# Patient Record
Sex: Female | Born: 1945 | Race: White | Hispanic: No | Smoking: Never smoker
Health system: Southern US, Community
[De-identification: ages and names within clinical notes are randomized; demographics above are authoritative.]

## PROBLEM LIST (undated history)

## (undated) DIAGNOSIS — Z87442 Personal history of urinary calculi: Secondary | ICD-10-CM

## (undated) DIAGNOSIS — Z973 Presence of spectacles and contact lenses: Secondary | ICD-10-CM

## (undated) DIAGNOSIS — Z9289 Personal history of other medical treatment: Secondary | ICD-10-CM

## (undated) DIAGNOSIS — Z9221 Personal history of antineoplastic chemotherapy: Secondary | ICD-10-CM

## (undated) DIAGNOSIS — M1990 Unspecified osteoarthritis, unspecified site: Secondary | ICD-10-CM

## (undated) DIAGNOSIS — Z86711 Personal history of pulmonary embolism: Secondary | ICD-10-CM

## (undated) DIAGNOSIS — G5601 Carpal tunnel syndrome, right upper limb: Secondary | ICD-10-CM

## (undated) DIAGNOSIS — I2510 Atherosclerotic heart disease of native coronary artery without angina pectoris: Secondary | ICD-10-CM

## (undated) DIAGNOSIS — I350 Nonrheumatic aortic (valve) stenosis: Secondary | ICD-10-CM

## (undated) DIAGNOSIS — Z8673 Personal history of transient ischemic attack (TIA), and cerebral infarction without residual deficits: Secondary | ICD-10-CM

## (undated) DIAGNOSIS — I38 Endocarditis, valve unspecified: Secondary | ICD-10-CM

## (undated) DIAGNOSIS — I10 Essential (primary) hypertension: Secondary | ICD-10-CM

## (undated) DIAGNOSIS — Z86718 Personal history of other venous thrombosis and embolism: Secondary | ICD-10-CM

## (undated) DIAGNOSIS — Z8543 Personal history of malignant neoplasm of ovary: Secondary | ICD-10-CM

## (undated) DIAGNOSIS — I5189 Other ill-defined heart diseases: Secondary | ICD-10-CM

## (undated) HISTORY — PX: TONSILLECTOMY: SUR1361

## (undated) HISTORY — PX: VENTRAL HERNIA REPAIR: SHX424

## (undated) HISTORY — PX: UMBILICAL HERNIA REPAIR: SHX196

## (undated) HISTORY — PX: TRANSTHORACIC ECHOCARDIOGRAM: SHX275

---

## 1999-06-03 ENCOUNTER — Ambulatory Visit (HOSPITAL_BASED_OUTPATIENT_CLINIC_OR_DEPARTMENT_OTHER): Admission: RE | Admit: 1999-06-03 | Discharge: 1999-06-03 | Payer: Self-pay

## 1999-06-03 ENCOUNTER — Encounter (INDEPENDENT_AMBULATORY_CARE_PROVIDER_SITE_OTHER): Payer: Self-pay | Admitting: *Deleted

## 2001-02-16 HISTORY — PX: TOTAL ABDOMINAL HYSTERECTOMY W/ BILATERAL SALPINGOOPHORECTOMY: SHX83

## 2001-08-02 ENCOUNTER — Other Ambulatory Visit: Admission: RE | Admit: 2001-08-02 | Discharge: 2001-08-02 | Payer: Self-pay | Admitting: *Deleted

## 2001-08-04 ENCOUNTER — Inpatient Hospital Stay (HOSPITAL_COMMUNITY): Admission: RE | Admit: 2001-08-04 | Discharge: 2001-08-09 | Payer: Self-pay | Admitting: *Deleted

## 2001-08-04 ENCOUNTER — Encounter (INDEPENDENT_AMBULATORY_CARE_PROVIDER_SITE_OTHER): Payer: Self-pay

## 2001-08-04 ENCOUNTER — Encounter (INDEPENDENT_AMBULATORY_CARE_PROVIDER_SITE_OTHER): Payer: Self-pay | Admitting: *Deleted

## 2001-11-04 ENCOUNTER — Encounter: Payer: Self-pay | Admitting: Cardiovascular Disease

## 2001-11-04 ENCOUNTER — Observation Stay (HOSPITAL_COMMUNITY): Admission: EM | Admit: 2001-11-04 | Discharge: 2001-11-05 | Payer: Self-pay | Admitting: Emergency Medicine

## 2002-02-16 DIAGNOSIS — Z8673 Personal history of transient ischemic attack (TIA), and cerebral infarction without residual deficits: Secondary | ICD-10-CM

## 2002-02-16 HISTORY — DX: Personal history of transient ischemic attack (TIA), and cerebral infarction without residual deficits: Z86.73

## 2003-07-17 ENCOUNTER — Ambulatory Visit (HOSPITAL_COMMUNITY): Admission: RE | Admit: 2003-07-17 | Discharge: 2003-07-17 | Payer: Self-pay | Admitting: Emergency Medicine

## 2003-07-17 ENCOUNTER — Observation Stay (HOSPITAL_COMMUNITY): Admission: EM | Admit: 2003-07-17 | Discharge: 2003-07-18 | Payer: Self-pay | Admitting: Neurology

## 2003-07-17 ENCOUNTER — Encounter (INDEPENDENT_AMBULATORY_CARE_PROVIDER_SITE_OTHER): Payer: Self-pay | Admitting: Cardiology

## 2003-07-17 ENCOUNTER — Encounter: Payer: Self-pay | Admitting: Neurology

## 2003-12-19 ENCOUNTER — Ambulatory Visit: Payer: Self-pay | Admitting: Oncology

## 2004-06-12 ENCOUNTER — Ambulatory Visit: Payer: Self-pay | Admitting: Oncology

## 2004-12-11 ENCOUNTER — Ambulatory Visit: Payer: Self-pay | Admitting: Oncology

## 2004-12-24 ENCOUNTER — Ambulatory Visit (HOSPITAL_COMMUNITY): Admission: RE | Admit: 2004-12-24 | Discharge: 2004-12-24 | Payer: Self-pay | Admitting: Oncology

## 2005-06-11 ENCOUNTER — Ambulatory Visit: Payer: Self-pay | Admitting: Oncology

## 2005-06-12 LAB — CBC WITH DIFFERENTIAL/PLATELET
BASO%: 0.4 % (ref 0.0–2.0)
EOS%: 4.1 % (ref 0.0–7.0)
HCT: 37.8 % (ref 34.8–46.6)
LYMPH%: 41.6 % (ref 14.0–48.0)
MCH: 31.6 pg (ref 26.0–34.0)
MCHC: 34.1 g/dL (ref 32.0–36.0)
MCV: 92.7 fL (ref 81.0–101.0)
MONO%: 8 % (ref 0.0–13.0)
NEUT%: 45.9 % (ref 39.6–76.8)
lymph#: 1.6 10*3/uL (ref 0.9–3.3)

## 2005-06-12 LAB — COMPREHENSIVE METABOLIC PANEL
ALT: 24 U/L (ref 0–40)
AST: 22 U/L (ref 0–37)
Alkaline Phosphatase: 68 U/L (ref 39–117)
Chloride: 108 mEq/L (ref 96–112)
Creatinine, Ser: 0.7 mg/dL (ref 0.4–1.2)
Total Bilirubin: 0.6 mg/dL (ref 0.3–1.2)

## 2006-01-01 ENCOUNTER — Ambulatory Visit: Payer: Self-pay | Admitting: Oncology

## 2006-01-05 LAB — CBC WITH DIFFERENTIAL/PLATELET
BASO%: 0.4 % (ref 0.0–2.0)
MCHC: 34.6 g/dL (ref 32.0–36.0)
MONO#: 0.3 10*3/uL (ref 0.1–0.9)
RBC: 4.26 10*6/uL (ref 3.70–5.32)
WBC: 4.6 10*3/uL (ref 3.9–10.0)
lymph#: 1.9 10*3/uL (ref 0.9–3.3)

## 2006-01-05 LAB — CA 125: CA 125: 8.4 U/mL (ref 0.0–30.2)

## 2006-01-05 LAB — COMPREHENSIVE METABOLIC PANEL
ALT: 24 U/L (ref 0–35)
AST: 20 U/L (ref 0–37)
Calcium: 9.7 mg/dL (ref 8.4–10.5)
Chloride: 105 mEq/L (ref 96–112)
Creatinine, Ser: 0.66 mg/dL (ref 0.40–1.20)
Potassium: 3.7 mEq/L (ref 3.5–5.3)

## 2006-03-02 ENCOUNTER — Inpatient Hospital Stay (HOSPITAL_COMMUNITY): Admission: RE | Admit: 2006-03-02 | Discharge: 2006-03-05 | Payer: Self-pay | Admitting: General Surgery

## 2006-07-13 ENCOUNTER — Ambulatory Visit: Payer: Self-pay | Admitting: Oncology

## 2006-07-15 LAB — CBC WITH DIFFERENTIAL/PLATELET
Basophils Absolute: 0 10*3/uL (ref 0.0–0.1)
Eosinophils Absolute: 0.3 10*3/uL (ref 0.0–0.5)
HCT: 39.4 % (ref 34.8–46.6)
HGB: 14 g/dL (ref 11.6–15.9)
LYMPH%: 40.3 % (ref 14.0–48.0)
MCV: 90.3 fL (ref 81.0–101.0)
MONO#: 0.4 10*3/uL (ref 0.1–0.9)
MONO%: 7.2 % (ref 0.0–13.0)
NEUT#: 2.5 10*3/uL (ref 1.5–6.5)
Platelets: 290 10*3/uL (ref 145–400)
WBC: 5.2 10*3/uL (ref 3.9–10.0)

## 2006-07-15 LAB — COMPREHENSIVE METABOLIC PANEL
Albumin: 4.4 g/dL (ref 3.5–5.2)
Alkaline Phosphatase: 70 U/L (ref 39–117)
BUN: 14 mg/dL (ref 6–23)
CO2: 26 mEq/L (ref 19–32)
Glucose, Bld: 100 mg/dL — ABNORMAL HIGH (ref 70–99)
Total Bilirubin: 0.6 mg/dL (ref 0.3–1.2)
Total Protein: 7 g/dL (ref 6.0–8.3)

## 2006-07-15 LAB — CA 125: CA 125: 6.4 U/mL (ref 0.0–30.2)

## 2006-07-15 LAB — LACTATE DEHYDROGENASE: LDH: 212 U/L (ref 94–250)

## 2007-02-17 HISTORY — PX: LUMBAR FUSION: SHX111

## 2007-04-24 ENCOUNTER — Encounter: Admission: RE | Admit: 2007-04-24 | Discharge: 2007-04-24 | Payer: Self-pay | Admitting: Emergency Medicine

## 2007-06-03 ENCOUNTER — Inpatient Hospital Stay (HOSPITAL_COMMUNITY): Admission: RE | Admit: 2007-06-03 | Discharge: 2007-06-06 | Payer: Self-pay | Admitting: Neurosurgery

## 2007-11-20 IMAGING — CT CT ABD-PELV W/O CM
3 of 8 series · 12 of 42 positions shown, 18 images · IV contrast (CONTRAST)
Comparison: NONE

CLINICAL DATA: Abdominal pain and swelling.  Back pain. 

CT ABDOMEN AND PELVIS WITHOUT AND WITH INTRAVENOUS AND FOLLOWING 
ORAL  CONTRAST
TECHNIQUE: Multiple axial 5-millimeter thick slices at 
5-millimeter intervals were obtained from the lung base through 
the pelvis following the intravenous administration of 100 cc of 
Optiray 350 at a rate of 3 cc per second.  Oral contrast was 
administered as well.  Arterial and venous phase imaging was 
obtained in the upper abdomen with delayed images obtained through 
the pelvis.

[Series 4: venous · axial · portal-venous · 0.77mm/px · z∈[+574,+854]mm · 5 of 85 slices shown, 10 images]
[im 15/85  soft-tissue]
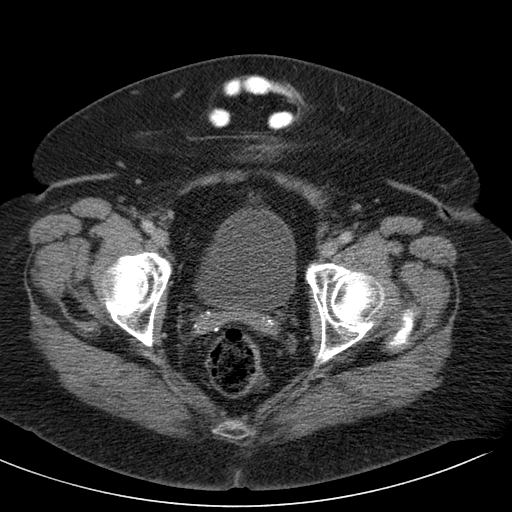
[im 15/85  bone]
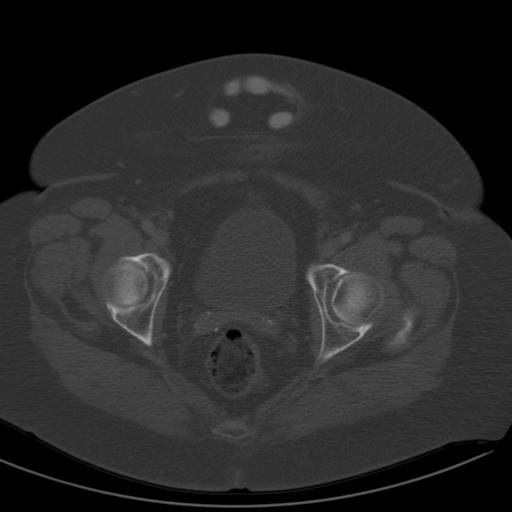
[im 29/85  soft-tissue]
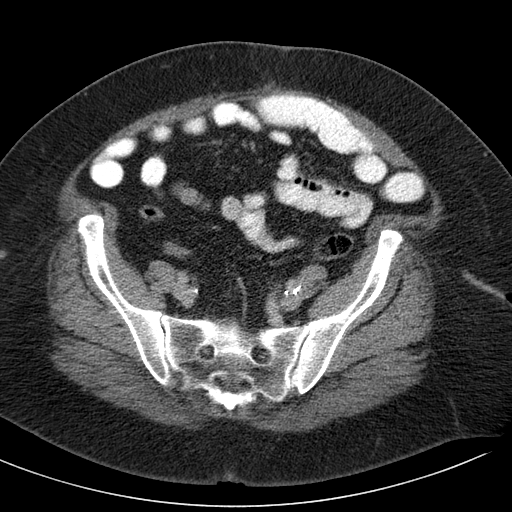
[im 29/85  lung]
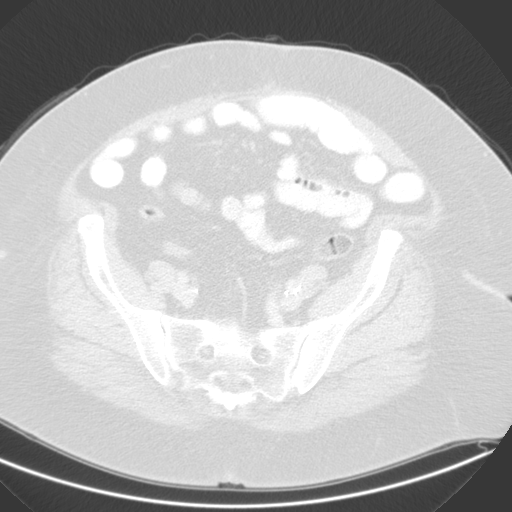
[im 43/85  soft-tissue]
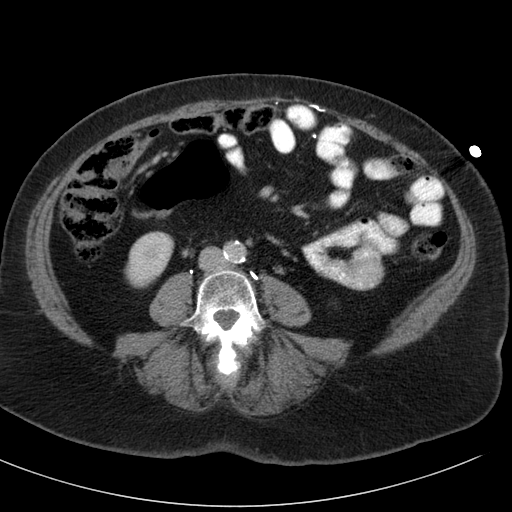
[im 43/85  lung]
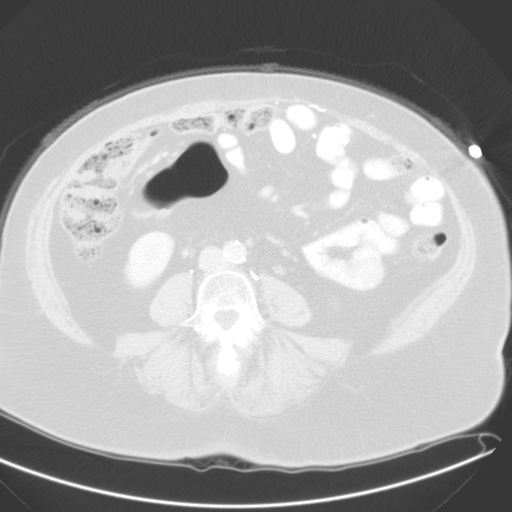
[im 57/85  soft-tissue]
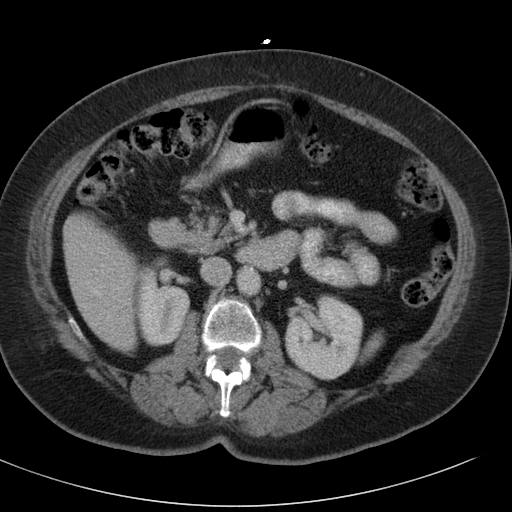
[im 57/85  lung]
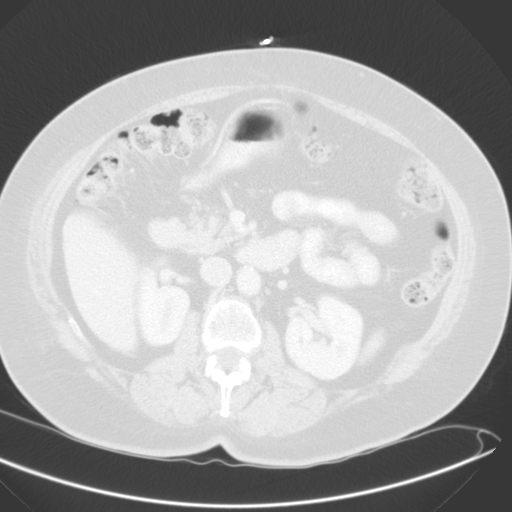
[im 71/85  soft-tissue]
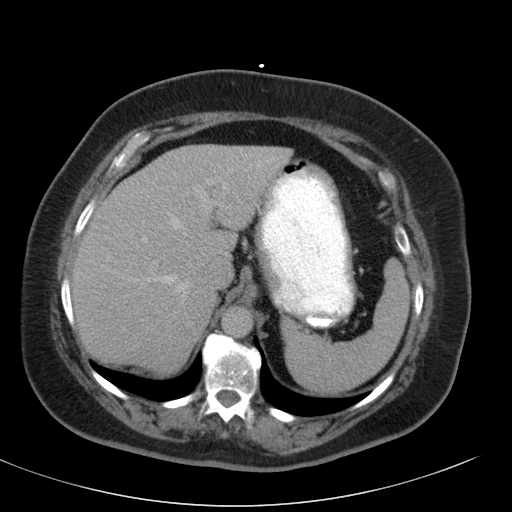
[im 71/85  lung]
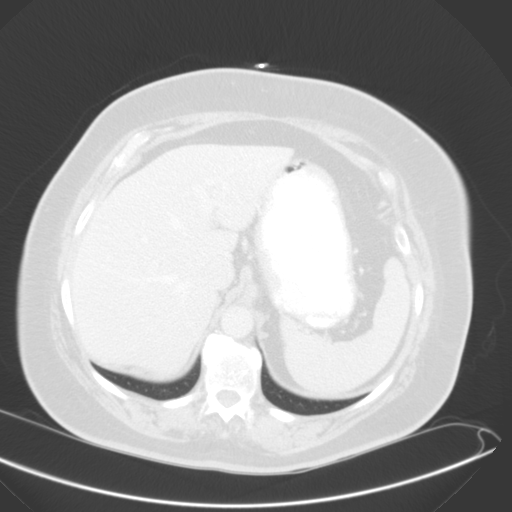

[Series 9: bladder delays · axial · 0.76mm/px · z∈[+606,+801]mm · 4 of 67 slices shown]
[im 14/67  soft-tissue]
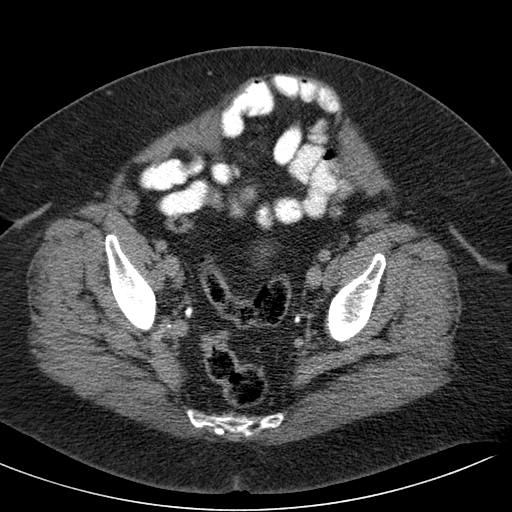
[im 27/67  soft-tissue]
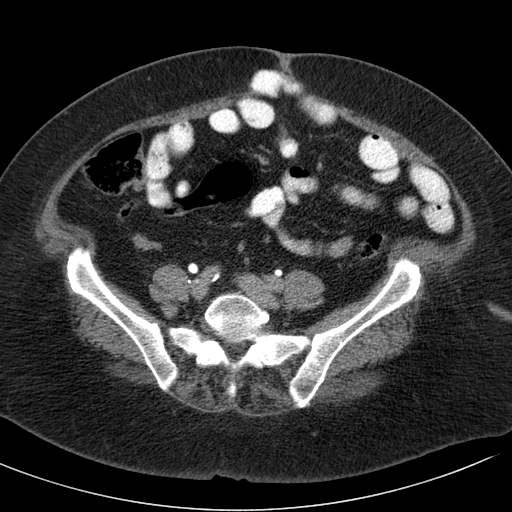
[im 40/67  soft-tissue]
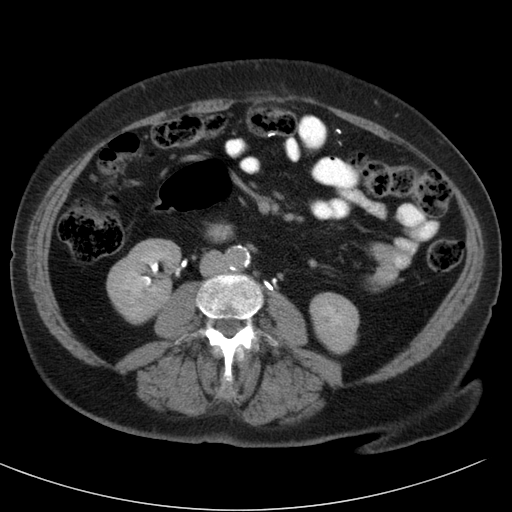
[im 53/67  soft-tissue]
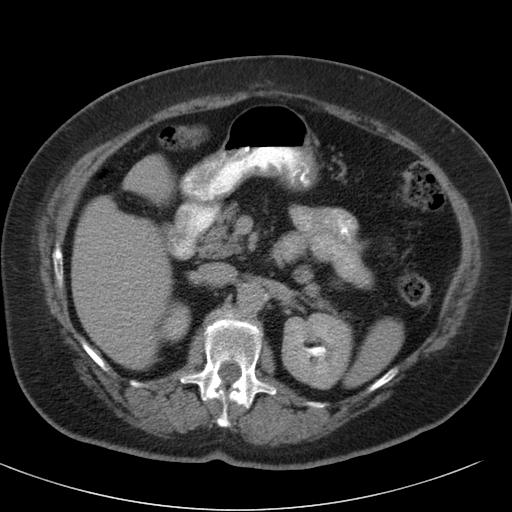

[Series 8058: coronals · coronal · 0.82mm/px · 3 of 95 slices shown, 4 images]
[im 32/95  soft-tissue]
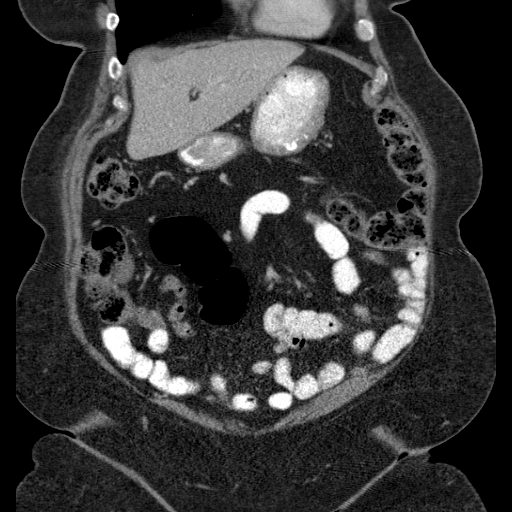
[im 42/95  soft-tissue]
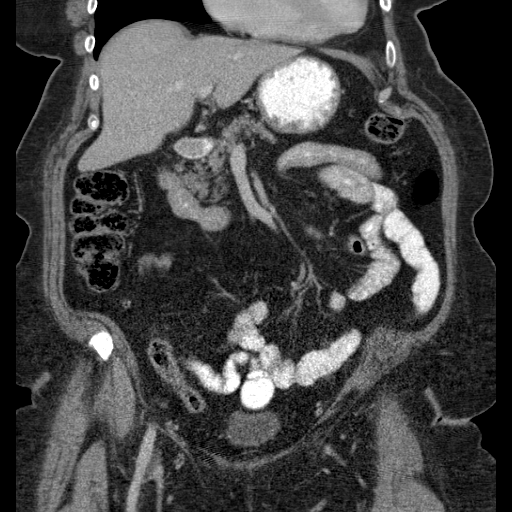
[im 42/95  bone]
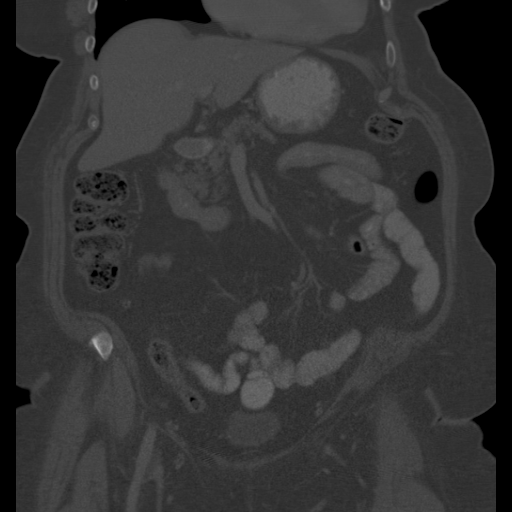
[im 53/95  soft-tissue]
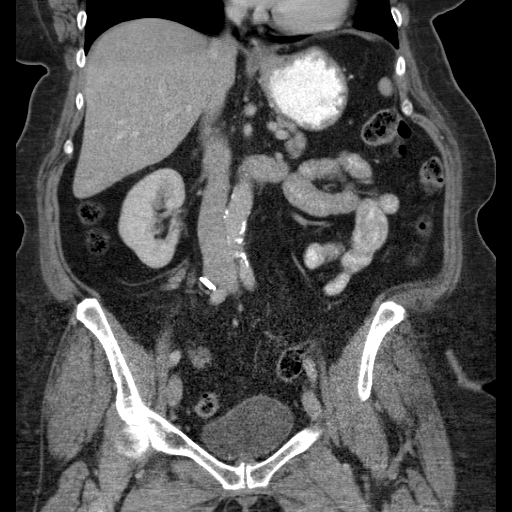

[12 of 42 positions shown; findings below may reference images not displayed]

FINDINGS: Lung bases are clear.  No acute bony changes are 
demonstrated.  Mild facet arthropathy is noted in the distal 
lumbar spine. The abdominal wall shows 3 separate areas of ventral 
herniation.  Above the umbilicus is an area of ventral hernia 
about 5 cm wide containing fat and a small amount of transverse 
colon.  A second umbilical hernia is about 5 cm wide and contains 
a loop of small bowel and omental fat.  A third ventral hernia 
inferiorly extends further inferiorly into the abdominal wall and 
pelvic pannus.  It has a width of about 10 cm and extends 
inferiorly for about 10-15 cm, containing several loops of small 
bowel and omental fat.  There is no evidence for obstruction or 
strangulation with any of the involved bowel. The gallbladder is 
surgically absent.  The liver, pancreas, and spleen appear normal. 
The kidneys show symmetrical excretion.  No solid renal masses, 
stones, or hydronephrosis are evident.  The ureters are normal in 
course and caliber.  Urinary bladder appears normal. The uterus 
and ovaries are not visible, presumably absent.  The large and 
small bowel loops show no mass, obstruction, or inflammation.  A 
normal appendix lies inferior to the cecum.  No evidence for 
inflammatory bowel disease or diverticulitis.
IMPRESSION: Post-op changes.  Anterior abdominal wall hernias at 
several locations as described.  No acute abnormality. Savio Locklear 
01/27/2006  Tran Date: 01/28/2006 [facility]  JLM

## 2010-07-01 NOTE — Op Note (Signed)
NAMETANETTA, FUHRIMAN                   ACCOUNT NO.:  [id number]   MEDICAL RECORD NO.:  [id number]          PATIENT TYPE:  INP   LOCATION:  3003                         FACILITY:  [facility]   PHYSICIAN:  Hilda Lias, M.D.   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   PROCEDURE:  L4 Gill procedure, bilateral total diskectomy, interbody  fusion with cages, pedicle screws L4-L5, posterolateral arthrodesis with  autograft, and BMP, cell saver, C-arm.   SURGEON:  Hilda Lias, M.D.   ASSISTANT:  Dr. Lovell Sheehan.   CLINICAL HISTORY:  The patient is being admitted because of back pain  that rose to both legs.  The patient has been complaining of pain and  weakness on walking.  X-rays reveal spondylolisthesis with stenosis at  L4-L5.  Surgery was advised, and the risks were explained in the history  and physical.   PROCEDURE:  The patient was taken to the OR and after intubation, the  back was cleaned with DuraPrep.  Midline incisional x-ray was taken,  which showed that we were below the L4-L5.  From then on midline  incision was made through the skin through a thick adipose tissue down  to the lumbar area.  We identified the L5-S1 space and we went one space  above to identify the L4-L5 space.  Another x-ray showed indeed we were  at the L4-L5.  From then on we proceeded with removal of the spinous  process and lamina of the facet of L4.  The patient has a thick  calcified yellow ligament.  The dural sac was quite narrow.  Decompression was made and after the lysis of adhesion, we were able to  mobilize the dural sac and  entered the disk space with total bilateral  gross diskectomy was achieved.  Then we introduced 2 cages 12 x 22 with  a BMP anteriorly and autograft in the medial and posterior.  Those 2  cages were centered.  AP and lateral showed good position of the bone  cages.  From then on with the C-arm, we identified the pedicle at L4 and  L5 and we introduced 4 screws of 5.5 x 45, which were kept in place  using a rod with caps.  Then, we went lateral and we did the lateral  aspect of the facet and a mix of BMP and autograft was used for  arthrodesis.  From then on, the wound was closed with Vicryl and Steri-  Strips.  The patient will be sent to the recovery room.           ______________________________  Hilda Lias, M.D.     EB/MEDQ  D:  06/03/2007  T:  06/04/2007  Job:  [id number]

## 2010-07-04 NOTE — Op Note (Signed)
[facility]  Patient:    Hailey Shields, Hailey Shields Visit Number: [id number] MRN: [id number]          Service Type: GYN Location: 4W [id number] [id number] Attending Physician:  Marin Comment Dictated by:   Pershing Cox, M.D. Proc. Date: 08/04/01 Admit Date:  08/04/2001   CC:         Genia Del, M.D.  Mardene Celeste Lurene Shadow, M.D.  Elgie Congo, M.D., [facility]   Operative Report  PREOPERATIVE DIAGNOSES: 1. Complex pelvic abdominal mass. 2. Symptomatic gallstones.  POSTOPERATIVE DIAGNOSES: 1. 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 3. Rule out ovarian cancer.  PROCEDURE: 1. Exploratory laparotomy 2. Lysis of adhesions. 3. Total abdominal hysterectomy. 4. Bilateral salpingo-oophorectomy. 5. Omentectomy. 6. Pelvic lymphadenectomy.  SURGEON:  Pershing Cox, M.D.  ASSISTANT SURGEON:  Genia Del, M.D.  INDICATIONS FOR PROCEDURE:  This 65 year old woman has a history of intermittent bowel pressure and pain over the last two years.  She experienced postmenopausal bleeding and was seen by Dr. Cleta Alberts at [facility].  He obtained a CT scan and an ultrasound of her pelvis, both showing complex pelvic mass suggestive of ovarian neoplasm which was compressing both the uterus and the bladder.  CA-125 was 86.  Prior to surgery, the patient was counseled regarding the planned procedure.  All of the risks were explained, and she accepted the risks of this procedure.  She was also seen in consultation by Dr. Dominga Ferry prior to surgery, who has agreed to perform a cholecystectomy at the time of our exploratory laparotomy which was removed for symptomatic gallstones.  She underwent extensive bowel preparation prior to surgery and received IV antibiotics prior to her surgical procedure.  OPERATIVE FINDINGS:  The patient had a hernia sac present in the anterior abdominal wall, consistent with  recurrence or entrapment of her previous umbilical hernia repair.  She had a 20 cm mass oblong in character, both cystic and solid, arising from the left ovary.  This mass was densely adherent to the cul-de-sac of Douglas and to the posterior surface of the uterus and cervix.  It could not be separated from the uterus and, for this reason, an unblocked dissection was required to remove the pelvic mass rather than a routine hysterectomy.  The patients right fallopian tube and ovary were elongated but otherwise normal.  The cervix was elongated.  There was no evidence of ascites.  There were no palpable lymph nodes in the pelvis or the periaortic area.  The omentum was normal in texture.  There were no peritoneal implants.  DESCRIPTION OF PROCEDURE:  The patient was brought to the operating room with an IV in place.  She had received 1 g of Ancef in the holding area.  She had PAS stockings placed on her lower extremities.  She was placed supine on the OR table, and IV sedation was administered.  She was then intubated without difficulty.  She was placed in a frogleg position, and the anterior abdominal wall, perineum, and vagina were prepped with a solution of Hibiclens.  Foley catheter was inserted sterilely into the bladder.  She was placed again in a supine position and draped for a midline incision.  Marcaine 0.25% was injected into the midline circumferentially around the right side of the umbilicus, extending nearly to the xiphoid.  We began our incision just above the umbilicus, carrying it down to the skin and to the dermis.  Just lateral to the  umbilicus, a soft, fleshy, cystic area was noted which appeared to be a loop of bowel.  For this reason, the incision was extended above the previous incision, carried down through the skin until the fascia was encountered.  The fascia was opened, peritoneum was scored, and I entered above the umbilicus approximately 8 cm.  From this  perspective, I could lift the anterior abdominal wall and continue the dissection down.  There was no evidence of bowel coming through the anterior abdominal wall.  Later in the operative procedure, when we were able to dissect this out, this was an entrapped cystic area, probably residual hernia sac from her previous umbilical hernia repair. The skin incision, subcutaneous incision, and fascial incisions were extended down to the bottom of her pannus.  I was able to avoid making the incision into the area where she had had previous inflammation suggestive of a yeast dermatitis.  Anterior abdominal walls were lifted, and 500 cc of warm saline were instilled and retrieved for peritoneal washings.  Next, upper abdominal exploration was performed.  I then ran the bowel and palpated the lymph node areas, looking for any signs of enlarged lymph node.  At this point, the bowel was packed into the gutters as best as possible.  Bookwalter retractor was placed, and retractors were used to give [place] exposure over the area of the upper abdomen/mid pelvis.  The mass that we were after was arising from the left ovary.  It was both cystic and solid, and many of the cystic areas were very thin-walled.  The mass was densely adherent to the posterior surface of the uterus.  It was impossible by either approaching from the sides or from the top of the fundus to find a space between the ovary and the uterus.  At this point, we began by identifying the left round ligament.  This was ligated and transected.  Peritoneum lateral to the IP ligament was incised. The IP ligament was reflected laterally, and we were then able to palpate the ureter.  The clear space beneath the IP ligament was identified and punctured. The IP ligament was clamped doubly, cut, suture, and free-tie ligated.  We dissected this pedicle for a distance to lift it off of the lateral sidewall.  With this ligated, we then began to try to lift  the mass out of the pelvis. There was a very stiff band of tissue at the base where the ovary was adjacent to the descending rectosigmoid.  In an attempt to expose this band of tissue, one of the cystic areas was ruptured.  The fluid was suctioned.  Attempt was made to try to contain the liquid without exposing the entire peritoneal cavity, but we were unsuccessful.  This fluid was straw-colored and measured over a liter by the time the fluid had been completely drained.  With the cystic areas of the mass decompressed, we were able to see the band beneath and, after verifying that both ureters were safely distant from this, it was able to be incised.  We were then able to lift the posterior portion of the mass; however, it was still impossible to separate it from the posterior wall of the uterus.  At this point, we decided to perform a hysterectomy with en bloc resection of this ovarian mass.  Right round ligament was identified, suture ligated, and transected.  The peritoneum lying along the ovarian vessels was incised.  Vessels were reflected medially, and the ureter was visualized.  The IP ligament  was then safely clamped, cut, suture, and free-tie ligated.  The posterior broad ligament was incised bringing the ovary close to the body of the uterus.  The uterine artery was skeletonized on the right side.  The peritoneum over the lower uterine segment was incised, and the bladder was dissected free from the cervix.  Posteriorly, a small amount of dissection was done to free the rectosigmoid from the posterior surface of the uterus so that the uterine arteries could be safely clamped.  This was clamped, cut, and then Heaney-ligated.  A second clamp was placed along the uterine artery which was then doubly tied using the second stitch around the first stitch.  Straight clamps were used to separate the cervix from the cardinal ligament. Approximately four bites were necessary in order to  make the separation as the cervix was quite elongated.  Dissection on the right was very similar to that on the left.  Curved Zeppelin clamps were used to clamp beneath the cervix, and the cervix was incised from the vagina.  There was a small amount of adhesive disease still from the rectosigmoid to the surface of the uterus. Using care, Metzenbaum scissors were used to free this before the entire specimen was lifted from the pelvis.  Angled stitches were placed along the vaginal angles, and the anterior and posterior portions of the vagina were whipped using a running locking 0 Vicryl stitch.  The vaginal walls were closed front to back using two 0 Vicryl sutures.  Hemostasis was excellent at this point.  Report from the pathologist regarding the frozen section showed a tumor which was primarily borderline, perhaps mucinous, arising in a cystic teratoma. There were areas suggestive but no diagnostic of ovarian cancer.  Final pathology will be necessary to resolve this question.  Because of this borderline diagnosis, a decision was made to proceed with ovarian cancer staging.  Our staging was somewhat limited by the patients extreme obesity and the large amount of fat in her retroperitoneum.  Where exposure was adequate, the sampling was complete where the exposure was inadequate. Attempts were limited or abandoned.  Using an LDS stapler, the gastrocolic omentum was separated from the transverse colon.  The upper layer was removed first by sharp dissection with cautery and Metzenbaum scissors.  The deeper layer of omentum was then separated using the LDS stapler.  This was submitted as a single specimen.  Portions of peritoneum were incised from both gutters. These were sent for permanent section.  Lymph node sampling was performed on both sides of the pelvis.  The external iliac artery was exposed to the level of the circumflex vein.  Hypogastric artery was clearly visualized, and  there was very little nodal tissue.  There were lots of areas of fat around the arteries and in the retroperitoneum, but there was very little lymph node tissue present.  What was visible was removed from the top of the external iliac and hypogastric artery.  The artery was then separated from the vein, and the tissue obtained between these two vessels was also submitted.  Moving up the common iliac artery, a bundle of tissue was taken from the lateral surface of the iliac artery on the patients left.  We attempted to gain exposure to perform a periaortic node dissection on the left.  This was abandoned because of extreme difficulty with exposure. Given the uncertain diagnosis, I did not push any harder.  I did carefully palpate these areas, and there was certainly no evidence of  palpable lymph node, either on the left or the right side of the periaortic chains.  On the patients right, there was almost no nodal tissue to sample.  I did remove the fat pad along the external iliac artery and right common iliac artery.  Both arteries were separated from the psoas muscle and carefully palpated.  Both arteries were separated from the vein completely, and the vein and hypogastric vessels completely exposed.  No tissue was palpated posterior or inferior to the external iliac vein.  On the patients right, the peritoneum lateral to the ascending colon was completely incised, and the colon was reflected medially so that I could carefully visualize the inferior vena cava and palpate this area.  There was just nothing to sample in this area.  Drains were not placed because of the limited nature of the dissection.  At this point, Dr. Dominga Ferry came into the operating room and performed an open cholecystectomy.  Please see notes related to his procedure which will be dictated separately.  Once the cholecystectomy was completed, we inspected the pelvis again carefully and found no evidence of  bleeding.  Three liters of warm saline were used to irrigate the peritoneal cavity vigorously, hoping to retrieve any floating malignant cells which might have escaped from the ovary during our  dissection and rupture the cyst.  The rectosigmoid was carefully layered into the pelvis.  Small bowel was layered on top of it, and the transverse colon was drawn down over the top of this.  Inspection of the anterior abdominal wall showed the area of retained hernia sac.  This was carefully dissected from the subcutaneous tissues.  The fascia was freshened.  The fascia was closed with double-stranded 0 Prolene sutures, beginning each stitch at the upper or lower portion of the incision and running them to the middle of the incision where the stitch was inverted.  Subcutaneous tissues were irrigated and made hemostatic.  Skin staples were applied.  Estimated blood loss 750 cc.  Urine output 600 cc.  Fluid 5250 cc.  Specimens included peritoneal washings, en bloc resection of the uterus, complex left adnexal mass, and right fallopian tube and ovary, omentum, left pelvic lymph nodes, and right pelvic lymph nodes and, with Dr. Lurene Shadow, the gallbladder. Dictated by:   Pershing Cox, M.D. Attending Physician:  Marin Comment DD:  08/04/01 TD:  08/06/01 Job: [id number]

## 2010-07-04 NOTE — Op Note (Signed)
. [facility]  Patient:    Hailey Shields,                                 MRN: [id number] Proc. Date: 06/03/99 Attending:  Milus Mallick, M.D.                           Operative Report  NO DICTATION DD:  06/03/99 TD:  06/03/99 Job: [id number]

## 2010-07-04 NOTE — Op Note (Signed)
. [facility]  Patient:    Hailey Shields, Hailey Shields                            MRN: [id number] Proc. Date: 06/03/99 Adm. Date:  06/03/99 Attending:  Milus Mallick, M.D.                           Operative Report  NO DICTATION PROVIDED DD:  06/03/99 TD:  06/03/99 Job: [id number]

## 2010-07-04 NOTE — Discharge Summary (Signed)
Hailey Shields, Hailey Shields                   ACCOUNT NO.:  [id number]   MEDICAL RECORD NO.:  [id number]          PATIENT TYPE:  INP   LOCATION:  3003                         FACILITY:  [facility]   PHYSICIAN:  Hilda Lias, M.D.   DATE OF BIRTH:  09-13-1945   DATE OF ADMISSION:  06/03/2007  DATE OF DISCHARGE:  06/06/2007                               DISCHARGE SUMMARY   ADMISSION DIAGNOSIS:  L4-L5 spondylolisthesis with chronic  radiculopathy.   FINAL DIAGNOSIS:  L4-L5 spondylolisthesis with chronic radiculopathy.   CLINICAL HISTORY:  The patient was admitted because of back pain  radiating to both legs.  X-rays showed that she had stenosis at the L4-  L5 and surgery was advised.   LABORATORY:  Normal.   HOSPITAL COURSE:  The patient was taken to surgery on April 17 and L4-L5  fusion was accomplished.  The patient did well up to the point that 3  days later she was ready to go home.  She was discharged by Dr. Lovell Sheehan  and there is a formal written discharge in the chart.   CONDITION ON DISCHARGE:  Improvement.   MEDICATIONS:  1. Percocet.  2. Diazepam.   DIET:  Regular.   ACTIVITY:  She is not to drive until she sees me.           ______________________________  Hilda Lias, M.D.     EB/MEDQ  D:  06/28/2007  T:  06/29/2007  Job:  [id number]

## 2010-07-04 NOTE — Cardiovascular Report (Signed)
NAME:  Hailey Shields, Hailey Shields                             ACCOUNT NO.:  [id number]   MEDICAL RECORD NO.:  [id number]                   PATIENT TYPE:  INP   LOCATION:  1824                                 FACILITY:  [facility]   PHYSICIAN:  Richard A. Alanda Amass, M.D.          DATE OF BIRTH:  [birth year]   DATE OF PROCEDURE:  11/04/2001  DATE OF DISCHARGE:                              CARDIAC CATHETERIZATION   PROCEDURE:  Retrograde central aortic catheterization, selective coronary  angiography by Judkins technique, left ventricular angiogram RAO and LAO  projections, intracoronary nitroglycerin administration, left ventricular  angiogram, RAO and LAO projections, abdominal aortic angiogram, hand  injection mid stream.   DESCRIPTION OF PROCEDURE:  The patient was brought to the second floor CP  Lab in the postabsorptive state after premedication with 5 mg of Valium p.o.  Heparin was held.  She was given 2 mg of Versed for sedation.  During the  procedure she was given 5 mg of Lopressor IV for sinus tachycardia.  Catheterization was done through a 6 [place] short Daig side-arm sheath that  was placed in the CRFA using modified Seldinger technique with a single  anterior puncture using an 18 thin wall needle.  IC nitroglycerin, 200 mcg  was given at the left coronary artery with repeat injections obtained.  Following coronary angiography, LV angiogram was done in the RAO and LAO  projections at 25 cc 14 cc/sec., 20 cc 12 cc/sec. respirations.  Pullback  pressure of the CA was performed that showed no gradient across the aortic  valve. Abdominal angiogram was done in the mid stream PA projection by hand  injection, demonstrating normal single renal arteries bilaterally, normal  infrarenal abdominal aorta.   Hand injection of the left subclavian revealed a normal subclavian, normal  left vertebral, normal LIMA.   Catheter was removed, side-arm sheath was flushed.  ACT was less than 150.  The patient  was transferred to the holding area for sheath removal and  pressure hemostasis. She tolerated the procedure well and had intact right  lower extremity pulses at the end of the procedure.   PRESSURES:  LV:  Initially blood pressures were 160-170, post IC  nitroglycerin 140/0; LVEDP 16 mmHg.  CA:  140/74 mmHg.   FLUOROSCOPY:  Fluoroscopy showed minimal +1 calcification at the proximal  LAD and right coronary. There was no intracardiac or valvular calcification.   LEFT VENTRICULAR ANGIOGRAM:  The left ventricular angiogram in the RAO and  LAO projection showed normal contracting left ventricle, EF greater than  550% with no mitral regurgitation.  No segmental wall motion abnormality.   The main left coronary was normal.   The left anterior descending artery had an eccentric 50-60% (closer to 60%)  lesion just beyond the first diagonal and just before the first septal  perforator. There was segmental disease of the LAD with about 40% narrowing  proximal to the first diagonal.  The plaque was eccentric to the ostia of the  diagonal #1, smooth with good flow. The remainder of the LAD coursed near  the apex of the heart and had no significant stenosis.   The first diagonal branch arising from the eccentric LAD lesion showed no  significant stenosis, was of small to moderate sized and bifurcated.   The second diagonal branch arising from the mid LAD was a large bifurcating  twin diagonal branch as large as the LAD and coursed in the lateral apical  wall.   The circumflex artery was a large codominant vessel giving off the small OM-  1 that was normal and AV groove branch at the bend right in the proximal  third.  A normal sized second marginal and a large bifurcating distal  marginal branch with two PAVG groove branches that were normal.   The right coronary was a dominant vessel and was large. There was  atherosclerotic irregularity and 30-40% segmental narrowing throughout the   proximal third after the atrial and before the RV branch. There was good  flow and there was minimal irregularity in the mid RCA.  The PDA and PLA  were normal.   DISCUSSION:  This very pleasant, 65 year old married white mother of three  with three grandchildren is a non smoker. She recently underwent abdominal  surgery by Dr. Carey Bullocks and Dr. Lurene Shadow (cystectomy) for CT evidence of  possible ovarian tumor. 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  She tolerated surgery well and had associated  cholecystectomy.  She has a prior history of mild GERD but none recently.  She has started on chemotherapy and had three treatments (last one a week  ago) under the care of Dr. Donnie Coffin one day a month.  She takes no regular  medicine except p.r.n. medicine around her chemotherapy which she has  tolerated well.  She has lost her hair.  She has had two episodes of new  onset substernal chest pain radiating to both arms lasting 20 minutes on the  a.m. prior to admission and a second one at work on the day of admission,  both compatible with ischemia.  Initial enzymes were normal with no acute  ECG changes.   The patient has new onset angina.  She has just been through a stressful  period in her life and has had fairly major surgery almost three months ago  which is well-healed.  She does have 50-60% eccentric narrowing  atherosclerotic of the LAD just beyond the first diagonal branch. There is a  lesion that crosses over the diagonal and septal perforator. The remainder  of the coronaries show no significant disease and she has normal LV  function.   In view of her new onset angina which is probably related to her LAD lesion,  I would recommend medical therapy for single vessel disease with borderline  LAD stenosis angiographically.  I would also institute empiric GI therapy  considering the stress the patient has been under with the possibility of reflux and/or esophageal  spasm. Correction of risk factors if possible, such  as obesity (no diabetes), and hyperlipidemia will be investigated. I would  recommend outpatient Cardiolite to assess her further for any anterior  ischemia since she would be a potential candidate for intervention if  clinically necessary in the future.   CATHETERIZATION DIAGNOSES:  1. Chest pain, new onset angina without myocardial infarction.  2. Borderline left anterior descending lesion, eccentric, segmental near  a     diagonal #1 and status post #1 as outlined, probably culprit.  3. Minor atherosclerotic proximal right coronary artery disease.  4. Normal left ventricular function.  5. Recent clear-cell ovarian cancer, treated surgically, June 2003 with     associated cholecystectomy.  Subsequently monthly chemotherapy under the     direction of Dr. Pierce Crane.  6. Exogenous obesity.  7. Hyperlipidemia, lab pending.  8. Mild hypertension.                                                 Richard A. Alanda Amass, M.D.    RAW/MEDQ  D:  11/04/2001  T:  11/07/2001  Job:  [id number]   cc:   Pierce Crane, M.D.  [address] [facility]  [place]  [place] [address]  Fax: [phone]   Brett Canales A. Cleta Alberts, M.D.   Mardene Celeste Lurene Shadow, M.D.  [address], [address]  [place]  [place] [address]  Fax: [phone]   Pershing Cox, M.D.  [address]  [address]  [place]  [place] [address]  Fax: [phone]   [facility]   Cristy Hilts. Jacinto Halim, M.D.

## 2010-07-04 NOTE — Op Note (Signed)
Hailey Shields, Hailey Shields                   ACCOUNT NO.:  [id number]   MEDICAL RECORD NO.:  [id number]          PATIENT TYPE:  INP   LOCATION:  X007                         FACILITY:  [facility]   PHYSICIAN:  Sharlet Salina T. Hoxworth, M.D.DATE OF BIRTH:  10-Feb-1946   DATE OF PROCEDURE:  03/02/2006  DATE OF DISCHARGE:                               OPERATIVE REPORT   PREOPERATIVE DIAGNOSIS:  Ventral incisional hernia.   POSTOPERATIVE DIAGNOSIS:  Ventral incisional hernia.   SURGEON:  Dr. Johna Sheriff   ANESTHESIA:  General.   BRIEF HISTORY:  Hailey Shields is a 65 year old female with morbid obesity  who is status post extensive laparotomy for ovarian cancer in 2003.  She  remains cancer free but has developed intermittent discomfort, bulging,  and swelling in her midline incision, both in the lower and upper  abdomen.  Examination shows a large ventral incisional hernia involving  several areas of her midline incision.  CT scan confirms this diagnosis  with a large defect of the lower abdomen and several smaller defects  along the upper midline.  No other abnormalities were found on the CT.  After extensive discussion of options, we have elected to proceed with  possible laparoscopic versus open repair, depending on the findings.  The nature of the procedure; indications, risks of bleeding, infection,  recurrence, and bowel injury were discussed understood.  She is now  brought to the operating room for this procedure.   DESCRIPTION OF OPERATION:  The patient was brought to the operating  room, placed in supine position on the operating table and general  endotracheal anesthesia was induced.  Foley catheter was placed.  She  was given preoperative antibiotics.  She was given Lovenox  subcutaneously preoperatively.  The abdomen was widely sterilely prepped  and draped.  PAS were placed.  Correct patient and procedure were  verified.  Local anesthesia was used to infiltrate the trocar sites  prior to the  incisions.  In the left flank, 1 cm incision was made and  dissection carried down directly to the anterior fascia which was  incised for 1 cm, the internal oblique bluntly split, the transversus  peritoneum elevated and divided under direct vision, and the free  peritoneal cavity entered.  Through a mattress sutures of 0 Vicryl, the  Hasson trocar was placed and pneumoperitoneum established.  There were  multiple adhesions up to the midline and into the hernias, both omentum  and bowel, but these did not appear particularly severe, and we elected  proceed with a laparoscopic approach.  Two 5 mm trocars were placed in  the extreme left lateral abdomen for dissection.  Using careful  dissection under direct vision with sharp dissection and the harmonic  scalpel, numerous adhesions were taken down from the midline and reduced  from the hernia defects.  This included omentum and small bowel.  The  larger defects in the abdomen did not actually have any adhesions up  into them.  The dissection was fairly prolonged and tedious but  progressed without problem until all adhesions were completely lysed.  The bowel was  then carefully run and examined.  There was no evidence of  bowel injury.  There were, as noted, multiple defects along the midline  with a large defect in the lower abdomen and several smaller defects  into the epigastrium.  In order to cover the entire area, we measured a  piece of mesh 30 x 20 cm was required, and the size of piece of Proceed  dual-sided mesh was chosen.  Eight 0 Prolene stay sutures were placed  circumferentially around the edge of the mesh.  Corresponding small stab  incisions were made in the anterior abdominal wall to bring up the stay  sutures.  Mesh was curled, placed in the abdomen, and unfurled and  oriented.  Using the suture retriever, the stay sutures were brought up  through the anterior abdominal wall and then the mesh brought up with  nice deployment  widely around the defects.  The stay sutures were tied  in place.  The mesh was then tacked circumferentially using the Endo  tacker feeling all the tacks directly through the abdominal wall.  In  order to visualize the left side of the mesh, as this was a large, wide  piece of mesh, I did place two 5 mm trocars in the right flank under  direct vision and then used a 5 mm camera and Endo tacker through the  right side to tack the left side of the mesh.  A second inner row of  tacks was then also placed.  This appeared to widely cover all defects  with secure placement of the mesh.  Following this, CO2 was evacuated,  trocars removed, and the mattress suture was secured in the left flank.  An additional Vicryl suture was used to close this fascial defect as  well.  Skin incisions were then closed with interrupted subcuticular 4-0  Monocryl and Dermabond.  Sponge, needle, and instrument counts were  correct.  Dry sterile dressings were applied and the patient taken to  recovery in good condition.      Lorne Skeens. Hoxworth, M.D.  Electronically Signed     BTH/MEDQ  D:  03/02/2006  T:  03/02/2006  Job:  [id number]

## 2010-07-04 NOTE — Discharge Summary (Signed)
[facility]  Patient:    Hailey Shields, EMPIE Visit Number: [id number] MRN: [id number]          Service Type: GYN Location: [id number] [phone] [id number] Attending Physician:  Marin Comment Dictated by:   Pershing Cox, M.D. Admit Date:  08/04/2001 Discharge Date: 08/09/2001   CC:         Luisa Hart L. Lurene Shadow, M.D.  Pierce Crane, M.D.  Genia Del, M.D.  Dr. Lesle Chris, [facility]   Discharge Summary  ADMITTING DIAGNOSES:  Complex pelvic abdominal mass and symptomatic gallstones.  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  OPERATIVE PROCEDURES:  Exploratory laparotomy with total abdominal hysterectomy, bilateral salpingo-oophorectomy, omentectomy and staging for ovarian carcinoma, open cholecystectomy.  HISTORY OF PRESENT ILLNESS:  For details of the patients history and physical, please see the transcribed note dated August 04, 2001.  Briefly, this patient was referred from Dr. Cleta Alberts at [facility] because of CAT scan findings of a complex abdominal pelvic mass.  The patient had presented with postmenopausal bleeding when she had these studies done.  She received a bowel prep in preparation for this operation and saw Dr. Dominga Ferry in consult because of symptomatic gallstones and the need for extensive incision thinking that it would be best for her to have the cholecystectomy at the same time as our operation.  HOSPITAL COURSE:  The patient was taken to the operating room on the day of admission.  She was found to have a very large complex pelvic abdominal mass which on frozen section turned out to be a borderline tumor arising in the cystic teratoma, final pathology showed this to be a clear cell carcinoma of the ovary.  Because it ruptured during dissection, it is a I-C clear cell carcinoma of the ovary.  Staging for ovarian carcinoma was performed at the time of her surgical procedure.   At the same time Dr. Dominga Ferry came to the operating room and performed an open cholecystectomy because of her multiple gallstones.  The patients postoperative course was relatively uncomplicated.  She was seen the night of her surgery and was having no significant complaints.  Her pain was fairly well controlled.  On the morning of postoperative day #1, the patient began taking Toradol and was able to begin sips of liquids.  She had been in the step-down unit overnight but was transferred to the floor, Foley catheter was discontinued and she was placed on clear liquids.  She had a low-grade temperature that day but as she began using her inspirometer well this temperature was controlled.  Also, she had had some leaking from the lower part of her incision.  The incision was prepped with Betadine and probed and there was no significant seroma.  On postoperative day #2, the patients nausea had resolved.  The night before she had had difficulty with frequent urination and a Foley catheter was reinserted.  The Foley catheter was discontinued again on the morning of postoperative day #2 and did not need to be reinserted.  The patients hemoglobin was stable at 11.  The patient had some problems with pain medication, however, she was able finally to take Darvocet for regular pain medication without significant problems.  She began a full liquid diet on postoperative day #3, she received Dulcolax suppository and the following day had a bowel movement.  She continued intermittently to run a low-grade fever off and on returning to normal temperatures most evaluation  times.  She began a regular diet on postoperative day #4, pathology returned that day consistent with clear cell carcinoma of the ovary stage I-C because of the wall rupture.  The final report is not back on the chart but this is a verbal report.  Because of this information, I requested that she be seen by the medical oncologist.  Dr.  Pierce Crane saw her in consultation on postoperative day #4.  He spoke with her and will be seeing her for postoperative care in approximately 2 weeks.  On the morning of the day of her discharge, postoperative day #5, she was sitting out of bed, eating a regular diet, using her inspirometer well.  Her incision looks fine.  PLANS FOR DISCHARGE:  This patient will be seen in my office in 2 days to have her staples removed.  She was given a discharge instruction sheet about 3 days ago and we have reviewed it.  She understands all of her discharge instructions.  She has a prescription for Darvocet to use for pain relief. She will be seen by Dr. Donnie Coffin in 2 weeks.  She will be seen for postoperative examination in my office in 4-5 weeks.  She knows not to swim in a swimming pool.  She knows to rest and walk frequently. Dictated by:   Pershing Cox, M.D. Attending Physician:  Marin Comment DD:  08/09/01 TD:  08/10/01 Job: [id number]

## 2010-07-04 NOTE — Op Note (Signed)
[facility]  Patient:    Hailey Shields, Hailey Shields Visit Number: [id number] MRN: [id number]          Service Type: GYN Location: [phone] Attending Physician:  Marin Comment Dictated by:   Mardene Celeste. Lurene Shadow, M.D. Proc. Date: 08/04/01 Admit Date:  08/04/2001   CC:         Two copies to Dr. Charlette Caffey, M.D.   Operative Report  PREOPERATIVE DIAGNOSIS:  Chronic calculus cholecystitis.  POSTOPERATIVE DIAGNOSIS:  Chronic calculus cholecystitis.  OPERATION:  Open cholecystectomy.  SURGEON:  Mardene Celeste. Lurene Shadow, M.D.  ASSISTANT:  Pershing Cox, M.D.  ANESTHESIA:  General  INDICATION FOR PROCEDURE:  This patient is a 65 year old woman who presents originally with abdominal pain with some symptoms of reflux.  On workup she is noted to have a very large pelvic adnexal mass and cholelithiasis.  She is brought to the operating room for radical total abdominal hysterectomy and bilateral salpingo-oophorectomy with node dissection by Dr. Carey Bullocks and cholecystectomy by me.  The total abdominal hysterectomy and bilateral salpingo-oophorectomy note will be dictated separately by Dr. Carey Bullocks.  DESCRIPTION OF PROCEDURE:  At the time I entered the operating room, the abdomen was already opened and the hysterectomy and bilateral salpingo-oophorectomy had been performed.  I entered the operative field and extensive the midline incision upward into the epigastrium to get adequate access to the upper abdomen.  The liver was retracted downward so as to get good access to the gallbladder.  The gallbladder was grasped and dissection carried down.  The hepatoduodenal ligament with isolation and identified of the cystic artery and cystic duct.  The cystic artery was doubly clipped and transected.  The cystic duct was of normal caliber and traced down to the cystic duct common duct junction.  The common duct was of normal size.  The cystic duct was then  doubly clipped and transected.  The gallbladder was then dissected free from the liver bed maintaining hemostasis throughout the course of the dissection with electrocautery.  At the end of the dissection, hemostasis within the liver bed was secured.  The gallbladder was removed and forwarded for pathologic evaluation.  The rest of the omental areas of dissection were checked for hemostasis. Hemostasis was noted to be excellent. Sponge, instrument and sharp counts were fully verified.  The abdominal wound was closed with a running doubled Prolene suture in the midline fascia. Subcutaneous tissues were then thoroughly irrigated and the skin was closed with staples.  Sterile dressing applied.  Anesthetic reversed and the patient moved from the operating room to the recovery room in stable condition.  She tolerated the procedure well. Dictated by:   Mardene Celeste. Lurene Shadow, M.D. Attending Physician:  Marin Comment DD:  08/04/01 TD:  08/05/01 Job: [id number]

## 2010-07-04 NOTE — Consult Note (Signed)
NAME:  Hailey Shields, Hailey Shields                             ACCOUNT NO.:  [id number]   MEDICAL RECORD NO.:  [id number]                   PATIENT TYPE:  EMS   LOCATION:  ED                                   FACILITY:  [facility]   PHYSICIAN:  Melvyn Novas, M.D.               DATE OF BIRTH:  1945/03/05   DATE OF CONSULTATION:  07/16/2003  DATE OF DISCHARGE:  07/16/2003                                   CONSULTATION   EMERGENCY ROOM CONSULTATION   HISTORY OF PRESENT ILLNESS:  Mrs. Heckert is admitted through the [facility] ER to the stroke service at [facility] on Monday, Jul 16, 2003 at 2200 in the evening.  The patient presented here with transient left-  sided weakness and clumsiness which occurred at approximately 9 a.m. this  morning at her work place, was brought to her primary care physician's  office, and then finally redirected to the [employer].  The patient was  familiar with [facility] because she had suffered 3 years ago from ovarian  cancer and was treated in this facility.  Upon arrival here the ER  physician, Dr. Susy Manor, evaluated the patient and could find no residual  symptoms of left-sided weakness, clumsiness, dysmetria, ataxia, or any focal  neurologic deficits at all.  I admitted the patient to the neurology service  under the presumed diagnosis of TIA and since our stroke service is  localized at [facility] the patient was transferred that night to  [facility]   The patient had an MRI brain and MRI neck ordered but we could not perform  these through the ER so that they had to wait for 12 hours to have the test  done at [facility].  Again, diagnosis here was transient left-sided weakness  and numbness, history of ovarian cancer.  A CT unenhanced at night in the ER  was negative.  The MRA showed normal right carotid artery system.  Left  carotid was abnormal with a small, shallow ulceration but nonstenotic  plaque.  No dissection or  evidence of fibromuscular dysplasia were seen.  The brain scan showed mild atrophy and small vessel disease by MRI enhanced  and unenhanced.  Small deep white matter lacunar infarct was noticed in the  right parietal region.  There was no abnormal enhancement after the  administration of gadolinium.  Diffusion-weighted images did show an acute  infarct in the right parietal centrum semiovale less than a centimeter in  size and therefore categorized as a lacune.   Laboratory results show a normal hemoglobin A1c, normal CBC with  differential, a normal Chem 7.  No evidence of diabetes or thyroid disease.  Sed rate unremarkable.  Urinary tests were also obtained.  Tox screen was  negative.  CK, CK-MB negative for cardiac even.  Potassium was slightly on  the low side  at 3.3.   Echocardiogram was obtained on Jul 17, 2003 and the patient showed normal  left ventricular size, overall normal EF at 55-65%, and no abnormality of  left ventricular function.  The left atrium, however, was described as  mildly dilated.  Mitral valve was intact.  No evidence of aortic stenosis.  No evidence of embolic source.   PHYSICAL EVALUATION:  VITAL SIGNS:  The patient had a temperature of 97.8.  Pulse rate was from high 50's to low 70's, variable, with orthostatic  testing.  Respiration rates remained around 16.  Systolic blood pressure 120  on average, highest at 157 the day of admission,  2 a.m.  Diastolic blood  pressure also normal at the 70 range.  Oxygen saturations were always  satisfactory, above 95% on room air.  The patient was admitted with the  order to keep her on 2 L of nasal oxygen which she tolerated well but did  not require from her pulmonary function.  GENERAL:  Lungs clear to auscultation.  The patient is mildly obese.  No  peripheral edema, clubbing, or cyanosis.  No evidence of bruises or trauma,  atraumatic mucous membranes as well.  Lymph nodes not enlarged, no goiter.  NEUROLOGIC:   Mental status:  Alert and oriented x3, fluent speech, no  comprehension deficits.  Cranial nerves:  Pupils react equal to light and  accommodation, full extraocular movements, and full visual fields  bilaterally.  No papiledema.  Facial symmetric preserved.  Facial sensory  preserved.  Tongue and uvula midline.  Range of motion for the neck is  intact.   Motor exam 5/5 bilateral equal strength, tone, and mass.   Deep tendon reflexes 1+, downgoing toes to plantar stimulation, no clonus.   Sensory intact to primary and secondary modalities upon testing by Q-tip,  tuning fork, and ice cube.  Finger-nose test showed no tremor, ataxia, or  dysmetria.   CONCLUSION:  No focal neurologic deficits persistent but according to the  paroxysmal and transient onset of left-sided hemi numbness we will admit the  patient to rule out transient ischemic attack or small pure sensory stroke  as described above in the MRI report that was obtained after her admission.  The patient will follow up with Dr. Delia Heady, stroke M.D. for the  neurology team.                                               Melvyn Novas, M.D.    CD/MEDQ  D:  07/18/2003  T:  07/19/2003  Job:  [id number]

## 2010-07-04 NOTE — Discharge Summary (Signed)
Hailey Shields, Hailey Shields                   ACCOUNT NO.:  [id number]   MEDICAL RECORD NO.:  [id number]          PATIENT TYPE:  INP   LOCATION:  1618                         FACILITY:  [facility]   PHYSICIAN:  Sharlet Salina T. Hoxworth, M.D.DATE OF BIRTH:  Mar 06, 1945   DATE OF ADMISSION:  03/02/2006  DATE OF DISCHARGE:  03/05/2006                               DISCHARGE SUMMARY   DISCHARGE DIAGNOSES:  1. Ventral incisional hernia.  2. Morbid obesity.   SURGICAL PROCEDURES:  Laparoscopic repair of ventral hernia on  03/02/2006.   HISTORY OF PRESENT ILLNESS:  Hailey Shields is a 65 year old female with a  history of midline laparotomy for ovarian cancer performed by Dr.  Gildardo Griffes in 2003.  She has remained cancer-free on followup, but  has had intermittent discomfort and pain along her midline incision  periumbilically and in the upper abdomen.  She has also noted some  swelling and protuberance of the abdomen.  She has had a recent CT scan  showing several hernias along her longer midline.  No nausea or  vomiting.  Bowel movements are normal.   PAST MEDICAL HISTORY:  A TAH-BSO for cancer of the ovary in 2003 as  above.  Previously she had had an umbilical hernia repair with mesh in  2001 by Dr. Mosetta Anis.  Medically she is followed for hypertension,  dyslipidemia, and has a history of TIA in 2004.   MEDICATIONS ON ADMISSION:  1. Lipitor 40 mg daily.  2. Aspirin 81 mg daily.  3. Lisinopril/hydrochlorothiazide 10/12.5 mg daily.  4. Calcium supplement.   ALLERGIES:  CORTISONE and Z-PAK.   SOCIAL HISTORY, FAMILY HISTORY, REVIEW OF SYSTEMS:  Noncontributory see  detailed H&P.   PERTINENT PHYSICAL EXAM:  She is 5 feet 2 inches to 134 pounds, blood  pressure 137/83, pulse 83.  Pertinent findings were limited to abdomen  which revealed a long midline incision with some mild tenderness along  the incision, and a diffuse hernia along the length of her midline  incision.  A CT scan has revealed  3 apparent separate areas of discrete  herniation in the epigastrium, around the umbilicus and in the low  midline containing bowel.   HOSPITAL COURSE:  The patient was admitted electively by 03/02/2006 and  underwent repair of a large incisional hernia, laparoscopically, using  PROCEED mesh.  She tolerated procedure well although she did have quite  a bit of pain over the first 24 hours.  Toradol was added for pain  control with much improvement.  By January 17 she was very sore; had  some difficulty mobilizing; but was having much less pain.  A white  count was 6.4 thousand; hemoglobin was 12.1.  Continued efforts were  made at mobilization; and by January 18 she was ambulatory, and pain  adequately controlled with oral meds.  Incisions are healing without  infection.  She is afebrile.   DISCHARGE MEDICATIONS:  The same as admission plus Tylox for pain.   FOLLOWUP:  To be my office in 2 weeks.      Lorne Skeens. Hoxworth, M.D.  Electronically  Signed     BTH/MEDQ  D:  03/29/2006  T:  03/30/2006  Job:  [id number]   cc:   Brett Canales A. Cleta Alberts, M.D.  Fax: [phone]

## 2010-07-04 NOTE — Discharge Summary (Signed)
NAME:  Hailey Shields, Hailey Shields                             ACCOUNT NO.:  [id number]   MEDICAL RECORD NO.:  [id number]                   PATIENT TYPE:  INP   LOCATION:  4714                                 FACILITY:  [facility]   PHYSICIAN:  Richard A. Alanda Amass, M.D.          DATE OF BIRTH:  02-Jun-1945   DATE OF ADMISSION:  11/04/2001  DATE OF DISCHARGE:  11/05/2001                                 DISCHARGE SUMMARY   DISCHARGE DIAGNOSES:  1. Chest pain, new onset angina without myocardial infarction.  Status post     cardiac catheterization on 11/04/01 with borderline left anterior     descending occlusion and minor proximal right coronary artery disease.  2. Normal left ventricular function.  3. 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  Subsequently monthly chemotherapy x3     under the direction of Dr. Pierce Crane.  4. _________.  5. Hyperlipidemia.  6. Mild hypertension.   The patient is a 65 year old Caucasian lady without any prior history of  coronary artery disease who developed chest pain on the morning of her  presentation to the emergency room with shortness of breath and some  diaphoresis, but no nausea or vomiting.  Described the chest pain went off  probably in 10 minutes and the patient was able to fall asleep again and  then woke up in the morning with another episode of shortness of breath and  chest pressure and tightness and she was delivered to the emergency room.   She was admitted on a rule out myocardial infarction protocol and also for  cardiac catheterization for definitive diagnosis.   HOSPITAL PROCEDURES:  Cardiac catheterization performed by Dr. Alanda Amass on  11/04/01.  Catheterization showed borderline coronary artery disease.  LAD  had an eccentric 50 to 60% lesion beyond the first diagonal and just before  the first septal perforator.  There was segmental disease of the LAD with  about 40% narrowing  proximal to the first diagonal.  The plaque was  eccentric and due to the ostia of the diagonal one, smooth with good flow.  The remainder of the LAD had course near the apex of the heart and had no  significant disease.  Right coronary artery was dominant, where vessel was  large and there was atherosclerotic irregularity 30 to 48%, segmental  narrowing throughout the proximal third after the atrial and before the RV  branch.  There was good flow and there was minimal irregularity in the mid  RCA.  The PDA and ________ were normal.  Left ventricular function was  normal.   Recommendations postcatheterization were to continue with the medical  therapy.   The patient tolerated procedure well and was transferred to the unit in  stable condition.   The next morning she was assessed by Dr. ____________ and deemed stable for  discharge home.  HOSPITAL LABS:  CBC showed white blood cell count 8.8, hemoglobin 11.5,  hematocrit 34 and platelet count 228 on the day of discharge.  Her sodium  was 140, potassium 3.9, chloride 110, CO2 26, glucose 90, BUN 11, creatinine  0.7, calcium 8.8.  Liver function test was within normal limits.  Lipid  profile revealed total cholesterol 198, triglycerides 164, HDL 35, LDL 130.  Cardiac enzymes were negative X3. TSH 1.473.   EKG did not reveal any ST or T wave changes. Normal sinus rhythm.   Portable x-ray of the chest showed a large cardiac silhouette without any  evidence for acute cardiopulmonary process.   DISCHARGE MEDICATIONS:  1. Altace 5 mg q.d.  2. Aspirin 81 mg q.d.  3. Zocor 20 mg q.d.  4. Lopressor 25 mg b.i.d.  5. Plavix 75 mg q.d. if okay with Dr. Donnie Coffin.   ACTIVITY:  No driving, no exertional activity for three days  postcatheterization.   DIET:  Low fat, low cholesterol diet.   WOUND CARE:  The patient  was allowed to shower.  Instructed not to wrap  groin puncture site but wash it with mild soap and pat it dry. Also, she was   instructed to report any problems with the groin puncture site to our office  and phone number was provided.   FOLLOW UP:  Followup appointment for Cardiolite and appointment with Dr.  Alanda Amass posttest would be scheduled after discharge.  Office will contact,  the discharge being completed on the weekend.  Office will contact patient  next week to schedule her for the study and followup appointment with Dr.  Alanda Amass.       Raymon Mutton, P.A.                    Richard A. Alanda Amass, M.D.    MK/MEDQ  D:  12/26/2001  T:  12/27/2001  Job:  [id number]

## 2010-07-04 NOTE — Consult Note (Signed)
[facility]  Patient:    Hailey Shields, Hailey Shields Visit Number: [id number] MRN: [id number]          Service Type: GYN Location: [id number] Attending Physician:  Marin Comment Dictated by:   Rosemarie Ax, N.P. Proc. Date: 08/08/01 Admit Date:  08/04/2001 Discharge Date: 08/09/2001   CC:         Pershing Cox, M.D.  Mardene Celeste Lurene Shadow, M.D.  [facility]  Genia Del, M.D.   Consultation Report  DATE OF BIRTH:  1945-06-23  REASON FOR CONSULT:  Ovarian cancer.  REQUESTING PHYSICIAN:  Pershing Cox, M.D.  HISTORY OF PRESENT ILLNESS:  This is a 65 year old female with abdominal pressure and some dyspnea over the past two years.  She had not experienced a menstrual period for three years and noted some vaginal bleeding.  At that time she went to see her primary physician, Dr. Andee Poles, who arranged for an ultrasound on June 14 and a CT of the abdomen.  Both showed a large pelvic mass compressing the uterus and bladder.  Now, status post cholecystectomy by Dr. Leonie Man for symptomatic gallstones and a total abdominal hysterectomy as well as a bilateral salpingo-oophorectomy, omentumectomy, and pelvic lymphadenectomy with preliminary pathology report consistent with clear cell carcinoma of the ovary.  Peritoneal washings and nodes were stated as negative per the verbal report.  Pathology report is still pending.  The presurgical CA-125 was 86.  A 20 cm mass was resected.  PAST MEDICAL HISTORY:  Essentially negative.  PAST SURGICAL HISTORY:  T&A age 69, TAH and BSO on August 03, 2001, umbilical hernia repair in 2001.  ALLERGIES:  AZITHROMYCIN as well as STEROIDS, presumably PREDNISONE, both of which increase the heart rate.  CURRENT MEDICATIONS:  None.  FAMILY HISTORY:  Mother is alive and well at 52 with osteoarthritis.  Father is alive and well at 39.  [name] has one brother who is also alive and  well.  SOCIAL HISTORY:  She has been married to her husband, Dorene Sorrow, for 34 years. They have one son and two daughters.  They currently live in [place] and all of the children live in surrounding cities.  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  She does not use alcohol and has never smoked.  REVIEW OF SYSTEMS:  She has not had any cough, chest or pleuritic pain, but has experienced some dyspnea as well as abdominal pressure.  She has also had frequency of urination with increased full and increased fatigue.  She has not had any weight gain and has not really experienced any weakness.  She has noted no increase or decrease in appetite and has noted no palpitations.  [name] has experienced some constipation with increased sleep disturbance stating that this is because of just the pressure on the abdomen.  She has also had increased nausea.  PHYSICAL EXAMINATION  GENERAL:  This is a 65 year old smiling female, alert and oriented.  VITAL SIGNS:  Temperature 97.6, pulse 93, respirations 18, blood pressure 144/76.  HEENT:  Normocephalic, atraumatic.  Gross hearing intact.  PERRLA.  EOMs intact.  Oropharynx is clear without plaque or lesions.  NODES:  There are no cervical, axillary, or inguinal adenopathy.  CHEST:  Clear to auscultation.  CARDIOVASCULAR:  Regular rate and rhythm.  A 2/6 systolic murmur.  No gallop.  ABDOMEN:  Obese, distended.  Large vertical surgical abdominal wound currently healing and staples in  place.  EXTREMITIES:  No clubbing, cyanosis, edema.  There is no calf tenderness.  NEUROLOGIC:  Alert and oriented x3.  Cranial nerves II-XII intact.  Strength 5/5.  DTRs 2+.  LABORATORIES:  WBC 8.4, hemoglobin 11.0, hematocrit 32.2, platelets 268,000. Sodium 139, potassium 3.7, chloride 108, CO2 27, glucose 154, BUN 7, creatinine 0.7, calcium 7.9.  ASSESSMENT AND PLAN:  This is a 65 year old female status post total  abdominal hysterectomy, bilateral salpingo-oophorectomy, and cholecystectomy.  Pathology report is pending, but consistent with clear cell carcinoma.  The patient was seen and examined by Dr. Pierce Crane and plans to follow up with the patient in his office in two weeks.  He notes that the patient presented with a large pelvic mass and found to be consistent with clear cell carcinoma.  She has been in good health otherwise.  Will plan to see in two weeks to discuss treatment options.  She was encouraged to bring family with her to the office. Dictated by:   Rosemarie Ax, N.P. Attending Physician:  Marin Comment DD:  08/08/01 TD:  08/09/01 Job: [id number]

## 2010-10-29 ENCOUNTER — Other Ambulatory Visit: Payer: Self-pay | Admitting: Emergency Medicine

## 2010-10-29 DIAGNOSIS — Z1231 Encounter for screening mammogram for malignant neoplasm of breast: Secondary | ICD-10-CM

## 2010-11-11 LAB — CBC
MCV: 92.2
Platelets: 297
RBC: 4.52
WBC: 5.8

## 2010-11-11 LAB — ABO/RH: ABO/RH(D): A POS

## 2010-11-11 LAB — TYPE AND SCREEN
ABO/RH(D): A POS
Antibody Screen: NEGATIVE

## 2010-11-11 LAB — BASIC METABOLIC PANEL
BUN: 14
Chloride: 104
Creatinine, Ser: 0.66
GFR calc Af Amer: >60
GFR calc non Af Amer: >60
Potassium: 4

## 2010-11-25 ENCOUNTER — Ambulatory Visit: Payer: Self-pay

## 2011-01-05 ENCOUNTER — Ambulatory Visit: Payer: Self-pay

## 2011-07-15 ENCOUNTER — Encounter: Payer: Self-pay | Admitting: Emergency Medicine

## 2011-07-15 ENCOUNTER — Ambulatory Visit (INDEPENDENT_AMBULATORY_CARE_PROVIDER_SITE_OTHER): Payer: Medicare Other | Admitting: Emergency Medicine

## 2011-07-15 ENCOUNTER — Ambulatory Visit: Payer: Medicare Other

## 2011-07-15 VITALS — BP 137/79 | HR 74 | Temp 98.2°F | Resp 16 | Ht 62.0 in | Wt 231.0 lb

## 2011-07-15 DIAGNOSIS — S63509A Unspecified sprain of unspecified wrist, initial encounter: Secondary | ICD-10-CM

## 2011-07-15 DIAGNOSIS — M25539 Pain in unspecified wrist: Secondary | ICD-10-CM

## 2011-07-15 DIAGNOSIS — M79609 Pain in unspecified limb: Secondary | ICD-10-CM

## 2011-07-15 MED ORDER — MELOXICAM 7.5 MG PO TABS: ORAL_TABLET | ORAL | Status: DC

## 2011-07-15 NOTE — Progress Notes (Signed)
  Subjective:    Patient ID: Hailey Shields, female    DOB: 04/19/1945, 66 y.o.   MRN: [id number]  HPI patient was in her usual state of health until approximately 10 days ago when while lifting a carton of Pepsi she developed severe pain in her left wrist. She did not feel she had a significant injury however over the last 2-3 days she has had progressive increase in pain and discomfort in her wrist    Review of Systems     Objective:   Physical Exam is marked tenderness over the wrist joint. There is pain with supination pronation of the wrist.    [facility] reading (PRIMARY) by  Dr.Laura-Lee Villegas there severe degenerative changes at the base of the first metacarpal. There are no fractures seen.     Assessment & Plan:  I suspect she had a ligamentous injury when she tried to do the lifting. There is significant degenerative changes seen on her x-ray which could represent gouty tophi. She has no history of gout but certainly this could be a possibility

## 2011-11-03 ENCOUNTER — Encounter: Payer: Self-pay | Admitting: Emergency Medicine

## 2012-01-24 ENCOUNTER — Other Ambulatory Visit: Payer: Self-pay | Admitting: Emergency Medicine

## 2012-01-24 NOTE — Telephone Encounter (Signed)
Please pull paper chart.  

## 2012-01-25 NOTE — Telephone Encounter (Signed)
Chart pulled to [place] pool [phone]

## 2014-11-26 ENCOUNTER — Encounter (HOSPITAL_COMMUNITY): Payer: Self-pay | Admitting: Family Medicine

## 2014-11-26 ENCOUNTER — Inpatient Hospital Stay (HOSPITAL_COMMUNITY)
Admission: EM | Admit: 2014-11-26 | Discharge: 2014-12-01 | DRG: 354 | Disposition: A | Discharge status: Home / Self Care | Payer: Medicare Other | Attending: Surgery | Admitting: Surgery

## 2014-11-26 DIAGNOSIS — K430 Incisional hernia with obstruction, without gangrene: Secondary | ICD-10-CM | POA: Diagnosis present

## 2014-11-26 DIAGNOSIS — K56609 Unspecified intestinal obstruction, unspecified as to partial versus complete obstruction: Secondary | ICD-10-CM | POA: Diagnosis present

## 2014-11-26 DIAGNOSIS — I10 Essential (primary) hypertension: Secondary | ICD-10-CM | POA: Diagnosis present

## 2014-11-26 DIAGNOSIS — Z8543 Personal history of malignant neoplasm of ovary: Secondary | ICD-10-CM

## 2014-11-26 DIAGNOSIS — E669 Obesity, unspecified: Secondary | ICD-10-CM | POA: Diagnosis present

## 2014-11-26 DIAGNOSIS — R112 Nausea with vomiting, unspecified: Secondary | ICD-10-CM | POA: Diagnosis present

## 2014-11-26 DIAGNOSIS — Z6841 Body Mass Index (BMI) 40.0 and over, adult: Secondary | ICD-10-CM | POA: Diagnosis not present

## 2014-11-26 DIAGNOSIS — K460 Unspecified abdominal hernia with obstruction, without gangrene: Secondary | ICD-10-CM

## 2014-11-26 HISTORY — DX: Essential (primary) hypertension: I10

## 2014-11-26 LAB — BASIC METABOLIC PANEL
Anion gap: 11 (ref 5–15)
BUN: 21 mg/dL — AB (ref 6–20)
CHLORIDE: 102 mmol/L (ref 101–111)
CO2: 25 mmol/L (ref 22–32)
Calcium: 9.5 mg/dL (ref 8.9–10.3)
Creatinine, Ser: 0.68 mg/dL (ref 0.44–1.00)
Glucose, Bld: 190 mg/dL — ABNORMAL HIGH (ref 65–99)
POTASSIUM: 3.8 mmol/L (ref 3.5–5.1)
SODIUM: 138 mmol/L (ref 135–145)

## 2014-11-26 LAB — CBC
HEMATOCRIT: 43.6 % (ref 36.0–46.0)
Hemoglobin: 15 g/dL (ref 12.0–15.0)
MCH: 31.3 pg (ref 26.0–34.0)
MCHC: 34.4 g/dL (ref 30.0–36.0)
MCV: 90.8 fL (ref 78.0–100.0)
PLATELETS: 263 10*3/uL (ref 150–400)
RBC: 4.8 MIL/uL (ref 3.87–5.11)
RDW: 12.8 % (ref 11.5–15.5)
WBC: 16.9 10*3/uL — AB (ref 4.0–10.5)

## 2014-11-26 MED ORDER — CETYLPYRIDINIUM CHLORIDE 0.05 % MT LIQD: 7.0000 mL | Freq: Two times a day (BID) | OROMUCOSAL | Status: DC

## 2014-11-26 MED ORDER — KCL IN DEXTROSE-NACL 20-5-0.45 MEQ/L-%-% IV SOLN
INTRAVENOUS | Status: DC
  Administered 2014-11-27: 06:00:00 via INTRAVENOUS
  Filled 2014-11-26 (×5): qty 1000

## 2014-11-26 MED ORDER — DIPHENHYDRAMINE HCL 50 MG/ML IJ SOLN: 12.5000 mg | Freq: Four times a day (QID) | INTRAMUSCULAR | Status: DC | PRN

## 2014-11-26 MED ORDER — DIPHENHYDRAMINE HCL 12.5 MG/5ML PO ELIX: 12.5000 mg | ORAL_SOLUTION | Freq: Four times a day (QID) | ORAL | Status: DC | PRN

## 2014-11-26 MED ORDER — METHOCARBAMOL 1000 MG/10ML IJ SOLN
500.0000 mg | Freq: Three times a day (TID) | INTRAVENOUS | Status: DC | PRN
  Filled 2014-11-26: qty 5

## 2014-11-26 MED ORDER — CEFAZOLIN SODIUM-DEXTROSE 2-3 GM-% IV SOLR
2.0000 g | INTRAVENOUS | Status: AC
  Administered 2014-11-27: 2 g via INTRAVENOUS
  Filled 2014-11-26: qty 50

## 2014-11-26 MED ORDER — ENOXAPARIN SODIUM 40 MG/0.4ML ~~LOC~~ SOLN
40.0000 mg | SUBCUTANEOUS | Status: DC
  Filled 2014-11-26: qty 0.4

## 2014-11-26 MED ORDER — PANTOPRAZOLE SODIUM 40 MG IV SOLR
40.0000 mg | Freq: Every day | INTRAVENOUS | Status: DC
  Administered 2014-11-26 – 2014-11-28 (×3): 40 mg via INTRAVENOUS
  Filled 2014-11-26 (×5): qty 40

## 2014-11-26 MED ORDER — ONDANSETRON HCL 4 MG/2ML IJ SOLN
4.0000 mg | Freq: Four times a day (QID) | INTRAMUSCULAR | Status: DC | PRN
  Administered 2014-11-26 (×2): 4 mg via INTRAVENOUS
  Filled 2014-11-26 (×2): qty 2

## 2014-11-26 MED ORDER — ONDANSETRON 4 MG PO TBDP
4.0000 mg | ORAL_TABLET | Freq: Four times a day (QID) | ORAL | Status: DC | PRN
Start: 2014-11-26 — End: 2014-12-01

## 2014-11-26 MED ORDER — MORPHINE SULFATE (PF) 2 MG/ML IV SOLN
1.0000 mg | INTRAVENOUS | Status: DC | PRN
  Administered 2014-11-26 (×2): 2 mg via INTRAVENOUS
  Filled 2014-11-26 (×2): qty 1

## 2014-11-26 MED ORDER — ACETAMINOPHEN 650 MG RE SUPP: 650.0000 mg | Freq: Four times a day (QID) | RECTAL | Status: DC | PRN

## 2014-11-26 MED ORDER — HYDRALAZINE HCL 20 MG/ML IJ SOLN: 5.0000 mg | Freq: Four times a day (QID) | INTRAMUSCULAR | Status: DC | PRN

## 2014-11-26 MED ORDER — CHLORHEXIDINE GLUCONATE 0.12 % MT SOLN
15.0000 mL | Freq: Two times a day (BID) | OROMUCOSAL | Status: DC
  Administered 2014-11-26 – 2014-11-30 (×5): 15 mL via OROMUCOSAL
  Filled 2014-11-26 (×11): qty 15

## 2014-11-26 MED ORDER — ACETAMINOPHEN 325 MG PO TABS: 650.0000 mg | ORAL_TABLET | Freq: Four times a day (QID) | ORAL | Status: DC | PRN

## 2014-11-26 NOTE — ED Notes (Signed)
Pt is a transfer from [facility] Emergency Department. She has a dx of incarcerated hernia with small bowel obstruction. Pt was given DILAUDID  at 2am, ZOFRAN , and Normal Saline bolus of . Currently has D5 1/2 NS with 20 K at .

## 2014-11-26 NOTE — H&P (Signed)
Hailey Shields 1945/06/01  [id number].   Chief Complaint/Reason for Consult: SBO  HPI: This is a 69 yo white female who has a h/o ovarian cancer that she underwent a hysterectomy for in 2003.  She then developed an incisional hernia that was fixed several years ago by Dr. Johna Sheriff with mesh.  She has done well since then until yesterday.  She started having some lower abdominal discomfort with a full bloated feeling.  She tried to eat some lunch, but this got worse.  She then began to have significant amounts of nausea and vomiting.  This continued to persist.  Her last BM and flatus was yesterday around lunch time.  Due to persistent nausea, vomiting, and abdominal pain, she went to [facility] where she had a CT Scan that revealed a SBO secondary to abdominal wall hernia.  She requested transfer to [facility] long hospital.    ROS : Please see HPI, otherwise all other systems are negative  History reviewed. No pertinent family history.  Past Medical History  Diagnosis Date  . Hypertension   . Ovarian cancer [facility])     Past Surgical History  Procedure Laterality Date  . Abdominal hysterectomy    . Back surgery    . Ventral hernia repair      with mesh  . Umbilical hernia repair      Social History:  reports that she has never smoked. She does not have any smokeless tobacco history on file. She reports that she drinks alcohol. She reports that she does not use illicit drugs.  Allergies:  Allergies  Allergen Reactions  . Prednisone     Increase heart rate  . Zithromax [Azithromycin]     Increase heart rate     (Not in a hospital admission)  Blood pressure 119/61, pulse 74, temperature 98 F (36.7 C), temperature source Oral, resp. rate 18, height  (1.6 m), weight 99.791 kg (220 lb), SpO2 96 %. Physical Exam: General: pleasant, obese white female who is laying in bed in NAD, but lethargic secondary to pain meds HEENT: head is normocephalic, atraumatic.  Sclera are  noninjected.  PERRL.  Ears and nose without any masses or lesions.  Mouth is pink and moist Heart: regular, rate, and rhythm.  Normal s1,s2. No obvious gallops, or rubs noted, but she has a murmur.  Palpable radial and pedal pulses bilaterally Lungs: CTAB, no wheezes, rhonchi, or rales noted.  Respiratory effort nonlabored, but decrease at the bases Abd: soft, tender mostly throughout the lower abdominal wall, some distention, hypoactive BS, no masses or organomegaly.  She does have several hernias noted, but it is difficult to determine whether they are being reduced due to body habitus.  NGT is put on suction with some feculent drainage MS: all 4 extremities are symmetrical with no cyanosis, clubbing, or edema. Skin: warm and dry with no masses, lesions, or rashes Psych: A&Ox3 at times, but also lethargic secondary to pain medication.    No results found for this or any previous visit (from the past 48 hour(s)). No results found.     Assessment/Plan 1. SBO secondary to incarcerated incisional hernia -admit  -IVFs, prn meds for pain and nausea -check labs, EKG, pre-operatively -agree with NGT placement as drainage is feculent in nature -patient will likely need to go to the OR today for surgical repair of these hernias and to relieve her bowel obstruction.  She does not appear toxic at this time with a concern for ischemic bowel. -will  d/w Dr. Gerrit Friends regarding timing of the procedure 2. HTN -hold home med. -prn hydralazine 3. DVT propohylaxis -SCDs/ hold lovenox today for possible OR, but start tomorrow post op  Mitchelle Goerner E 11/26/2014, 8:25 AM Pager: [phone]

## 2014-11-26 NOTE — ED Notes (Signed)
Dr. Ezzard Standing has been paged by Art, Secretary.

## 2014-11-27 ENCOUNTER — Inpatient Hospital Stay (HOSPITAL_COMMUNITY): Payer: Medicare Other | Admitting: Anesthesiology

## 2014-11-27 ENCOUNTER — Encounter (HOSPITAL_COMMUNITY): Admission: EM | Disposition: A | Discharge status: Home / Self Care | Payer: Medicare Other | Source: Home / Self Care

## 2014-11-27 ENCOUNTER — Encounter (HOSPITAL_COMMUNITY): Payer: Self-pay

## 2014-11-27 HISTORY — PX: LAPAROTOMY: SHX154

## 2014-11-27 HISTORY — PX: INSERTION OF MESH: SHX5868

## 2014-11-27 HISTORY — PX: VENTRAL HERNIA REPAIR: SHX424

## 2014-11-27 LAB — CBC
HEMATOCRIT: 39 % (ref 36.0–46.0)
HEMOGLOBIN: 12.9 g/dL (ref 12.0–15.0)
MCH: 30.6 pg (ref 26.0–34.0)
MCHC: 33.1 g/dL (ref 30.0–36.0)
MCV: 92.4 fL (ref 78.0–100.0)
Platelets: 230 10*3/uL (ref 150–400)
RBC: 4.22 MIL/uL (ref 3.87–5.11)
RDW: 13.1 % (ref 11.5–15.5)
WBC: 8.7 10*3/uL (ref 4.0–10.5)

## 2014-11-27 LAB — BASIC METABOLIC PANEL
ANION GAP: 6 (ref 5–15)
BUN: 24 mg/dL — ABNORMAL HIGH (ref 6–20)
CALCIUM: 8.8 mg/dL — AB (ref 8.9–10.3)
CHLORIDE: 105 mmol/L (ref 101–111)
CO2: 29 mmol/L (ref 22–32)
Creatinine, Ser: 0.61 mg/dL (ref 0.44–1.00)
GFR calc non Af Amer: >60 mL/min (ref >60)
Glucose, Bld: 135 mg/dL — ABNORMAL HIGH (ref 65–99)
POTASSIUM: 3.7 mmol/L (ref 3.5–5.1)
Sodium: 140 mmol/L (ref 135–145)

## 2014-11-27 LAB — SURGICAL PCR SCREEN
MRSA, PCR: NEGATIVE
Staphylococcus aureus: NEGATIVE

## 2014-11-27 SURGERY — LAPAROTOMY, EXPLORATORY
Anesthesia: General | Site: Abdomen

## 2014-11-27 MED ORDER — SUGAMMADEX SODIUM 500 MG/5ML IV SOLN
INTRAVENOUS | Status: AC
  Filled 2014-11-27: qty 5

## 2014-11-27 MED ORDER — SODIUM CHLORIDE 0.9 % IJ SOLN: 9.0000 mL | INTRAMUSCULAR | Status: DC | PRN

## 2014-11-27 MED ORDER — ONDANSETRON HCL 4 MG/2ML IJ SOLN
INTRAMUSCULAR | Status: AC
  Filled 2014-11-27: qty 2

## 2014-11-27 MED ORDER — DEXAMETHASONE SODIUM PHOSPHATE 10 MG/ML IJ SOLN
INTRAMUSCULAR | Status: AC
  Filled 2014-11-27: qty 1

## 2014-11-27 MED ORDER — ONDANSETRON HCL 4 MG/2ML IJ SOLN: 4.0000 mg | Freq: Four times a day (QID) | INTRAMUSCULAR | Status: DC | PRN

## 2014-11-27 MED ORDER — MORPHINE SULFATE 1 MG/ML IV SOLN
INTRAVENOUS | Status: DC
  Administered 2014-11-27: 15:00:00 via INTRAVENOUS
  Administered 2014-11-27: 3 mg via INTRAVENOUS
  Administered 2014-11-28 – 2014-11-29 (×3): 1.5 mg via INTRAVENOUS

## 2014-11-27 MED ORDER — SODIUM CHLORIDE 0.9 % IR SOLN
Status: DC | PRN
  Administered 2014-11-27: 2000 mL

## 2014-11-27 MED ORDER — DIPHENHYDRAMINE HCL 12.5 MG/5ML PO ELIX: 12.5000 mg | ORAL_SOLUTION | Freq: Four times a day (QID) | ORAL | Status: DC | PRN

## 2014-11-27 MED ORDER — NALOXONE HCL 0.4 MG/ML IJ SOLN: 0.4000 mg | INTRAMUSCULAR | Status: DC | PRN

## 2014-11-27 MED ORDER — KCL IN DEXTROSE-NACL 20-5-0.45 MEQ/L-%-% IV SOLN
INTRAVENOUS | Status: DC
  Administered 2014-11-27 – 2014-11-28 (×3): via INTRAVENOUS
  Filled 2014-11-27 (×8): qty 1000

## 2014-11-27 MED ORDER — ROCURONIUM BROMIDE 100 MG/10ML IV SOLN
INTRAVENOUS | Status: DC | PRN
  Administered 2014-11-27: 10 mg via INTRAVENOUS
  Administered 2014-11-27: 30 mg via INTRAVENOUS
  Administered 2014-11-27: 10 mg via INTRAVENOUS
  Administered 2014-11-27: 20 mg via INTRAVENOUS

## 2014-11-27 MED ORDER — PHENYLEPHRINE 40 MCG/ML (10ML) SYRINGE FOR IV PUSH (FOR BLOOD PRESSURE SUPPORT)
PREFILLED_SYRINGE | INTRAVENOUS | Status: AC
  Filled 2014-11-27: qty 30

## 2014-11-27 MED ORDER — SUFENTANIL CITRATE 50 MCG/ML IV SOLN
INTRAVENOUS | Status: DC | PRN
  Administered 2014-11-27 (×3): 5 ug via INTRAVENOUS
  Administered 2014-11-27 (×2): 10 ug via INTRAVENOUS

## 2014-11-27 MED ORDER — SUCCINYLCHOLINE CHLORIDE 20 MG/ML IJ SOLN
INTRAMUSCULAR | Status: DC | PRN
  Administered 2014-11-27: 100 mg via INTRAVENOUS

## 2014-11-27 MED ORDER — MIDAZOLAM HCL 5 MG/5ML IJ SOLN
INTRAMUSCULAR | Status: DC | PRN
  Administered 2014-11-27: 1 mg via INTRAVENOUS

## 2014-11-27 MED ORDER — SODIUM CHLORIDE 0.9 % IJ SOLN
INTRAMUSCULAR | Status: AC
  Filled 2014-11-27: qty 10

## 2014-11-27 MED ORDER — PROPOFOL 10 MG/ML IV BOLUS
INTRAVENOUS | Status: DC | PRN
  Administered 2014-11-27: 150 mg via INTRAVENOUS

## 2014-11-27 MED ORDER — OXYCODONE HCL 5 MG/5ML PO SOLN
5.0000 mg | Freq: Once | ORAL | Status: DC | PRN
  Filled 2014-11-27: qty 5

## 2014-11-27 MED ORDER — EPHEDRINE SULFATE 50 MG/ML IJ SOLN
INTRAMUSCULAR | Status: AC
  Filled 2014-11-27: qty 1

## 2014-11-27 MED ORDER — LIDOCAINE HCL (CARDIAC) 20 MG/ML IV SOLN
INTRAVENOUS | Status: DC | PRN
  Administered 2014-11-27: 100 mg via INTRAVENOUS

## 2014-11-27 MED ORDER — OXYCODONE HCL 5 MG PO TABS: 5.0000 mg | ORAL_TABLET | Freq: Once | ORAL | Status: DC | PRN

## 2014-11-27 MED ORDER — ESMOLOL HCL 10 MG/ML IV SOLN
INTRAVENOUS | Status: DC | PRN
  Administered 2014-11-27: 20 mg via INTRAVENOUS
  Administered 2014-11-27: 10 mg via INTRAVENOUS

## 2014-11-27 MED ORDER — MIDAZOLAM HCL 2 MG/2ML IJ SOLN
INTRAMUSCULAR | Status: AC
  Filled 2014-11-27: qty 4

## 2014-11-27 MED ORDER — ONDANSETRON HCL 4 MG/2ML IJ SOLN
INTRAMUSCULAR | Status: DC | PRN
  Administered 2014-11-27: 4 mg via INTRAVENOUS

## 2014-11-27 MED ORDER — LIDOCAINE HCL (CARDIAC) 20 MG/ML IV SOLN
INTRAVENOUS | Status: AC
  Filled 2014-11-27: qty 5

## 2014-11-27 MED ORDER — SUFENTANIL CITRATE 50 MCG/ML IV SOLN
INTRAVENOUS | Status: AC
  Filled 2014-11-27: qty 1

## 2014-11-27 MED ORDER — DEXAMETHASONE SODIUM PHOSPHATE 10 MG/ML IJ SOLN
INTRAMUSCULAR | Status: DC | PRN
  Administered 2014-11-27: 10 mg via INTRAVENOUS

## 2014-11-27 MED ORDER — MORPHINE SULFATE 1 MG/ML IV SOLN
INTRAVENOUS | Status: AC
  Filled 2014-11-27: qty 25

## 2014-11-27 MED ORDER — ESMOLOL HCL 10 MG/ML IV SOLN
INTRAVENOUS | Status: AC
  Filled 2014-11-27: qty 10

## 2014-11-27 MED ORDER — HYDROMORPHONE HCL 1 MG/ML IJ SOLN
0.2500 mg | INTRAMUSCULAR | Status: DC | PRN
  Administered 2014-11-27 (×2): 0.25 mg via INTRAVENOUS

## 2014-11-27 MED ORDER — ROCURONIUM BROMIDE 100 MG/10ML IV SOLN
INTRAVENOUS | Status: AC
  Filled 2014-11-27: qty 1

## 2014-11-27 MED ORDER — DIPHENHYDRAMINE HCL 50 MG/ML IJ SOLN: 12.5000 mg | Freq: Four times a day (QID) | INTRAMUSCULAR | Status: DC | PRN

## 2014-11-27 MED ORDER — HYDROMORPHONE HCL 1 MG/ML IJ SOLN
INTRAMUSCULAR | Status: AC
  Filled 2014-11-27: qty 1

## 2014-11-27 MED ORDER — PROPOFOL 10 MG/ML IV BOLUS
INTRAVENOUS | Status: AC
  Filled 2014-11-27: qty 20

## 2014-11-27 MED ORDER — SUGAMMADEX SODIUM 200 MG/2ML IV SOLN
INTRAVENOUS | Status: DC | PRN
  Administered 2014-11-27 (×2): 199.6 mg via INTRAVENOUS

## 2014-11-27 MED ORDER — CEFAZOLIN SODIUM-DEXTROSE 2-3 GM-% IV SOLR
INTRAVENOUS | Status: AC
  Filled 2014-11-27: qty 50

## 2014-11-27 MED ORDER — ENOXAPARIN SODIUM 40 MG/0.4ML ~~LOC~~ SOLN
40.0000 mg | SUBCUTANEOUS | Status: DC
  Administered 2014-11-28 – 2014-11-30 (×3): 40 mg via SUBCUTANEOUS
  Filled 2014-11-27 (×4): qty 0.4

## 2014-11-27 MED ORDER — METOCLOPRAMIDE HCL 5 MG/ML IJ SOLN
INTRAMUSCULAR | Status: DC | PRN
  Administered 2014-11-27: 10 mg via INTRAVENOUS

## 2014-11-27 MED ORDER — LACTATED RINGERS IV SOLN
INTRAVENOUS | Status: DC
  Administered 2014-11-27: 1000 mL via INTRAVENOUS
  Administered 2014-11-27: 13:00:00 via INTRAVENOUS

## 2014-11-27 MED ORDER — HYDROMORPHONE HCL 1 MG/ML IJ SOLN: 1.0000 mg | INTRAMUSCULAR | Status: DC | PRN

## 2014-11-27 MED ORDER — METOCLOPRAMIDE HCL 5 MG/ML IJ SOLN
INTRAMUSCULAR | Status: AC
  Filled 2014-11-27: qty 2

## 2014-11-27 SURGICAL SUPPLY — 49 items
APPLICATOR COTTON TIP 6IN STRL (MISCELLANEOUS) ×1 IMPLANT
BINDER ABDOMINAL 12 ML 46-62 (SOFTGOODS) ×2 IMPLANT
BLADE EXTENDED COATED 6.5IN (ELECTRODE) IMPLANT
BLADE HEX COATED 2.75 (ELECTRODE) ×3 IMPLANT
CHLORAPREP W/TINT 26ML (MISCELLANEOUS) ×3 IMPLANT
COVER MAYO STAND STRL (DRAPES) ×2 IMPLANT
COVER SURGICAL LIGHT HANDLE (MISCELLANEOUS) ×3 IMPLANT
DRAIN CHANNEL 19F RND (DRAIN) ×2 IMPLANT
DRAPE LAPAROSCOPIC ABDOMINAL (DRAPES) ×3 IMPLANT
DRAPE WARM FLUID 44X44 (DRAPE) ×2 IMPLANT
DRSG OPSITE POSTOP 4X10 (GAUZE/BANDAGES/DRESSINGS) ×2 IMPLANT
ELECT REM PT RETURN 9FT ADLT (ELECTROSURGICAL) ×3
ELECTRODE REM PT RTRN 9FT ADLT (ELECTROSURGICAL) ×1 IMPLANT
EVACUATOR SILICONE 100CC (DRAIN) ×4 IMPLANT
GAUZE SPONGE 4X4 12PLY STRL (GAUZE/BANDAGES/DRESSINGS) ×3 IMPLANT
GLOVE BIOGEL PI IND STRL 7.0 (GLOVE) ×1 IMPLANT
GLOVE BIOGEL PI INDICATOR 7.0 (GLOVE) ×2
GLOVE SURG ORTHO 8.0 STRL STRW (GLOVE) ×3 IMPLANT
GOWN STRL REUS W/TWL LRG LVL3 (GOWN DISPOSABLE) ×3 IMPLANT
GOWN STRL REUS W/TWL XL LVL3 (GOWN DISPOSABLE) ×6 IMPLANT
KIT BASIN OR (CUSTOM PROCEDURE TRAY) ×3 IMPLANT
MESH BARD SOFT 6X6IN (Mesh General) ×2 IMPLANT
NS IRRIG 1000ML POUR BTL (IV SOLUTION) ×3 IMPLANT
PACK GENERAL/GYN (CUSTOM PROCEDURE TRAY) ×3 IMPLANT
PEN SKIN MARKING BROAD (MISCELLANEOUS) ×3 IMPLANT
SPONGE DRAIN TRACH 4X4 STRL 2S (GAUZE/BANDAGES/DRESSINGS) ×4 IMPLANT
SPONGE LAP 18X18 X RAY DECT (DISPOSABLE) IMPLANT
STAPLER VISISTAT 35W (STAPLE) ×3 IMPLANT
SUCTION POOLE TIP (SUCTIONS) IMPLANT
SUT ETHILON 3 0 PS 1 (SUTURE) ×4 IMPLANT
SUT NOV 1 T60/GS (SUTURE) IMPLANT
SUT NOVA 1 T20/GS 25DT (SUTURE) ×4 IMPLANT
SUT NOVA NAB GS-21 0 18 T12 DT (SUTURE) ×12 IMPLANT
SUT PDS AB 1 CTX 36 (SUTURE) IMPLANT
SUT SILK 2 0 (SUTURE)
SUT SILK 2 0 SH CR/8 (SUTURE) IMPLANT
SUT SILK 2-0 18XBRD TIE 12 (SUTURE) IMPLANT
SUT SILK 3 0 (SUTURE)
SUT SILK 3 0 SH CR/8 (SUTURE) IMPLANT
SUT SILK 3-0 18XBRD TIE 12 (SUTURE) IMPLANT
SUT VIC AB 2-0 CT2 27 (SUTURE) IMPLANT
SUT VIC AB 2-0 SH 18 (SUTURE) ×2 IMPLANT
SUT VICRYL 2 0 18  UND BR (SUTURE)
SUT VICRYL 2 0 18 UND BR (SUTURE) IMPLANT
TAPE CLOTH SURG 4X10 WHT LF (GAUZE/BANDAGES/DRESSINGS) ×2 IMPLANT
TOWEL OR 17X26 10 PK STRL BLUE (TOWEL DISPOSABLE) ×6 IMPLANT
TRAY FOLEY W/METER SILVER 14FR (SET/KITS/TRAYS/PACK) IMPLANT
WATER STERILE IRR 1500ML POUR (IV SOLUTION) ×1 IMPLANT
YANKAUER SUCT BULB TIP NO VENT (SUCTIONS) IMPLANT

## 2014-11-27 NOTE — Anesthesia Postprocedure Evaluation (Signed)
Anesthesia Post Note  Patient: Hailey Shields  Procedure(s) Performed: Procedure(s) (LRB): EXPLORATORY LAPAROTOMY (N/A) HERNIA REPAIR VENTRAL ADULT (N/A) INSERTION OF MESH (N/A)  Anesthesia type: General  Patient location: PACU  Post pain: Pain level controlled and Adequate analgesia  Post assessment: Post-op Vital signs reviewed, Patient's Cardiovascular Status Stable, Respiratory Function Stable, Patent Airway and Pain level controlled  Last Vitals:  Filed Vitals:   11/27/14 1545  BP: 147/76  Pulse: 100  Temp: 36.6 C  Resp: 16    Post vital signs: Reviewed and stable  Level of consciousness: awake, alert  and oriented  Complications: No apparent anesthesia complications

## 2014-11-27 NOTE — Anesthesia Preprocedure Evaluation (Signed)
Anesthesia Evaluation  Patient identified by MRN, date of birth, ID band Patient awake    Reviewed: Allergy & Precautions, NPO status , Patient's Chart, lab work & pertinent test results  Airway Mallampati: II   Neck ROM: full    Dental   Pulmonary neg pulmonary ROS,  breath sounds clear to auscultation        Cardiovascular hypertension, Rhythm:regular Rate:Normal     Neuro/Psych    GI/Hepatic   Endo/Other  Morbid obesity  Renal/GU      Musculoskeletal   Abdominal   Peds  Hematology   Anesthesia Other Findings   Reproductive/Obstetrics                             Anesthesia Physical Anesthesia Plan  ASA: II  Anesthesia Plan: General   Post-op Pain Management:    Induction: Intravenous  Airway Management Planned: Oral ETT  Additional Equipment:   Intra-op Plan:   Post-operative Plan: Extubation in OR  Informed Consent: I have reviewed the patients History and Physical, chart, labs and discussed the procedure including the risks, benefits and alternatives for the proposed anesthesia with the patient or authorized representative who has indicated his/her understanding and acceptance.     Plan Discussed with: CRNA, Anesthesiologist and Surgeon  Anesthesia Plan Comments:         Anesthesia Quick Evaluation  

## 2014-11-27 NOTE — Transfer of Care (Signed)
Immediate Anesthesia Transfer of Care Note  Patient: Hailey Shields  Procedure(s) Performed: Procedure(s): EXPLORATORY LAPAROTOMY (N/A) HERNIA REPAIR VENTRAL ADULT (N/A) INSERTION OF MESH (N/A)  Patient Location: PACU  Anesthesia Type:General  Level of Consciousness:  sedated, patient cooperative and responds to stimulation  Airway & Oxygen Therapy:Patient Spontanous Breathing and Patient connected to face mask oxgen  Post-op Assessment:  Report given to PACU RN and Post -op Vital signs reviewed and stable  Post vital signs:  Reviewed and stable  Last Vitals:  Filed Vitals:   11/27/14 0910  BP: 142/64  Pulse: 94  Temp: 36.6 C  Resp: 18    Complications: No apparent anesthesia complications

## 2014-11-27 NOTE — Progress Notes (Signed)
Patient ID: Hailey Shields, female   DOB: 1946-01-08, 69 y.o.   MRN: [id number]  General Surgery - [facility]  HD#: 2  Subjective: Patient in bed, daughter and nursing staff at bedside.  Comfortable.  Passing gas.  Objective: Vital signs in last 24 hours: Temp:  [98.2 F (36.8 C)-98.9 F (37.2 C)] 98.4 F (36.9 C) (10/11 0526) Pulse Rate:  [79-94] 85 (10/11 0526) Resp:  [16-19] 16 (10/11 0526) BP: (125-161)/(55-77) 129/63 mmHg (10/11 0526) SpO2:  [92 %-98 %] 97 % (10/11 0526) Last BM Date: 11/25/14  Intake/Output from previous day: 10/10 0701 - 10/11 0700 In: 1906.3 [I.V.:1906.3] Out: 1600 [Emesis/NG output:1600] Intake/Output this shift:    Physical Exam: HEENT - sclerae clear, mucous membranes moist Neck - soft Chest - clear bilaterally Cor - RRR Abdomen - soft, obese; hernia RLQ reducible Ext - no edema, non-tender Neuro - alert & oriented, no focal deficits  Lab Results:   Recent Labs  11/26/14 0902 11/27/14 0623  WBC 16.9* 8.7  HGB 15.0 12.9  HCT 43.6 39.0  PLT 263 230   BMET  Recent Labs  11/26/14 0902 11/27/14 0623  NA 138 140  K 3.8 3.7  CL 102 105  CO2 25 29  GLUCOSE 190* 135*  BUN 21* 24*  CREATININE 0.68 0.61  CALCIUM 9.5 8.8*   PT/INR No results for input(s): LABPROT, INR in the last 72 hours. Comprehensive Metabolic Panel:    Component Value Date/Time   NA 140 11/27/2014 0623   NA 138 11/26/2014 0902   K 3.7 11/27/2014 0623   K 3.8 11/26/2014 0902   CL 105 11/27/2014 0623   CL 102 11/26/2014 0902   CO2 29 11/27/2014 0623   CO2 25 11/26/2014 0902   BUN 24* 11/27/2014 0623   BUN 21* 11/26/2014 0902   CREATININE 0.61 11/27/2014 0623   CREATININE 0.68 11/26/2014 0902   GLUCOSE 135* 11/27/2014 0623   GLUCOSE 190* 11/26/2014 0902   CALCIUM 8.8* 11/27/2014 0623   CALCIUM 9.5 11/26/2014 0902   AST 24 07/15/2006 1435   AST 20 01/05/2006 1415   ALT 29 07/15/2006 1435   ALT 24 01/05/2006 1415   ALKPHOS 70  07/15/2006 1435   ALKPHOS 70 01/05/2006 1415   BILITOT 0.6 07/15/2006 1435   BILITOT 0.7 01/05/2006 1415   PROT 7.0 07/15/2006 1435   PROT 6.9 01/05/2006 1415   ALBUMIN 4.4 07/15/2006 1435   ALBUMIN 4.3 01/05/2006 1415    Studies/Results: No results found.  Anti-infectives: Anti-infectives    Start     Dose/Rate Route Frequency Ordered Stop   11/27/14 0600  ceFAZolin (ANCEF) IVPB 2 g/50 mL premix     2 g 100 mL/hr over 30 Minutes Intravenous On call to O.R. 11/26/14 1442 11/28/14 0559      Assessment & Plans: Recurrent ventral incisional hernia with SBO  Reviewed CT [facility] from [facility] with mesh by open technique   The risks and benefits of the procedure have been discussed at length with the patient.  The patient understands the proposed procedure, potential alternative treatments, and the course of recovery to be expected.  All of the patient's questions have been answered at this time.  The patient wishes to proceed with surgery.  Velora Heckler, MD, [facility] Office: [phone]   Hailey Shields 11/27/2014

## 2014-11-27 NOTE — Brief Op Note (Signed)
11/26/2014 - 11/27/2014  2:37 PM  PATIENT:  Hailey Shields  69 y.o. female  PRE-OPERATIVE DIAGNOSIS:  Recurrent ventral incisional hernia, small bowel obstruction  POST-OPERATIVE DIAGNOSIS:  same  PROCEDURE:  Procedure(s): EXPLORATORY LAPAROTOMY (N/A) HERNIA REPAIR VENTRAL ADULT (N/A) INSERTION OF MESH (N/A)  SURGEON:  Surgeon(s) and Role:    * Darnell Level, MD - Primary    * Ovidio Kin, MD - Assisting  ANESTHESIA:   general  EBL:  Total I/O In: 2100 [I.V.:2100] Out: 695 [Urine:475; Emesis/NG output:200; Blood:20]  BLOOD ADMINISTERED:none  DRAINS: (2) Blake drain(s) in the subcutaneous space   LOCAL MEDICATIONS USED:  NONE  SPECIMEN:  No Specimen  DISPOSITION OF SPECIMEN:  N/A  COUNTS:  YES  TOURNIQUET:  * No tourniquets in log *  DICTATION: .Other Dictation: Dictation Number [id number]  PLAN OF CARE: Admit to inpatient   PATIENT DISPOSITION:  PACU - hemodynamically stable.   Delay start of Pharmacological VTE agent (>24hrs) due to surgical blood loss or risk of bleeding: yes  Velora Heckler, MD, [facility] Office: [phone]

## 2014-11-27 NOTE — Anesthesia Procedure Notes (Signed)
Procedure Name: Intubation Date/Time: 11/27/2014 12:40 PM Performed by: Delayla Hoffmaster, Nuala Alpha Pre-anesthesia Checklist: Patient identified, Emergency Drugs available, Suction available, Patient being monitored and Timeout performed Patient Re-evaluated:Patient Re-evaluated prior to inductionOxygen Delivery Method: Circle system utilized Preoxygenation: Pre-oxygenation with 100% oxygen Intubation Type: IV induction Laryngoscope Size: Mac and 3 Grade View: Grade II Tube type: Oral Number of attempts: 1 Airway Equipment and Method: Stylet Placement Confirmation: ETT inserted through vocal cords under direct vision,  positive ETCO2 and CO2 detector Secured at: 24 (at lips ) cm Tube secured with: Tape Dental Injury: Teeth and Oropharynx as per pre-operative assessment

## 2014-11-28 LAB — BASIC METABOLIC PANEL
ANION GAP: 7 (ref 5–15)
BUN: 13 mg/dL (ref 6–20)
CALCIUM: 8.7 mg/dL — AB (ref 8.9–10.3)
CO2: 25 mmol/L (ref 22–32)
CREATININE: 0.46 mg/dL (ref 0.44–1.00)
Chloride: 105 mmol/L (ref 101–111)
Glucose, Bld: 116 mg/dL — ABNORMAL HIGH (ref 65–99)
Potassium: 4.1 mmol/L (ref 3.5–5.1)
Sodium: 137 mmol/L (ref 135–145)

## 2014-11-28 LAB — CBC
HCT: 36.7 % (ref 36.0–46.0)
Hemoglobin: 12.4 g/dL (ref 12.0–15.0)
MCH: 31.2 pg (ref 26.0–34.0)
MCHC: 33.8 g/dL (ref 30.0–36.0)
MCV: 92.4 fL (ref 78.0–100.0)
Platelets: 231 10*3/uL (ref 150–400)
RBC: 3.97 MIL/uL (ref 3.87–5.11)
RDW: 12.6 % (ref 11.5–15.5)
WBC: 9.6 10*3/uL (ref 4.0–10.5)

## 2014-11-28 NOTE — Op Note (Signed)
NAMMorton Peters:  Vollrath, Camri                   ACCOUNT NO.:  [id number]  MEDICAL RECORD NO.:  [id number]  LOCATION:  1536                         [facility]  PHYSICIAN:  Velora Hecklerodd M Anevay Campanella, MD      DATE OF BIRTH:  [birth year]  DATE OF PROCEDURE:  11/27/2014                              OPERATIVE REPORT   PREOPERATIVE DIAGNOSIS:  Recurrent ventral incisional hernia, small bowel obstruction.  POSTOPERATIVE DIAGNOSIS:  Recurrent ventral incisional hernia, small bowel obstruction.  PROCEDURE:  Repair of recurrent ventral incisional hernia with Bard soft mesh (6 inch x 6 inch sheet).  SURGEON:  Velora Hecklerodd M. Gwynn Crossley, MD, FACS  ASSISTANT:  Ovidio Kinavid Newman, MD, FACS  ANESTHESIA:  General.  ESTIMATED BLOOD LOSS:  Minimal.  PREPARATION:  ChloraPrep.  COMPLICATIONS:  None.  INDICATIONS:  The patient is a 69 year old female, having undergone previous laparoscopic ventral incisional hernia repair in 2008.  The patient presented to the emergency department with small bowel obstruction and CT scan showing recurrent ventral incisional hernia. The patient now comes to the operating room for repair.  BODY OF REPORT:  [facility]  The patient was brought to the operating room, placed in supine position on the operating room table.  Following administration of general anesthesia, the patient was prepped and draped in usual aseptic fashion.  After ascertaining that an adequate level of anesthesia had been achieved, the previous midline incision was reopened from the level of the umbilicus towards the pubis.  Dissection was carried in subcutaneous tissues and the hernia sac was identified. Hemostasis was achieved with electrocautery.  The fascial defect appears to be in the low midline.  Hernia sac was dissected out of the subcutaneous tissues down to the level of the fascia.  The fascial plane was then developed circumferentially, using Rakes to elevate  skin flaps. The midline incision from the umbilicus down to the hernia is quite attenuated with visible suture material, but no true recurrent fascial defect.  Fascial defect appears to be at the below the level of the previous mesh and measures approximately 5 cm in diameter.  Small bowel loops are reduced out of the hernia sac and hernia sac was excised and discarded.  The fascial defect was then closed with interrupted 0 Novafil simple sutures.  Next, the eventration of the lower midline incision is reapproximated by imbricating the edges of the rectus muscles in the midline with interrupted #1 Novafil simple sutures.  Relaxing incisions were placed over the rectus musculature bilaterally.  Next, a 6 inch x 6 inch sheet of Bard soft mesh is was placed in the wound.  The corners are trimmed slightly with scissors.  Mesh was then secured to the anterior abdominal wall, so as to overlap the entire area of repair in the midline as well as the relaxing incisions laterally. The mesh was secured to the chest wall with interrupted 0 Novafil simple sutures circumferentially.  19-French Blake drains are brought in from bilateral inferior stab wounds and placed in the subcutaneous space, immediately anterior to the mesh.  Drains were secured to the skin with 3-0 nylon sutures.  The wound was irrigated with warm saline, which was evacuated.  Good hemostasis was noted.  Subcutaneous tissues were closed in the midline with interrupted 2-0 Vicryl sutures.  Skin was closed with stainless steel staples.  Drains were placed to bulb suction.  A [place] Cone dressing was placed over the midline wound.  The patient was placed in an abdominal binder, upon leaving the operating room table. The patient was then transported to the recovery room in stable condition.  The patient tolerated the procedure well.   Velora Heckler, MD, [facility] Office: [phone]    TMG/MEDQ  D:   11/27/2014  T:  11/28/2014  Job:  [id number]

## 2014-11-28 NOTE — Progress Notes (Signed)
Patient ID: Hailey Shields, female   DOB: 08/23/1945, 69 y.o.   MRN: [id number]  General [facility] - [facility]  POD#: 1  Subjective: Patient comfortable in bed.  Daughter at bedside.  Objective: Vital signs in last 24 hours: Temp:  [97.9 F (36.6 C)-99.1 F (37.3 C)] 99.1 F (37.3 C) (10/12 0609) Pulse Rate:  [80-105] 94 (10/12 0609) Resp:  [12-22] 18 (10/12 0800) BP: (127-169)/(61-83) 129/61 mmHg (10/12 0609) SpO2:  [92 %-100 %] 97 % (10/12 0800) Weight:  [106.6 kg (235 lb 0.2 oz)] 106.6 kg (235 lb 0.2 oz) (10/12 0541) Last BM Date: 11/26/14  Intake/Output from previous day: 10/11 0701 - 10/12 0700 In: 3491.7 [I.V.:3491.7] Out: 2355 [Urine:1840; Emesis/NG output:400; Drains:95; Blood:20] Intake/Output this shift:    Physical Exam: HEENT - sclerae clear, mucous membranes moist Neck - soft Chest - clear bilaterally Cor - RRR Abdomen - soft, obese; few BS present; NG with thin bilious; midline wound intact with dry dressing; serosang in JP's Ext - no edema, non-tender Neuro - alert & oriented, no focal deficits  Lab Results:   Recent Labs  11/27/14 0623 11/28/14 0530  WBC 8.7 9.6  HGB 12.9 12.4  HCT 39.0 36.7  PLT 230 231   BMET  Recent Labs  11/27/14 0623 11/28/14 0530  NA 140 137  K 3.7 4.1  CL 105 105  CO2 29 25  GLUCOSE 135* 116*  BUN 24* 13  CREATININE 0.61 0.46  CALCIUM 8.8* 8.7*   PT/INR No results for input(s): LABPROT, INR in the last 72 hours. Comprehensive Metabolic Panel:    Component Value Date/Time   NA 137 11/28/2014 0530   NA 140 11/27/2014 0623   K 4.1 11/28/2014 0530   K 3.7 11/27/2014 0623   CL 105 11/28/2014 0530   CL 105 11/27/2014 0623   CO2 25 11/28/2014 0530   CO2 29 11/27/2014 0623   BUN 13 11/28/2014 0530   BUN 24* 11/27/2014 0623   CREATININE 0.46 11/28/2014 0530   CREATININE 0.61 11/27/2014 0623   GLUCOSE 116* 11/28/2014 0530   GLUCOSE 135* 11/27/2014 0623   CALCIUM 8.7* 11/28/2014 0530   CALCIUM 8.8* 11/27/2014 0623   AST 24 07/15/2006 1435   AST 20 01/05/2006 1415   ALT 29 07/15/2006 1435   ALT 24 01/05/2006 1415   ALKPHOS 70 07/15/2006 1435   ALKPHOS 70 01/05/2006 1415   BILITOT 0.6 07/15/2006 1435   BILITOT 0.7 01/05/2006 1415   PROT 7.0 07/15/2006 1435   PROT 6.9 01/05/2006 1415   ALBUMIN 4.4 07/15/2006 1435   ALBUMIN 4.3 01/05/2006 1415    Studies/Results: No results found.  Anti-infectives: Anti-infectives    Start     Dose/Rate Route Frequency Ordered Stop   11/27/14 0600  ceFAZolin (ANCEF) IVPB 2 g/50 mL premix     2 g 100 mL/hr over 30 Minutes Intravenous On call to O.R. 11/26/14 1442 11/27/14 1250      Assessment & Plans: Status post repair recurrent ventral incisional hernia with mesh  Discontinue NG tube  Allow sips of clear liquids  OOB to chair, ambulate in halls  Abdominal binder in place  Velora Hecklerodd M. Calab Sachse, MD, [facility] Office: [phone]   [name] 11/28/2014

## 2014-11-28 NOTE — Care Management Note (Signed)
Case Management Note  Patient Details  Name: Glo HerringJudy C Palo MRN: [id number] Date of Birth: 28-Aug-1945  Subjective/Objective:              Admitted with SBO      Action/Plan: Discharge planning  Expected Discharge Date:                  Expected Discharge Plan:  Home/Self Care  In-House Referral:  NA  Discharge planning Services  CM Consult  Post Acute Care Choice:  NA Choice offered to:  NA  DME Arranged:  N/A DME Agency:  NA  HH Arranged:  NA HH Agency:  NA  Status of Service:  Completed, signed off  Medicare Important Message Given:    Date Medicare IM Given:    Medicare IM give by:    Date Additional Medicare IM Given:    Additional Medicare Important Message give by:     If discussed at Long Length of Stay Meetings, dates discussed:    Additional Comments:  Alexis Goodelleele, Wynema Garoutte K, RN 11/28/2014, 10:43 AM

## 2014-11-28 NOTE — Care Management Important Message (Signed)
Important Message  Patient Details  Name: Glo HerringJudy C Schipani MRN: [id number] Date of Birth: [birth year]   Medicare Important Message Given:  Minimally In[place] Surgery [place]-second notification given    Haskell FlirtJamison, Sya Nestler 11/28/2014, 11:39 AMImportant Message  Patient Details  Name: Glo HerringJudy C Maker MRN: [id number] Date of Birth: [birth year]   Medicare Important Message Given:  Yes-second notification given    Haskell FlirtJamison, Davonte Siebenaler 11/28/2014, 11:39 AM

## 2014-11-29 MED ORDER — OXYCODONE-ACETAMINOPHEN 5-325 MG PO TABS: 1.0000 | ORAL_TABLET | ORAL | Status: DC | PRN

## 2014-11-29 MED ORDER — HYDROMORPHONE HCL 1 MG/ML IJ SOLN: 0.5000 mg | INTRAMUSCULAR | Status: DC | PRN

## 2014-11-29 NOTE — Progress Notes (Signed)
IVT unable to restart IV. [place] aware and states may leave out for now will revaluate later today.

## 2014-11-29 NOTE — Progress Notes (Signed)
She is doing well with the clears, unable to start IV, so she needs a PICC line to get access.  I will leave her on clears, she hasn't taken anything for pain.  She has a few very hypoactive BS this PM, i didn't hear any this morning.

## 2014-11-29 NOTE — Progress Notes (Signed)
2 Days Post-Op  Subjective: IV is out and she is a very difficult stick.  She is doing ok with clears, but hasn't had much so far.  Some flatus, NO BS this AM.,  Objective: Vital signs in last 24 hours: Temp:  [97.1 F (36.2 C)-98.7 F (37.1 C)] 97.1 F (36.2 C) (10/13 0630) Pulse Rate:  [70-77] 75 (10/13 0630) Resp:  [15-22] 21 (10/13 0753) BP: (130-146)/(56-99) 131/64 mmHg (10/13 0630) SpO2:  [93 %-99 %] 96 % (10/13 0753) Last BM Date: 11/26/14 NG out  Diet:  clears  drain 130 TM 99.1, VSS Labs OK 11/28/14 Intake/Output from previous day: 10/12 0701 - 10/13 0700 In: 2400 [I.V.:2400] Out: 3055 [Urine:2725; Emesis/NG output:200; Drains:130] Intake/Output this shift:    General appearance: alert, cooperative and no distress Resp: clear to auscultation bilaterally GI: soft, sites are ok, drain is serous, no BS, No BM so far.    Lab Results:   Recent Labs  11/27/14 0623 11/28/14 0530  WBC 8.7 9.6  HGB 12.9 12.4  HCT 39.0 36.7  PLT 230 231    BMET  Recent Labs  11/27/14 0623 11/28/14 0530  NA 140 137  K 3.7 4.1  CL 105 105  CO2 29 25  GLUCOSE 135* 116*  BUN 24* 13  CREATININE 0.61 0.46  CALCIUM 8.8* 8.7*   PT/INR No results for input(s): LABPROT, INR in the last 72 hours.  No results for input(s): AST, ALT, ALKPHOS, BILITOT, PROT, ALBUMIN in the last 168 hours.   Lipase  No results found for: LIPASE   Studies/Results: No results found.  Medications: . chlorhexidine  15 mL Mouth Rinse BID  . enoxaparin (LOVENOX) injection  40 mg Subcutaneous Q24H  . morphine   Intravenous 6 times per day  . pantoprazole (PROTONIX) IV  40 mg Intravenous QHS    Assessment/Plan Recurrent ventral incisional hernia, small bowel obstruction. Repair of recurrent ventral incisional hernia with Bard soft mesh (6 inch x 6 inch sheet), 11/28/14, Dr. Darnell Levelodd Gerkin Hx of hypertension Hx of ovarian cancer Antibiotics:  Cefazolin, pre op only DVT:  Lovenox/SCD   Plan:  I  will stop her PCA, I think we need to keep the IV going for now.  I have ask them to call IV team.  Keep her on clears and mobilize her more.      LOS: 3 days    Hailey Shields 11/29/2014

## 2014-11-30 NOTE — Progress Notes (Signed)
Patient ID: Hailey CopaJudy M Joss, female   DOB: [birth year], 69 y.o.   MRN: [id number]  [facility]  POD#: 3  Subjective: Patient up in chair, taking clear liquid breakfast.  No nausea or emesis.  Ambulated in halls yesterday.  Objective: Vital signs in last 24 hours: Temp:  [97.9 F (36.6 C)-99.5 F (37.5 C)] 97.9 F (36.6 C) (10/14 0528) Pulse Rate:  [76-87] 76 (10/14 0528) Resp:  [18-20] 19 (10/14 0528) BP: (135-176)/(49-105) 135/73 mmHg (10/14 0528) SpO2:  [94 %-96 %] 96 % (10/14 0528) Last BM Date: 11/26/14  Intake/Output from previous day: 10/13 0701 - 10/14 0700 In: 1440 [P.O.:1440] Out: 3760 [Urine:3650; Drains:110] Intake/Output this shift:    Physical Exam: HEENT - sclerae clear, mucous membranes moist Neck - soft Chest - clear bilaterally Cor - RRR Abdomen - soft, obese; binder on; JP drains with thin serosanguinous; active BS present Ext - no edema, non-tender Neuro - alert & oriented, no focal deficits  Lab Results:   Recent Labs  11/28/14 0530  WBC 9.6  HGB 12.4  HCT 36.7  PLT 231   BMET  Recent Labs  11/28/14 0530  NA 137  K 4.1  CL 105  CO2 25  GLUCOSE 116*  BUN 13  CREATININE 0.46  CALCIUM 8.7*   PT/INR No results for input(s): LABPROT, INR in the last 72 hours. Comprehensive Metabolic Panel:    Component Value Date/Time   NA 137 11/28/2014 0530   NA 140 11/27/2014 0623   K 4.1 11/28/2014 0530   K 3.7 11/27/2014 0623   CL 105 11/28/2014 0530   CL 105 11/27/2014 0623   CO2 25 11/28/2014 0530   CO2 29 11/27/2014 0623   BUN 13 11/28/2014 0530   BUN 24* 11/27/2014 0623   CREATININE 0.46 11/28/2014 0530   CREATININE 0.61 11/27/2014 0623   GLUCOSE 116* 11/28/2014 0530   GLUCOSE 135* 11/27/2014 0623   CALCIUM 8.7* 11/28/2014 0530   CALCIUM 8.8* 11/27/2014 0623   AST 24 07/15/2006 1435   AST 20 01/05/2006 1415   ALT 29 07/15/2006 1435   ALT 24 01/05/2006 1415   ALKPHOS 70 07/15/2006 1435   ALKPHOS  70 01/05/2006 1415   BILITOT 0.6 07/15/2006 1435   BILITOT 0.7 01/05/2006 1415   PROT 7.0 07/15/2006 1435   PROT 6.9 01/05/2006 1415   ALBUMIN 4.4 07/15/2006 1435   ALBUMIN 4.3 01/05/2006 1415    Studies/Results: No results found.  Anti-infectives: Anti-infectives    Start     Dose/Rate Route Frequency Ordered Stop   11/27/14 0600  ceFAZolin (ANCEF) IVPB 2 g/50 mL premix     2 g 100 mL/hr over 30 Minutes Intravenous On call to O.R. 11/26/14 1442 11/27/14 1250      Assessment & Plans: Status post recurrent ventral incisional hernia repair with mesh  Advance diet  OOB, ambulate  Try to avoid additional IV access  Likely home tomorrow  Velora Hecklerodd M. Ellyanna Holton, MD, [facility] Office: [phone]   Barbar Brede Judie PetitM 11/30/2014

## 2014-12-01 MED ORDER — HYDROCODONE-ACETAMINOPHEN 5-325 MG PO TABS: 1.0000 | ORAL_TABLET | ORAL | Status: DC | PRN

## 2014-12-01 NOTE — Progress Notes (Signed)
Assessment unchanged. Pt verbalized understanding of dc instructions through teach back including follow up care and when to call the doctor. Scripts x 1 given to pt as provided by MD. JP drain dressings changed prior to dc. Supplies for drain care and daily dressing changes provided. Pt verbalized understanding of how to empty and recharge bulb drain stating, " I did it yesterday with my nurse." Discharged via wc to front entrance to meet son and awaiting vehicle to carry home. Accompanied by NT and husband.

## 2014-12-01 NOTE — Discharge Summary (Signed)
Physician Discharge Summary [facility]  Patient ID: Hailey Shields MRN: [id number] DOB/AGE: October 30, 1945 69 y.o.  Admit date: 11/26/2014 Discharge date: 12/01/2014  Admission Diagnoses:  Recurrent ventral incisional hernia with small bowel obstruction  Discharge Diagnoses:  Principal Problem:   Small bowel obstruction (HCC) Active Problems:   Incarcerated incisional hernia   Discharged Condition: good  Hospital Course: patient admitted to surgery service.  Underwent repair of recurrent ventral incisional hernia with mesh.  Post op course uncomplicated with resolution of ileus.  Ambulating in halls.  Tolerating diet.  Prepared for discharge on POD#5.  Drains remain in place and will be removed in office when output decreases.  Consults: None  Treatments: surgery: open repair of recurrent ventral incisional hernia with mesh  Discharge Exam: Blood pressure 138/69, pulse 74, temperature 98.4 F (36.9 C), temperature source Oral, resp. rate 18, height  (1.6 m), weight 106.6 kg (235 lb 0.2 oz), SpO2 97 %. HEENT - clear Neck - soft Chest - clear bilaterally Cor - RRR Abd - wound intact with staples; JP drains with thin serosanguinous output  Disposition: Home  Discharge Instructions    Diet - low sodium heart healthy    Complete by:  As directed      Discharge instructions    Complete by:  As directed   [facility]  HERNIA REPAIR POST OP INSTRUCTIONS  Always review your discharge instruction sheet given to you by the facility where your surgery was performed.  A  prescription for pain medication may be given to you upon discharge.  Take your pain medication as prescribed.  If narcotic pain medicine is not needed, then you may take acetaminophen (Tylenol) or ibuprofen (Advil) as needed.  Take your usually prescribed medications unless otherwise directed.  If you need a refill on your pain medication, please contact your pharmacy.  They  will contact our office to request authorization. Prescriptions will not be filled after 5 pm daily or on weekends.  You should follow a light diet the first 24 hours after arrival home, such as soup and crackers or toast.  Be sure to include plenty of fluids daily.  Resume your normal diet the day after surgery.  Most patients will experience some swelling and bruising around the surgical site.  Ice packs and reclining will help.  Swelling and bruising can take several days to resolve.   It is common to experience some constipation if taking pain medication after surgery.  Increasing fluid intake and taking a stool softener (such as Colace) will usually help or prevent this problem from occurring.  A mild laxative (Milk of Magnesia or Miralax) should be taken according to package directions if there are no bowel movements after 48 hours.  Unless discharge instructions indicate otherwise, you may remove your bandages 24-48 hours after surgery, and you may shower at that time.  You will have steri-strips (small skin tapes) in place directly over the incision.  These strips should be left on the skin for 5-7 days.  Any sutures or staples will be removed at the office during your follow-up visit.  ACTIVITIES:  You may resume regular (light) daily activities beginning the next day - such as daily self-care, walking, climbing stairs - gradually increasing activities as tolerated.  You may have sexual intercourse when it is comfortable.  Refrain from any heavy lifting or straining until approved by your doctor.  You may drive when you are no longer taking prescription pain  medication, you can comfortably wear a seatbelt, and you can safely maneuver your car and apply brakes.  You should see your doctor in the office for a follow-up appointment approximately 2-3 weeks after your surgery.  Make sure that you call for this appointment within a day or two after you arrive home to insure a convenient appointment  time.   WHEN TO CALL YOUR DOCTOR: Fever greater than 101.0 Inability to urinate Persistent nausea and/or vomiting Extreme swelling or bruising Continued bleeding from incision Increased pain, redness, or drainage from the incision  The clinic staff is available to answer your questions during regular business hours.  Please don't hesitate to call and ask to speak to one of the nurses for clinical concerns.  If you have a medical emergency, go to the nearest emergency room or call 911.  [facility] is always on call for the hospital.   [facility] [address], [address], [place], [place]  [address]  [phone] ? [phone] ? FAX [phone]  [email or web address]     Discharge wound care:    Complete by:  As directed   Drain care as instructed.  Midline wound may be left open to air.  Record output from drains.  May shower.  Wear abdominal binder all times except when bathing.     Increase activity slowly    Complete by:  As directed             Medication List    TAKE these medications        aspirin 81 MG tablet  Take 81 mg by mouth daily.     HYDROcodone-acetaminophen 5-325 MG tablet  Commonly known as:  NORCO/VICODIN  Take 1-2 tablets by mouth every 4 (four) hours as needed for moderate pain.     lisinopril-hydrochlorothiazide 20-12.5 MG tablet  Commonly known as:  PRINZIDE,ZESTORETIC  Take 1 tablet by mouth daily.     meloxicam 7.5 MG tablet  Commonly known as:  MOBIC  Take 1-2 tablets daily as needed for wrist pain.     multivitamin tablet  Take 1 tablet by mouth daily.           Follow-up Information    Follow up with Velora HecklerGERKIN,Atonya Templer M, MD In 5 days.   Specialty:  General Surgery   Why:  For wound re-check   Contact information:   [address] [place] [address] [phone]       Velora Hecklerodd M. Jaedynn Bohlken, MD, [facility] Office:  [phone]   Signed: Velora HecklerGERKIN,Maloree Uplinger M 12/01/2014, 10:56 AM

## 2014-12-24 ENCOUNTER — Inpatient Hospital Stay: Admit: 2014-12-24 | Payer: Medicare Other | Admitting: Orthopedic Surgery

## 2014-12-24 SURGERY — ARTHROPLASTY, KNEE, TOTAL
Anesthesia: Spinal | Laterality: Left

## 2015-08-07 ENCOUNTER — Encounter (HOSPITAL_COMMUNITY)
Admission: RE | Admit: 2015-08-07 | Discharge: 2015-08-07 | Disposition: A | Discharge status: Home / Self Care | Payer: Medicare Other | Source: Ambulatory Visit | Attending: Orthopedic Surgery | Admitting: Orthopedic Surgery

## 2015-08-07 ENCOUNTER — Encounter (HOSPITAL_COMMUNITY): Payer: Self-pay

## 2015-08-07 DIAGNOSIS — R9431 Abnormal electrocardiogram [ECG] [EKG]: Secondary | ICD-10-CM | POA: Insufficient documentation

## 2015-08-07 DIAGNOSIS — Z01812 Encounter for preprocedural laboratory examination: Secondary | ICD-10-CM | POA: Insufficient documentation

## 2015-08-07 DIAGNOSIS — Z01818 Encounter for other preprocedural examination: Secondary | ICD-10-CM | POA: Insufficient documentation

## 2015-08-07 DIAGNOSIS — M1712 Unilateral primary osteoarthritis, left knee: Secondary | ICD-10-CM | POA: Insufficient documentation

## 2015-08-07 HISTORY — DX: Carpal tunnel syndrome, right upper limb: G56.01

## 2015-08-07 HISTORY — DX: Personal history of antineoplastic chemotherapy: Z92.21

## 2015-08-07 HISTORY — DX: Personal history of urinary calculi: Z87.442

## 2015-08-07 HISTORY — DX: Presence of spectacles and contact lenses: Z97.3

## 2015-08-07 LAB — CBC
HCT: 39.3 % (ref 36.0–46.0)
Hemoglobin: 13.9 g/dL (ref 12.0–15.0)
MCH: 31.2 pg (ref 26.0–34.0)
MCHC: 35.4 g/dL (ref 30.0–36.0)
MCV: 88.1 fL (ref 78.0–100.0)
PLATELETS: 290 10*3/uL (ref 150–400)
RBC: 4.46 MIL/uL (ref 3.87–5.11)
RDW: 13 % (ref 11.5–15.5)
WBC: 5.8 10*3/uL (ref 4.0–10.5)

## 2015-08-07 LAB — BASIC METABOLIC PANEL
Anion gap: 7 (ref 5–15)
BUN: 15 mg/dL (ref 6–20)
CALCIUM: 9.4 mg/dL (ref 8.9–10.3)
CO2: 26 mmol/L (ref 22–32)
CREATININE: 0.59 mg/dL (ref 0.44–1.00)
Chloride: 106 mmol/L (ref 101–111)
GFR calc Af Amer: >60 mL/min (ref >60)
GFR calc non Af Amer: >60 mL/min (ref >60)
GLUCOSE: 116 mg/dL — AB (ref 65–99)
Potassium: 3.8 mmol/L (ref 3.5–5.1)
Sodium: 139 mmol/L (ref 135–145)

## 2015-08-07 LAB — SURGICAL PCR SCREEN
MRSA, PCR: NEGATIVE
Staphylococcus aureus: NEGATIVE

## 2015-08-07 NOTE — Patient Instructions (Signed)
Cammy CopaJudy M Hagberg  08/07/2015   Your procedure is scheduled on: Tuesday August 27, 2015  [facility]  Entrance [place]  elevators to 3rd floor to  Short Stay Center at 12:45 PM.  Call this number if you have problems the morning of surgery [phone]   Remember: ONLY 1 PERSON MAY GO WITH YOU TO SHORT STAY TO GET  READY MORNING OF YOUR SURGERY.  Do not eat food After Midnight but may take clear liquids till 9:45 am day of surgery then nothing by mouth.      Take these medicines the morning of surgery with A SIP OF WATER: NONE                                You may not have any metal on your body including hair pins and              piercings  Do not wear jewelry, make-up, lotions, powders or perfumes, deodorant             Do not wear nail polish.  Do not shave  48 hours prior to surgery.             Do not bring valuables to the hospital. [place] IS NOT             RESPONSIBLE   FOR VALUABLES.  Contacts, dentures or bridgework may not be worn into surgery.  Leave suitcase in the car. After surgery it may be brought to your room.                Please read over the following fact sheets you were given:MRSA INFORMATION SHEET; INCENTIVE SPIROMETER; BLOOD TRANSFUSION INFORMATION SHEET  _____________________________________________________________________             [facility] - Preparing [facility] Before surgery, you can play an important role.  Because skin is not sterile, your skin needs to be as free of germs as possible.  You can reduce the number of germs on your skin by washing with CHG (chlorahexidine gluconate) soap before surgery.  CHG is an antiseptic cleaner which kills germs and bonds with the skin to continue killing germs even after washing. Please DO NOT use if you have an allergy to CHG or antibacterial soaps.  If your skin becomes reddened/irritated stop using the CHG and inform your nurse when you arrive at Short Stay. Do not  shave (including legs and underarms) for at least 48 hours prior to the first CHG shower.  You may shave your face/neck. Please follow these instructions carefully:  1.  Shower with CHG Soap the night before surgery and the  morning of Surgery.  2.  If you choose to wash your hair, wash your hair first as usual with your  normal  shampoo.  3.  After you shampoo, rinse your hair and body thoroughly to remove the  shampoo.                           4.  Use CHG as you would any other liquid soap.  You can apply chg directly  to the skin and wash  Gently with a scrungie or clean washcloth.  5.  Apply the CHG Soap to your body ONLY FROM THE NECK DOWN.   Do not use on face/ open                           Wound or open sores. Avoid contact with eyes, ears mouth and genitals (private parts).                       Wash face,  Genitals (private parts) with your normal soap.             6.  Wash thoroughly, paying special attention to the area where your surgery  will be performed.  7.  Thoroughly rinse your body with warm water from the neck down.  8.  DO NOT shower/wash with your normal soap after using and rinsing off  the CHG Soap.                9.  Pat yourself dry with a clean towel.            10.  Wear clean pajamas.            11.  Place clean sheets on your bed the night of your first shower and do not  sleep with pets. Day of Surgery : Do not apply any lotions/deodorants the morning of surgery.  Please wear clean clothes to the hospital/surgery center.  FAILURE TO FOLLOW THESE INSTRUCTIONS MAY RESULT IN THE CANCELLATION OF YOUR SURGERY PATIENT SIGNATURE_________________________________  NURSE SIGNATURE__________________________________  ________________________________________________________________________   Adam Phenix  An incentive spirometer is a tool that can help keep your lungs clear and active. This tool measures how well you are filling your lungs  with each breath. Taking long deep breaths may help reverse or decrease the chance of developing breathing (pulmonary) problems (especially infection) following:  A long period of time when you are unable to move or be active. BEFORE THE PROCEDURE   If the spirometer includes an indicator to show your best effort, your nurse or respiratory therapist will set it to a desired goal.  If possible, sit up straight or lean slightly forward. Try not to slouch.  Hold the incentive spirometer in an upright position. INSTRUCTIONS FOR USE   Sit on the edge of your bed if possible, or sit up as far as you can in bed or on a chair.  Hold the incentive spirometer in an upright position.  Breathe out normally.  Place the mouthpiece in your mouth and seal your lips tightly around it.  Breathe in slowly and as deeply as possible, raising the piston or the ball toward the top of the column.  Hold your breath for 3-5 seconds or for as long as possible. Allow the piston or ball to fall to the bottom of the column.  Remove the mouthpiece from your mouth and breathe out normally.  Rest for a few seconds and repeat Steps 1 through 7 at least 10 times every 1-2 hours when you are awake. Take your time and take a few normal breaths between deep breaths.  The spirometer may include an indicator to show your best effort. Use the indicator as a goal to work toward during each repetition.  After each set of 10 deep breaths, practice coughing to be sure your lungs are clear. If you have an incision (the cut made at the time of surgery),  support your incision when coughing by placing a pillow or rolled up towels firmly against it. Once you are able to get out of bed, walk around indoors and cough well. You may stop using the incentive spirometer when instructed by your caregiver.  RISKS AND COMPLICATIONS  Take your time so you do not get dizzy or light-headed.  If you are in pain, you may need to take or ask  for pain medication before doing incentive spirometry. It is harder to take a deep breath if you are having pain. AFTER USE  Rest and breathe slowly and easily.  It can be helpful to keep track of a log of your progress. Your caregiver can provide you with a simple table to help with this. If you are using the spirometer at home, follow these instructions: [place] IF:   You are having difficultly using the spirometer.  You have trouble using the spirometer as often as instructed.  Your pain medication is not giving enough relief while using the spirometer.  You develop fever of 100.5 F (38.1 C) or higher. SEEK IMMEDIATE MEDICAL CARE IF:   You cough up bloody sputum that had not been present before.  You develop fever of 102 F (38.9 C) or greater.  You develop worsening pain at or near the incision site. MAKE SURE YOU:   Understand these instructions.  Will watch your condition.  Will get help right away if you are not doing well or get worse. Document Released: 06/15/2006 Document Revised: 04/27/2011 Document Reviewed: 08/16/2006 ExitCare Patient Information 2014 ExitCare, [place].   ________________________________________________________________________  WHAT IS A BLOOD TRANSFUSION? Blood Transfusion Information  A transfusion is the replacement of blood or some of its parts. Blood is made up of multiple cells which provide different functions.  Red blood cells carry oxygen and are used for blood loss replacement.  White blood cells fight against infection.  Platelets control bleeding.  Plasma helps clot blood.  Other blood products are available for specialized needs, such as hemophilia or other clotting disorders. BEFORE THE TRANSFUSION  Who gives blood for transfusions?   Healthy volunteers who are fully evaluated to make sure their blood is safe. This is blood bank blood. Transfusion therapy is the safest it has ever been in the practice of  medicine. Before blood is taken from a donor, a complete history is taken to make sure that person has no history of diseases nor engages in risky social behavior (examples are intravenous drug use or sexual activity with multiple partners). The donor's travel history is screened to minimize risk of transmitting infections, such as malaria. The donated blood is tested for signs of infectious diseases, such as HIV and hepatitis. The blood is then tested to be sure it is compatible with you in order to minimize the chance of a transfusion reaction. If you or a relative donates blood, this is often done in anticipation of surgery and is not appropriate for emergency situations. It takes many days to process the donated blood. RISKS AND COMPLICATIONS Although transfusion therapy is very safe and saves many lives, the main dangers of transfusion include:   Getting an infectious disease.  Developing a transfusion reaction. This is an allergic reaction to something in the blood you were given. Every precaution is taken to prevent this. The decision to have a blood transfusion has been considered carefully by your caregiver before blood is given. Blood is not given unless the benefits outweigh the risks. AFTER THE TRANSFUSION  Right after receiving a blood transfusion, you will usually feel much better and more energetic. This is especially true if your red blood cells have gotten low (anemic). The transfusion raises the level of the red blood cells which carry oxygen, and this usually causes an energy increase.  The nurse administering the transfusion will monitor you carefully for complications. HOME CARE INSTRUCTIONS  No special instructions are needed after a transfusion. You may find your energy is better. Speak with your caregiver about any limitations on activity for underlying diseases you may have. SEEK MEDICAL CARE IF:   Your condition is not improving after your transfusion.  You develop  redness or irritation at the intravenous (IV) site. SEEK IMMEDIATE MEDICAL CARE IF:  Any of the following symptoms occur over the next 12 hours:  Shaking chills.  You have a temperature by mouth above 102 F (38.9 C), not controlled by medicine.  Chest, back, or muscle pain.  People around you feel you are not acting correctly or are confused.  Shortness of breath or difficulty breathing.  Dizziness and fainting.  You get a rash or develop hives.  You have a decrease in urine output.  Your urine turns a dark color or changes to pink, red, or brown. Any of the following symptoms occur over the next 10 days:  You have a temperature by mouth above 102 F (38.9 C), not controlled by medicine.  Shortness of breath.  Weakness after normal activity.  The white part of the eye turns yellow (jaundice).  You have a decrease in the amount of urine or are urinating less often.  Your urine turns a dark color or changes to pink, red, or brown. Document Released: 01/31/2000 Document Revised: 04/27/2011 Document Reviewed: 09/19/2007 ExitCare Patient Information 2014 ExitCare, [place].  _______________________________________________________________________    CLEAR LIQUID DIET   Foods Allowed                                                                     Foods Excluded  Coffee and tea, regular and decaf                             liquids that you cannot  Plain Jell-O in any flavor                                             see through such as: Fruit ices (not with fruit pulp)                                     milk, soups, orange juice  Iced Popsicles                                    All solid food Carbonated beverages, regular and diet  Cranberry, grape and apple juices Sports drinks like Gatorade Lightly seasoned clear broth or consume(fat free) Sugar, honey syrup  Sample Menu Breakfast                                Lunch                                      Supper Cranberry juice                    Beef broth                            Chicken broth Jell-O                                     Grape juice                           Apple juice Coffee or tea                        Jell-O                                      Popsicle                                                Coffee or tea                        Coffee or tea  _____________________________________________________________________

## 2015-08-07 NOTE — Progress Notes (Signed)
Clearance note per chart per Dr Earlean PolkaBissette

## 2015-08-08 NOTE — H&P (Signed)
TOTAL KNEE ADMISSION H&P  Patient is being admitted for left total knee arthroplasty.  Subjective:  Chief Complaint:   Left knee primary OA / pain  HPI: Hailey Shields, 70 y.o. female, has a history of pain and functional disability in the left knee due to arthritis and has failed non-surgical conservative treatments for greater than 12 weeks to include NSAID's and/or analgesics, corticosteriod injections, use of assistive devices and activity modification.  Onset of symptoms was gradual, starting 1+ years ago with gradually worsening course since that time. The patient noted no past surgery on the left knee(s).  Patient currently rates pain in the left knee(s) at 10 out of 10 with activity. Patient has worsening of pain with activity and weight bearing, pain that interferes with activities of daily living, pain with passive range of motion, crepitus and joint swelling.  Patient has evidence of periarticular osteophytes and joint space narrowing by imaging studies. There is no active infection.   Risks, benefits and expectations were discussed with the patient.  Risks including but not limited to the risk of anesthesia, blood clots, nerve damage, blood vessel damage, failure of the prosthesis, infection and up to and including death.  Patient understand the risks, benefits and expectations and wishes to proceed with surgery.   PCP: Olen CordialBISSETTE,STEVEN, MD  D/C Plans:      Home with HHPT  Post-op Meds:       No Rx given  Tranexamic Acid:      To be given - IV   Decadron:      Is to be given  FYI:     ASA  Tramadol & APAP (doesn't tolerate Norco or Oxycodone)    Patient Active Problem List   Diagnosis Date Noted  . Small bowel obstruction (HCC) 11/26/2014  . Incarcerated incisional hernia 11/26/2014   Past Medical History  Diagnosis Date  . Hypertension   . Ovarian cancer (HCC)   . Stroke (HCC)   . Heart murmur   . Wears glasses   . Carpal tunnel syndrome of right wrist     effects thumb  and first two fingers  . History of vertigo   . Fall   . Pneumonia   . History of bronchitis   . History of kidney stones   . Arthritis   . History of chemotherapy     Past Surgical History  Procedure Laterality Date  . Abdominal hysterectomy    . Back surgery    . Ventral hernia repair      with mesh  . Umbilical hernia repair    . Laparotomy N/A 11/27/2014    Procedure: EXPLORATORY LAPAROTOMY;  Surgeon: Darnell Levelodd Gerkin, MD;  Location: [facility];  Service: General;  Laterality: N/A;  . Ventral hernia repair N/A 11/27/2014    Procedure: HERNIA REPAIR VENTRAL ADULT;  Surgeon: Darnell Levelodd Gerkin, MD;  Location: [facility];  Service: General;  Laterality: N/A;  . Insertion of mesh N/A 11/27/2014    Procedure: INSERTION OF MESH;  Surgeon: Darnell Levelodd Gerkin, MD;  Location: [facility];  Service: General;  Laterality: N/A;  . Tonsillectomy    . Cholecystectomy      No prescriptions prior to admission   Allergies  Allergen Reactions  . Prednisone     Increase heart rate  . Zithromax [Azithromycin]     Increase heart rate    Social History  Substance Use Topics  . Smoking status: Never Smoker   . Smokeless tobacco: Never Used  . Alcohol Use: No  Family History  Problem Relation Age of Onset  . Heart attack Brother      Review of Systems  Constitutional: Negative.   HENT: Negative.   Eyes: Negative.   Respiratory: Negative.   Cardiovascular: Negative.   Gastrointestinal: Negative.   Genitourinary: Negative.   Musculoskeletal: Positive for back pain and joint pain.  Skin: Negative.   Neurological: Negative.   Endo/Heme/Allergies: Negative.   Psychiatric/Behavioral: The patient has insomnia.     Objective:  Physical Exam  Constitutional: She is oriented to person, place, and time. She appears well-developed.  HENT:  Head: Normocephalic.  Eyes: Pupils are equal, round, and reactive to light.  Neck: Neck supple. No JVD present. No tracheal deviation present. No thyromegaly present.   Cardiovascular: Normal rate, regular rhythm and intact distal pulses.   Murmur heard. Respiratory: Effort normal and breath sounds normal. No stridor. No respiratory distress. She has no wheezes.  GI: Soft. There is no tenderness. There is no guarding.  Musculoskeletal:       Left knee: She exhibits decreased range of motion, swelling and bony tenderness. She exhibits no ecchymosis, no deformity, no laceration and no erythema. Tenderness found.  Lymphadenopathy:    She has no cervical adenopathy.  Neurological: She is alert and oriented to person, place, and time.  Skin: Skin is warm and dry.  Psychiatric: She has a normal mood and affect.      Labs:  Estimated body mass index is 41.64 kg/(m^2) as calculated from the following:   Height as of 11/27/14: 5\' 3"  (1.6 m).   Weight as of 11/26/14: 106.6 kg (235 lb 0.2 oz).   Imaging Review Plain radiographs demonstrate severe degenerative joint disease of the left knee(s).  The bone quality appears to be good for age and reported activity level.  Assessment/Plan:  End stage arthritis, left knee   The patient history, physical examination, clinical judgment of the provider and imaging studies are consistent with end stage degenerative joint disease of the left knee(s) and total knee arthroplasty is deemed medically necessary. The treatment options including medical management, injection therapy arthroscopy and arthroplasty were discussed at length. The risks and benefits of total knee arthroplasty were presented and reviewed. The risks due to aseptic loosening, infection, stiffness, patella tracking problems, thromboembolic complications and other imponderables were discussed. The patient acknowledged the explanation, agreed to proceed with the plan and consent was signed. Patient is being admitted for inpatient treatment for surgery, pain control, PT, OT, prophylactic antibiotics, VTE prophylaxis, progressive ambulation and ADL's and  discharge planning. The patient is planning to be discharged home with home health services.      Anastasio AuerbachMatthew S. Christinea Brizuela   PA-C  08/08/2015, 9:34 AM

## 2015-08-17 DIAGNOSIS — Z86711 Personal history of pulmonary embolism: Secondary | ICD-10-CM

## 2015-08-17 DIAGNOSIS — Z86718 Personal history of other venous thrombosis and embolism: Secondary | ICD-10-CM

## 2015-08-17 HISTORY — DX: Personal history of pulmonary embolism: Z86.711

## 2015-08-17 HISTORY — DX: Personal history of other venous thrombosis and embolism: Z86.718

## 2015-08-21 NOTE — Progress Notes (Signed)
Pt aware of surgical time change; [facility] 08/27/2015. Pt verbalized understanding of no food after midnight but can have clear liquid diet till 8:15am day of surgery then nothing by mouth.

## 2015-08-27 ENCOUNTER — Inpatient Hospital Stay (HOSPITAL_COMMUNITY): Payer: Medicare Other

## 2015-08-27 ENCOUNTER — Encounter (HOSPITAL_COMMUNITY)
Admission: RE | Disposition: A | Discharge status: Home Health | Payer: Self-pay | Source: Ambulatory Visit | Attending: Orthopedic Surgery

## 2015-08-27 ENCOUNTER — Encounter (HOSPITAL_COMMUNITY): Payer: Self-pay | Admitting: *Deleted

## 2015-08-27 ENCOUNTER — Inpatient Hospital Stay (HOSPITAL_COMMUNITY)
Admission: RE | Admit: 2015-08-27 | Discharge: 2015-08-29 | DRG: 470 | Disposition: A | Discharge status: Home Health | Payer: Medicare Other | Source: Ambulatory Visit | Attending: Orthopedic Surgery | Admitting: Orthopedic Surgery

## 2015-08-27 ENCOUNTER — Inpatient Hospital Stay (HOSPITAL_COMMUNITY): Payer: Medicare Other | Admitting: Anesthesiology

## 2015-08-27 DIAGNOSIS — T404X5A Adverse effect of other synthetic narcotics, initial encounter: Secondary | ICD-10-CM | POA: Diagnosis not present

## 2015-08-27 DIAGNOSIS — Z8249 Family history of ischemic heart disease and other diseases of the circulatory system: Secondary | ICD-10-CM | POA: Diagnosis not present

## 2015-08-27 DIAGNOSIS — I10 Essential (primary) hypertension: Secondary | ICD-10-CM | POA: Diagnosis present

## 2015-08-27 DIAGNOSIS — Z87442 Personal history of urinary calculi: Secondary | ICD-10-CM

## 2015-08-27 DIAGNOSIS — R112 Nausea with vomiting, unspecified: Secondary | ICD-10-CM | POA: Diagnosis not present

## 2015-08-27 DIAGNOSIS — M1712 Unilateral primary osteoarthritis, left knee: Principal | ICD-10-CM | POA: Diagnosis present

## 2015-08-27 DIAGNOSIS — Z79899 Other long term (current) drug therapy: Secondary | ICD-10-CM

## 2015-08-27 DIAGNOSIS — Z8673 Personal history of transient ischemic attack (TIA), and cerebral infarction without residual deficits: Secondary | ICD-10-CM | POA: Diagnosis not present

## 2015-08-27 DIAGNOSIS — R9389 Abnormal findings on diagnostic imaging of other specified body structures: Secondary | ICD-10-CM

## 2015-08-27 DIAGNOSIS — E669 Obesity, unspecified: Secondary | ICD-10-CM | POA: Diagnosis present

## 2015-08-27 DIAGNOSIS — Z6841 Body Mass Index (BMI) 40.0 and over, adult: Secondary | ICD-10-CM

## 2015-08-27 DIAGNOSIS — Z96652 Presence of left artificial knee joint: Secondary | ICD-10-CM

## 2015-08-27 DIAGNOSIS — Z8543 Personal history of malignant neoplasm of ovary: Secondary | ICD-10-CM | POA: Diagnosis not present

## 2015-08-27 DIAGNOSIS — M659 Synovitis and tenosynovitis, unspecified: Secondary | ICD-10-CM | POA: Diagnosis present

## 2015-08-27 DIAGNOSIS — Z9221 Personal history of antineoplastic chemotherapy: Secondary | ICD-10-CM | POA: Diagnosis not present

## 2015-08-27 DIAGNOSIS — Z96659 Presence of unspecified artificial knee joint: Secondary | ICD-10-CM

## 2015-08-27 DIAGNOSIS — M25562 Pain in left knee: Secondary | ICD-10-CM | POA: Diagnosis present

## 2015-08-27 HISTORY — PX: TOTAL KNEE ARTHROPLASTY: SHX125

## 2015-08-27 LAB — ABO/RH: ABO/RH(D): A POS

## 2015-08-27 SURGERY — ARTHROPLASTY, KNEE, TOTAL
Anesthesia: General | Site: Knee | Laterality: Left

## 2015-08-27 MED ORDER — CELECOXIB 200 MG PO CAPS
200.0000 mg | ORAL_CAPSULE | Freq: Two times a day (BID) | ORAL | Status: DC
  Administered 2015-08-28 – 2015-08-29 (×3): 200 mg via ORAL
  Filled 2015-08-27 (×3): qty 1

## 2015-08-27 MED ORDER — BUPIVACAINE-EPINEPHRINE 0.25% -1:200000 IJ SOLN
INTRAMUSCULAR | Status: AC
  Filled 2015-08-27: qty 1

## 2015-08-27 MED ORDER — METHOCARBAMOL 1000 MG/10ML IJ SOLN
500.0000 mg | Freq: Four times a day (QID) | INTRAVENOUS | Status: DC | PRN
  Administered 2015-08-27: 500 mg via INTRAVENOUS
  Filled 2015-08-27: qty 550
  Filled 2015-08-27: qty 5

## 2015-08-27 MED ORDER — SODIUM CHLORIDE 0.9 % IJ SOLN
INTRAMUSCULAR | Status: AC
  Filled 2015-08-27: qty 50

## 2015-08-27 MED ORDER — ACETAMINOPHEN 325 MG PO TABS: 325.0000 mg | ORAL_TABLET | ORAL | Status: DC | PRN

## 2015-08-27 MED ORDER — BISACODYL 10 MG RE SUPP: 10.0000 mg | Freq: Every day | RECTAL | Status: DC | PRN

## 2015-08-27 MED ORDER — LACTATED RINGERS IV SOLN
INTRAVENOUS | Status: DC | PRN
  Administered 2015-08-27 (×2): via INTRAVENOUS

## 2015-08-27 MED ORDER — CEFAZOLIN SODIUM-DEXTROSE 2-4 GM/100ML-% IV SOLN
2.0000 g | INTRAVENOUS | Status: AC
  Administered 2015-08-27: 2 g via INTRAVENOUS
  Filled 2015-08-27: qty 100

## 2015-08-27 MED ORDER — METHOCARBAMOL 500 MG PO TABS
500.0000 mg | ORAL_TABLET | Freq: Four times a day (QID) | ORAL | Status: DC | PRN
  Administered 2015-08-27 – 2015-08-28 (×2): 500 mg via ORAL
  Filled 2015-08-27 (×2): qty 1

## 2015-08-27 MED ORDER — FENTANYL CITRATE (PF) 100 MCG/2ML IJ SOLN
INTRAMUSCULAR | Status: AC
  Filled 2015-08-27: qty 2

## 2015-08-27 MED ORDER — ONDANSETRON HCL 4 MG/2ML IJ SOLN
INTRAMUSCULAR | Status: DC | PRN
  Administered 2015-08-27: 4 mg via INTRAVENOUS

## 2015-08-27 MED ORDER — 0.9 % SODIUM CHLORIDE (POUR BTL) OPTIME
TOPICAL | Status: DC | PRN
  Administered 2015-08-27: 1000 mL

## 2015-08-27 MED ORDER — FERROUS SULFATE 325 (65 FE) MG PO TABS
325.0000 mg | ORAL_TABLET | Freq: Three times a day (TID) | ORAL | Status: DC
  Administered 2015-08-28 – 2015-08-29 (×3): 325 mg via ORAL
  Filled 2015-08-27 (×3): qty 1

## 2015-08-27 MED ORDER — POLYETHYLENE GLYCOL 3350 17 G PO PACK
17.0000 g | PACK | Freq: Two times a day (BID) | ORAL | Status: DC
  Administered 2015-08-28 – 2015-08-29 (×3): 17 g via ORAL
  Filled 2015-08-27 (×3): qty 1

## 2015-08-27 MED ORDER — HYDROMORPHONE HCL 1 MG/ML IJ SOLN
INTRAMUSCULAR | Status: AC
  Filled 2015-08-27: qty 1

## 2015-08-27 MED ORDER — LIDOCAINE HCL (CARDIAC) 20 MG/ML IV SOLN
INTRAVENOUS | Status: DC | PRN
  Administered 2015-08-27: 100 mg via INTRAVENOUS

## 2015-08-27 MED ORDER — PROPOFOL 10 MG/ML IV BOLUS
INTRAVENOUS | Status: AC
  Filled 2015-08-27: qty 40

## 2015-08-27 MED ORDER — MAGNESIUM CITRATE PO SOLN: 1.0000 | Freq: Once | ORAL | Status: DC | PRN

## 2015-08-27 MED ORDER — MIDAZOLAM HCL 5 MG/5ML IJ SOLN
INTRAMUSCULAR | Status: DC | PRN
  Administered 2015-08-27: 2 mg via INTRAVENOUS

## 2015-08-27 MED ORDER — OXYCODONE HCL 5 MG PO TABS: 5.0000 mg | ORAL_TABLET | Freq: Once | ORAL | Status: DC | PRN

## 2015-08-27 MED ORDER — ONDANSETRON HCL 4 MG/2ML IJ SOLN
INTRAMUSCULAR | Status: AC
  Filled 2015-08-27: qty 2

## 2015-08-27 MED ORDER — CHLORHEXIDINE GLUCONATE 4 % EX LIQD: 60.0000 mL | Freq: Once | CUTANEOUS | Status: DC

## 2015-08-27 MED ORDER — CEFAZOLIN SODIUM-DEXTROSE 2-4 GM/100ML-% IV SOLN
2.0000 g | Freq: Four times a day (QID) | INTRAVENOUS | Status: AC
  Administered 2015-08-27 – 2015-08-28 (×2): 2 g via INTRAVENOUS

## 2015-08-27 MED ORDER — CEFAZOLIN SODIUM-DEXTROSE 2-4 GM/100ML-% IV SOLN
INTRAVENOUS | Status: AC
  Filled 2015-08-27: qty 100

## 2015-08-27 MED ORDER — DEXAMETHASONE SODIUM PHOSPHATE 10 MG/ML IJ SOLN: 10.0000 mg | Freq: Once | INTRAMUSCULAR | Status: DC

## 2015-08-27 MED ORDER — KETOROLAC TROMETHAMINE 30 MG/ML IJ SOLN
INTRAMUSCULAR | Status: DC | PRN
  Administered 2015-08-27: 30 mg

## 2015-08-27 MED ORDER — ALUM & MAG HYDROXIDE-SIMETH 200-200-20 MG/5ML PO SUSP: 30.0000 mL | ORAL | Status: DC | PRN

## 2015-08-27 MED ORDER — MENTHOL 3 MG MT LOZG: 1.0000 | LOZENGE | OROMUCOSAL | Status: DC | PRN

## 2015-08-27 MED ORDER — TRANEXAMIC ACID 1000 MG/10ML IV SOLN
1000.0000 mg | Freq: Once | INTRAVENOUS | Status: AC
  Administered 2015-08-27: 1000 mg via INTRAVENOUS
  Filled 2015-08-27: qty 10

## 2015-08-27 MED ORDER — SODIUM CHLORIDE 0.9 % IV SOLN
INTRAVENOUS | Status: DC
  Administered 2015-08-27 – 2015-08-28 (×2): via INTRAVENOUS
  Filled 2015-08-27 (×7): qty 1000

## 2015-08-27 MED ORDER — ONDANSETRON HCL 4 MG/2ML IJ SOLN
4.0000 mg | Freq: Four times a day (QID) | INTRAMUSCULAR | Status: DC | PRN
  Administered 2015-08-28 (×2): 4 mg via INTRAVENOUS
  Filled 2015-08-27 (×2): qty 2

## 2015-08-27 MED ORDER — SODIUM CHLORIDE 0.9 % IJ SOLN
INTRAMUSCULAR | Status: DC | PRN
  Administered 2015-08-27: 30 mL

## 2015-08-27 MED ORDER — ASPIRIN EC 325 MG PO TBEC
325.0000 mg | DELAYED_RELEASE_TABLET | Freq: Two times a day (BID) | ORAL | Status: DC
  Administered 2015-08-28 – 2015-08-29 (×3): 325 mg via ORAL
  Filled 2015-08-27 (×3): qty 1

## 2015-08-27 MED ORDER — TRAMADOL HCL 50 MG PO TABS
50.0000 mg | ORAL_TABLET | Freq: Four times a day (QID) | ORAL | Status: DC
  Administered 2015-08-27: 100 mg via ORAL
  Administered 2015-08-28 (×2): 50 mg via ORAL
  Filled 2015-08-27 (×2): qty 1
  Filled 2015-08-27: qty 2

## 2015-08-27 MED ORDER — SUCCINYLCHOLINE CHLORIDE 20 MG/ML IJ SOLN
INTRAMUSCULAR | Status: DC | PRN
  Administered 2015-08-27: 100 mg via INTRAVENOUS

## 2015-08-27 MED ORDER — DOCUSATE SODIUM 100 MG PO CAPS
100.0000 mg | ORAL_CAPSULE | Freq: Two times a day (BID) | ORAL | Status: DC
  Administered 2015-08-28 – 2015-08-29 (×3): 100 mg via ORAL
  Filled 2015-08-27 (×3): qty 1

## 2015-08-27 MED ORDER — ONDANSETRON HCL 4 MG PO TABS: 4.0000 mg | ORAL_TABLET | Freq: Four times a day (QID) | ORAL | Status: DC | PRN

## 2015-08-27 MED ORDER — LIDOCAINE HCL (CARDIAC) 20 MG/ML IV SOLN
INTRAVENOUS | Status: AC
  Filled 2015-08-27: qty 5

## 2015-08-27 MED ORDER — METOCLOPRAMIDE HCL 5 MG/ML IJ SOLN
5.0000 mg | Freq: Three times a day (TID) | INTRAMUSCULAR | Status: DC | PRN
  Administered 2015-08-27 – 2015-08-28 (×2): 10 mg via INTRAVENOUS
  Filled 2015-08-27 (×2): qty 2

## 2015-08-27 MED ORDER — HYDROMORPHONE HCL 1 MG/ML IJ SOLN
0.5000 mg | INTRAMUSCULAR | Status: DC | PRN
  Administered 2015-08-27: 0.5 mg via INTRAVENOUS
  Administered 2015-08-28: 1 mg via INTRAVENOUS
  Filled 2015-08-27 (×2): qty 1

## 2015-08-27 MED ORDER — MIDAZOLAM HCL 2 MG/2ML IJ SOLN
INTRAMUSCULAR | Status: AC
Start: 2015-08-27 — End: 2015-08-27
  Filled 2015-08-27: qty 2

## 2015-08-27 MED ORDER — DIPHENHYDRAMINE HCL 25 MG PO CAPS: 25.0000 mg | ORAL_CAPSULE | Freq: Four times a day (QID) | ORAL | Status: DC | PRN

## 2015-08-27 MED ORDER — KETOROLAC TROMETHAMINE 30 MG/ML IJ SOLN
INTRAMUSCULAR | Status: AC
  Filled 2015-08-27: qty 1

## 2015-08-27 MED ORDER — ACETAMINOPHEN 160 MG/5ML PO SOLN: 325.0000 mg | ORAL | Status: DC | PRN

## 2015-08-27 MED ORDER — OXYCODONE HCL 5 MG/5ML PO SOLN: 5.0000 mg | Freq: Once | ORAL | Status: DC | PRN

## 2015-08-27 MED ORDER — TRANEXAMIC ACID 1000 MG/10ML IV SOLN
1000.0000 mg | INTRAVENOUS | Status: AC
  Administered 2015-08-27: 1000 mg via INTRAVENOUS
  Filled 2015-08-27: qty 10

## 2015-08-27 MED ORDER — ACETAMINOPHEN 500 MG PO TABS
1000.0000 mg | ORAL_TABLET | Freq: Three times a day (TID) | ORAL | Status: DC
  Administered 2015-08-28 – 2015-08-29 (×4): 1000 mg via ORAL
  Filled 2015-08-27 (×4): qty 2

## 2015-08-27 MED ORDER — METOCLOPRAMIDE HCL 5 MG PO TABS
5.0000 mg | ORAL_TABLET | Freq: Three times a day (TID) | ORAL | Status: DC | PRN
  Administered 2015-08-28: 10 mg via ORAL
  Filled 2015-08-27: qty 2
  Filled 2015-08-27: qty 1

## 2015-08-27 MED ORDER — PHENOL 1.4 % MT LIQD
1.0000 | OROMUCOSAL | Status: DC | PRN
  Filled 2015-08-27: qty 177

## 2015-08-27 MED ORDER — BUPIVACAINE-EPINEPHRINE (PF) 0.25% -1:200000 IJ SOLN
INTRAMUSCULAR | Status: DC | PRN
  Administered 2015-08-27: 30 mL

## 2015-08-27 MED ORDER — HYDROMORPHONE HCL 1 MG/ML IJ SOLN
0.2500 mg | INTRAMUSCULAR | Status: DC | PRN
  Administered 2015-08-27: 0.5 mg via INTRAVENOUS
  Administered 2015-08-27: 0.25 mg via INTRAVENOUS
  Administered 2015-08-27: 0.5 mg via INTRAVENOUS

## 2015-08-27 MED ORDER — PROPOFOL 10 MG/ML IV BOLUS
INTRAVENOUS | Status: DC | PRN
  Administered 2015-08-27: 200 mg via INTRAVENOUS

## 2015-08-27 MED ORDER — FENTANYL CITRATE (PF) 100 MCG/2ML IJ SOLN
INTRAMUSCULAR | Status: DC | PRN
  Administered 2015-08-27 (×3): 100 ug via INTRAVENOUS

## 2015-08-27 SURGICAL SUPPLY — 46 items
BAG DECANTER FOR FLEXI CONT (MISCELLANEOUS) IMPLANT
BAG SPEC THK2 15X12 ZIP CLS (MISCELLANEOUS)
BAG ZIPLOCK 12X15 (MISCELLANEOUS) IMPLANT
BANDAGE ACE 6X5 VEL STRL LF (GAUZE/BANDAGES/DRESSINGS) ×3 IMPLANT
BLADE SAW SGTL 13.0X1.19X90.0M (BLADE) ×3 IMPLANT
BOWL SMART MIX CTS (DISPOSABLE) ×3 IMPLANT
CAPT KNEE TOTAL 3 ATTUNE ×2 IMPLANT
CEMENT HV SMART SET (Cement) ×4 IMPLANT
CLOTH BEACON ORANGE TIMEOUT ST (SAFETY) ×3 IMPLANT
CUFF TOURN SGL QUICK 34 (TOURNIQUET CUFF) ×3
CUFF TRNQT CYL 34X4X40X1 (TOURNIQUET CUFF) ×1 IMPLANT
DECANTER SPIKE VIAL GLASS SM (MISCELLANEOUS) ×3 IMPLANT
DRAPE U-SHAPE 47X51 STRL (DRAPES) ×3 IMPLANT
DRESSING AQUACEL AG SP 3.5X10 (GAUZE/BANDAGES/DRESSINGS) ×1 IMPLANT
DRSG AQUACEL AG ADV 3.5X10 (GAUZE/BANDAGES/DRESSINGS) ×2 IMPLANT
DRSG AQUACEL AG SP 3.5X10 (GAUZE/BANDAGES/DRESSINGS) ×3
DURAPREP 26ML APPLICATOR (WOUND CARE) ×6 IMPLANT
ELECT REM PT RETURN 9FT ADLT (ELECTROSURGICAL) ×3
ELECTRODE REM PT RTRN 9FT ADLT (ELECTROSURGICAL) ×1 IMPLANT
GLOVE BIOGEL M 7.0 STRL (GLOVE) ×2 IMPLANT
GLOVE BIOGEL PI IND STRL 7.5 (GLOVE) ×1 IMPLANT
GLOVE BIOGEL PI IND STRL 8.5 (GLOVE) ×1 IMPLANT
GLOVE BIOGEL PI INDICATOR 7.5 (GLOVE) ×4
GLOVE BIOGEL PI INDICATOR 8.5 (GLOVE)
GLOVE ECLIPSE 8.0 STRL XLNG CF (GLOVE) ×1 IMPLANT
GLOVE ORTHO TXT STRL SZ7.5 (GLOVE) ×4 IMPLANT
GOWN STRL REUS W/TWL LRG LVL3 (GOWN DISPOSABLE) ×3 IMPLANT
GOWN STRL REUS W/TWL XL LVL3 (GOWN DISPOSABLE) ×3 IMPLANT
HANDPIECE INTERPULSE COAX TIP (DISPOSABLE) ×3
LIQUID BAND (GAUZE/BANDAGES/DRESSINGS) ×3 IMPLANT
MANIFOLD NEPTUNE II (INSTRUMENTS) ×3 IMPLANT
PACK TOTAL KNEE CUSTOM (KITS) ×3 IMPLANT
POSITIONER SURGICAL ARM (MISCELLANEOUS) ×3 IMPLANT
SET HNDPC FAN SPRY TIP SCT (DISPOSABLE) ×1 IMPLANT
SET PAD KNEE POSITIONER (MISCELLANEOUS) ×3 IMPLANT
SUT MNCRL AB 4-0 PS2 18 (SUTURE) ×3 IMPLANT
SUT VIC AB 1 CT1 36 (SUTURE) ×3 IMPLANT
SUT VIC AB 2-0 CT1 27 (SUTURE) ×9
SUT VIC AB 2-0 CT1 TAPERPNT 27 (SUTURE) ×3 IMPLANT
SUT VLOC 180 0 24IN GS25 (SUTURE) ×3 IMPLANT
SYR 50ML LL SCALE MARK (SYRINGE) ×3 IMPLANT
TRAY FOLEY W/METER SILVER 14FR (SET/KITS/TRAYS/PACK) ×3 IMPLANT
TRAY FOLEY W/METER SILVER 16FR (SET/KITS/TRAYS/PACK) ×1 IMPLANT
WATER STERILE IRR 1500ML POUR (IV SOLUTION) ×3 IMPLANT
WRAP KNEE MAXI GEL POST OP (GAUZE/BANDAGES/DRESSINGS) ×3 IMPLANT
YANKAUER SUCT BULB TIP 10FT TU (MISCELLANEOUS) ×3 IMPLANT

## 2015-08-27 NOTE — Op Note (Signed)
NAME:  [facility]                      MEDICAL RECORD NO.:  [id number]                             FACILITY:  [facility]      PHYSICIAN:  Hailey Frankel. Charlann Boxer, M.D.  DATE OF BIRTH:  [birth year]      DATE OF PROCEDURE:  08/27/2015                                     OPERATIVE REPORT         PREOPERATIVE DIAGNOSIS:  Left knee osteoarthritis.      POSTOPERATIVE DIAGNOSIS:  Left knee osteoarthritis.      FINDINGS:  The patient was noted to have complete loss of cartilage and   bone-on-bone arthritis with associated osteophytes in the medial and patellofemoral compartments of   the knee with a significant synovitis and associated effusion.      PROCEDURE:  Left total knee replacement.      COMPONENTS USED:  DePuy Attune rotating platform posterior stabilized knee   system, a size 5N femur, 4 tibia, size 6 mm PS AOX insert, and 35 anatomic patellar   button.      SURGEON:  Hailey Frankel. Charlann Boxer, M.D.      ASSISTANT:  Lanney Gins, PA-C.      ANESTHESIA:  General.      SPECIMENS:  None.      COMPLICATION:  None.      DRAINS:  None.  EBL: <100cc      TOURNIQUET TIME:   Total Tourniquet Time Documented: Thigh (Left) - 29 minutes Total: Thigh (Left) - 29 minutes  .      The patient was stable to the recovery room.      INDICATION FOR PROCEDURE:  Hailey Shields is a 70 y.o. female patient of   mine.  The patient had been seen, evaluated, and treated conservatively in the   office with medication, activity modification, and injections.  The patient had   radiographic changes of bone-on-bone arthritis with endplate sclerosis and osteophytes noted.      The patient failed conservative measures including medication, injections, and activity modification, and at this point was ready for more definitive measures.   Based on the radiographic changes and failed conservative measures, the patient   decided to proceed with total knee replacement.  Risks of infection,   DVT, component failure, need  for revision surgery, postop course, and   expectations were all   discussed and reviewed.  Consent was obtained for benefit of pain   relief.      PROCEDURE IN DETAIL:  The patient was brought to the operative theater.   Once adequate anesthesia, preoperative antibiotics, 2 gm of Ancef, 1 gm of Tranexamic Acid administered, the patient was positioned supine with the left thigh tourniquet placed.  The  left lower extremity was prepped and draped in sterile fashion.  A time-   out was performed identifying the patient, planned procedure, and   extremity.      The left lower extremity was placed in the [facility] leg holder.  The leg was   exsanguinated, tourniquet elevated to 250 mmHg.  A midline incision was   made followed by median parapatellar arthrotomy.  Following initial   exposure, attention was first directed to the patella.  Precut   measurement was noted to be 19 mm.  I resected down to 13 mm and used a   35 anatomic patellar button to restore patellar height as well as cover the cut   surface.      The lug holes were drilled and a metal shim was placed to protect the   patella from retractors and saw blades.      At this point, attention was now directed to the femur.  The femoral   canal was opened with a drill, irrigated to try to prevent fat emboli.  An   intramedullary rod was passed at 3 degrees valgus, 9 mm of bone was   resected off the distal femur.  Following this resection, the tibia was   subluxated anteriorly.  Using the extramedullary guide, 2 mm of bone was resected off   the proximal medial tibia.  We confirmed the gap would be   stable medially and laterally with a size 5 mm insert as well as confirmed   the cut was perpendicular in the coronal plane, checking with an alignment rod.      Once this was done, I sized the femur to be a size 5 in the anterior-   posterior dimension, chose a narrow component based on medial and   lateral dimension.  The size 5  rotation block was then pinned in   position anterior referenced using the C-clamp to set rotation.  The   anterior, posterior, and  chamfer cuts were made without difficulty nor   notching making certain that I was along the anterior cortex to help   with flexion gap stability.      The final box cut was made off the lateral aspect of distal femur.      At this point, the tibia was sized to be a size 4, the size 4 tray was   then pinned in position through the medial third of the tubercle,   drilled, and keel punched.  Trial reduction was now carried with a 5 femur,  4 tibia, a size 5 then 6 mm insert, and the 35 aantomic patella botton.  The knee was brought to   extension, full extension with good flexion stability with the patella   tracking through the trochlea without application of pressure.  Given   all these findings the femoral lug holes were drilled and then the trial components removed.  Final components were   opened and cement was mixed.  The knee was irrigated with normal saline   solution and pulse lavage.  The synovial lining was   then injected with 30 cc of 0.25% Marcaine with epinephrine and 1 cc of Toradol plus 30 cc of NS for a   total of 61 cc.      The knee was irrigated.  Final implants were then cemented onto clean and   dried cut surfaces of bone with the knee brought to extension with a size 6 mm trial insert.      Once the cement had fully cured, the excess cement was removed   throughout the knee.  I confirmed I was satisfied with the range of   motion and stability, and the final size 6 mm PS AOX insert was chosen.  It was   placed into the knee.      The tourniquet had been let down at 29 minutes.  No significant  hemostasis required.  The   extensor mechanism was then reapproximated using #1 Vicryl and #0 V-lock sutures with the knee   in flexion.  The   remaining wound was closed with 2-0 Vicryl and running 4-0 Monocryl.   The knee was cleaned,  dried, dressed sterilely using Dermabond and   Aquacel dressing.  The patient was then   brought to recovery room in stable condition, tolerating the procedure   well.   Please note that Physician Assistant, Lanney GinsMatthew Babish, PA-C, was present for the entirety of the case, and was utilized for pre-operative positioning, peri-operative retractor management, general facilitation of the procedure.  He was also utilized for primary wound closure at the end of the case.              Hailey Shields, M.D.    08/27/2015 3:33 PM

## 2015-08-27 NOTE — Interval H&P Note (Signed)
History and Physical Interval Note:  08/27/2015 1:30 PM  Hailey Shields  has presented today for surgery, with the diagnosis of left knee osteoarthritis  The various methods of treatment have been discussed with the patient and family. After consideration of risks, benefits and other options for treatment, the patient has consented to  Procedure(s): LEFT TOTAL KNEE ARTHROPLASTY (Left) as a surgical intervention .  The patient's history has been reviewed, patient examined, no change in status, stable for surgery.  I have reviewed the patient's chart and labs.  Questions were answered to the patient's satisfaction.     Shelda PalLIN,Carry Ortez D

## 2015-08-27 NOTE — Anesthesia Procedure Notes (Signed)
Procedure Name: Intubation Date/Time: 08/27/2015 2:05 PM Performed by: Doran ClayALDAY, Ahsan Esterline R Pre-anesthesia Checklist: Patient identified, Timeout performed, Emergency Drugs available, Suction available and Patient being monitored Patient Re-evaluated:Patient Re-evaluated prior to inductionOxygen Delivery Method: Circle system utilized Preoxygenation: Pre-oxygenation with 100% oxygen Intubation Type: IV induction Laryngoscope Size: Mac and 3 Grade View: Grade II Tube type: Oral Tube size: 7.0 mm Number of attempts: 1 Airway Equipment and Method: Stylet Placement Confirmation: ETT inserted through vocal cords under direct vision,  breath sounds checked- equal and bilateral and positive ETCO2 Secured at: 22 cm Tube secured with: Tape Dental Injury: Teeth and Oropharynx as per pre-operative assessment

## 2015-08-27 NOTE — Anesthesia Preprocedure Evaluation (Addendum)
Anesthesia Evaluation  Patient identified by MRN, date of birth, ID band Patient awake    Reviewed: Allergy & Precautions, NPO status , Patient's Chart, lab work & pertinent test results  History of Anesthesia Complications Negative for: history of anesthetic complications  Airway Mallampati: II  TM Distance: >3 FB Neck ROM: Full    Dental  (+) Teeth Intact   Pulmonary neg shortness of breath, neg sleep apnea, neg COPD, neg recent URI, neg PE   breath sounds clear to auscultation       Cardiovascular hypertension, Pt. on medications (-) angina(-) Past MI and (-) CHF + Valvular Problems/Murmurs  Rhythm:Regular + Systolic murmurs    Neuro/Psych TIA Neuromuscular disease negative psych ROS   GI/Hepatic negative GI ROS, Neg liver ROS,   Endo/Other  Morbid obesity  Renal/GU negative Renal ROS     Musculoskeletal  (+) Arthritis ,   Abdominal   Peds  Hematology   Anesthesia Other Findings   Reproductive/Obstetrics                            Anesthesia Physical Anesthesia Plan  ASA: III  Anesthesia Plan: General and Spinal   Post-op Pain Management:    Induction: Intravenous  Airway Management Planned: Oral ETT  Additional Equipment: None  Intra-op Plan:   Post-operative Plan: Extubation in OR  Informed Consent: I have reviewed the patients History and Physical, chart, labs and discussed the procedure including the risks, benefits and alternatives for the proposed anesthesia with the patient or authorized representative who has indicated his/her understanding and acceptance.   Dental advisory given  Plan Discussed with: CRNA and Surgeon  Anesthesia Plan Comments:         Anesthesia Quick Evaluation

## 2015-08-27 NOTE — Transfer of Care (Signed)
Immediate Anesthesia Transfer of Care Note  Patient: Cammy CopaJudy M Yanni  Procedure(s) Performed: Procedure(s): LEFT TOTAL KNEE ARTHROPLASTY (Left)  Patient Location: PACU  Anesthesia Type:General  Level of Consciousness: sedated  Airway & Oxygen Therapy: Patient Spontanous Breathing and Patient connected to face mask oxygen  Post-op Assessment: Report given to RN and Post -op Vital signs reviewed and stable  Post vital signs: Reviewed and stable  Last Vitals:  Filed Vitals:   08/27/15 1127  BP: 150/78  Pulse: 84  Temp: 37 C  Resp: 16    Last Pain: There were no vitals filed for this visit.    Patients Stated Pain Goal: 4 (08/27/15 1328)  Complications: No apparent anesthesia complications

## 2015-08-27 NOTE — Progress Notes (Signed)
Patient to go from radiology ( pre op CXR) to OR holding.

## 2015-08-28 ENCOUNTER — Encounter (HOSPITAL_COMMUNITY): Payer: Self-pay | Admitting: Orthopedic Surgery

## 2015-08-28 LAB — CBC
HCT: 35.8 % — ABNORMAL LOW (ref 36.0–46.0)
Hemoglobin: 12.2 g/dL (ref 12.0–15.0)
MCH: 31.1 pg (ref 26.0–34.0)
MCHC: 34.1 g/dL (ref 30.0–36.0)
MCV: 91.3 fL (ref 78.0–100.0)
PLATELETS: 244 10*3/uL (ref 150–400)
RBC: 3.92 MIL/uL (ref 3.87–5.11)
RDW: 13.3 % (ref 11.5–15.5)
WBC: 10.2 10*3/uL (ref 4.0–10.5)

## 2015-08-28 LAB — BASIC METABOLIC PANEL
Anion gap: 5 (ref 5–15)
BUN: 11 mg/dL (ref 6–20)
CO2: 26 mmol/L (ref 22–32)
Calcium: 8.4 mg/dL — ABNORMAL LOW (ref 8.9–10.3)
Chloride: 106 mmol/L (ref 101–111)
Creatinine, Ser: 0.5 mg/dL (ref 0.44–1.00)
GFR calc Af Amer: >60 mL/min (ref >60)
GLUCOSE: 130 mg/dL — AB (ref 65–99)
POTASSIUM: 4.1 mmol/L (ref 3.5–5.1)
Sodium: 137 mmol/L (ref 135–145)

## 2015-08-28 LAB — TYPE AND SCREEN
ABO/RH(D): A POS
Antibody Screen: NEGATIVE

## 2015-08-28 NOTE — Progress Notes (Signed)
OT Cancellation Note  Patient Details Name: Hailey Shields MRN: [id number] DOB: 1945-05-20   Cancelled Treatment:    Reason Eval/Treat Not Completed: Other (comment).  Pt with nausea this am. Will check back in the afternoon  [facility] Emergency ServiceENCER,[name] 08/28/2015, 10:19 AM  Marica OtterMaryellen Adyen Bifulco, OTR/L [phone] 08/28/2015

## 2015-08-28 NOTE — Evaluation (Signed)
Physical Therapy Evaluation Patient Details Name: Hailey Shields MRN: [id number] DOB: 1945/08/13 Today's Date: 08/28/2015   History of Present Illness  70 yo female s/p L TKA 08/27/15  Clinical Impression  Bed level eval only. Pt reports nausea/vomiting this am. She still is experiencing nausea at this time. She requested OOB activity be postponed until later today. Pt was agreeable to ROM exercises. Will follow and progress activity as able.     Follow Up Recommendations Home health PT    Equipment Recommendations  None recommended by PT    Recommendations for Other Services       Precautions / Restrictions Precautions Precautions: Fall Restrictions Weight Bearing Restrictions: No LLE Weight Bearing: Weight bearing as tolerated      Mobility  Bed Mobility               General bed mobility comments: NT-pt declined at this time due to nausea/vomiting this am  Transfers                    Ambulation/Gait                Stairs            Wheelchair Mobility    Modified Rankin (Stroke Patients Only)       Balance                                             Pertinent Vitals/Pain Pain Assessment: Faces Faces Pain Scale: Hurts little more Pain Location: L knee Pain Descriptors / Indicators: Sore Pain Intervention(s): Monitored during session;Ice applied;Repositioned    Home Living Family/patient expects to be discharged to:: Private residence Living Arrangements: Spouse/significant other   Type of Home: Mobile home Home Access: Stairs to enter Entrance Stairs-Rails: Right;Left;Can reach both Entrance Stairs-Number of Steps: 5 Home Layout: One level Home Equipment: Walker - 2 wheels;Bedside commode      Prior Function Level of Independence: Independent               Hand Dominance        Extremity/Trunk Assessment   Upper Extremity Assessment: Defer to OT evaluation           Lower Extremity  Assessment: LLE deficits/detail   LLE Deficits / Details: strength at least 3/5 throughout  Cervical / Trunk Assessment: Normal  Communication   Communication: No difficulties  Cognition Arousal/Alertness: Awake/alert Behavior During Therapy: WFL for tasks assessed/performed Overall Cognitive Status: Within Functional Limits for tasks assessed                      General Comments      Exercises Total Joint Exercises Ankle Circles/Pumps: AROM;Both;10 reps;Supine Quad Sets: AROM;Both;10 reps;Supine Heel Slides: AAROM;Left;10 reps;Supine Hip ABduction/ADduction: Left;10 reps;Supine;AROM Straight Leg Raises: AROM;Left;10 reps;Supine Goniometric ROM: ~10-55 degrees      Assessment/Plan    PT Assessment Patient needs continued PT services  PT Diagnosis Difficulty walking;Acute pain   PT Problem List Decreased strength;Decreased range of motion;Decreased activity tolerance;Decreased balance;Decreased mobility;Decreased knowledge of use of DME;Pain  PT Treatment Interventions DME instruction;Gait training;Stair training;Functional mobility training;Therapeutic activities;Patient/family education;Balance training;Therapeutic exercise   PT Goals (Current goals can be found in the Care Plan section) Acute Rehab PT Goals Patient Stated Goal: to feel better.  PT Goal Formulation: With patient Time For Goal Achievement: 09/11/15 Potential  to Achieve Goals: Good    Frequency 7X/week   Barriers to discharge        Co-evaluation               End of Session   Activity Tolerance:  (Limited by nausea so bed level exercises only this am) Patient left: in bed;with call bell/phone within reach;with bed alarm set           Time: 0981-1914 PT Time Calculation (min) (ACUTE ONLY): 12 min   Charges:   PT Evaluation $PT Eval Low Complexity: 1 Procedure     PT G Codes:        Rebeca Alert, MPT Pager: [phone]

## 2015-08-28 NOTE — Care Management Note (Signed)
Case Management Note  Patient Details  Name: Hailey Shields MRN: [id number] Date of Birth: [birth year]  Subjective/Objective:                  PROCEDURE: Left total knee replacement.  Action/Plan: Discharge planning Expected Discharge Date:  08/28/15               Expected Discharge Plan:  [place]  In-House Referral:     Discharge planning Services  CM Consult  Post Acute Care Choice:    Choice offered to:  Patient  DME Arranged:  N/A DME Agency:  NA  HH Arranged:  PT [place] Agency:  [facility] ([facility]  Status of Service:  Completed, signed off  If discussed at [employer] of Stay Meetings, dates discussed:    Additional Comments: Cm met with pt in room to offer choice of home health agency. Pt chooses [facility] to render HHPT.  Referral given to [facility] rep, tim (on unit).  Pt states she has all DME needed at home.  No other CM needs were communicated. Dellie Catholic, RN 08/28/2015, 11:54 AM

## 2015-08-28 NOTE — Progress Notes (Signed)
Physical Therapy Treatment Patient Details Name: Hailey Shields MRN: [id number] DOB: 1945/05/23 Today's Date: 08/28/2015    History of Present Illness 70 yo female s/p L TKA 08/27/15    PT Comments    Pt able to tolerate OOB this session. She is still experiencing nausea. Pt was able to walk ~40 feet with RW. Will continue to follow and progress activity as tolerated.   Follow Up Recommendations  Home health PT     Equipment Recommendations  None recommended by PT    Recommendations for Other Services       Precautions / Restrictions Precautions Precautions: Fall Restrictions Weight Bearing Restrictions: No LLE Weight Bearing: Weight bearing as tolerated    Mobility  Bed Mobility Overal bed mobility: Needs Assistance Bed Mobility: Supine to Sit     Supine to sit: HOB elevated;Min assist     General bed mobility comments: small amount of assist to EOB.   Transfers Overall transfer level: Needs assistance Equipment used: Rolling walker (2 wheeled) Transfers: Sit to/from Stand Sit to Stand: Min assist         General transfer comment: VCs safety, hand placement. Small amount of assist to steady.   Ambulation/Gait Ambulation/Gait assistance: Min assist Ambulation Distance (Feet): 40 Feet Assistive device: Rolling walker (2 wheeled) Gait Pattern/deviations: Step-to pattern;Decreased stance time - left;Antalgic     General Gait Details: small amount of assist to steady intermittently. slow gait speed. Several brief standing rest breaks needed due to nausea.    Stairs            Wheelchair Mobility    Modified Rankin (Stroke Patients Only)       Balance                                    Cognition Arousal/Alertness: Awake/alert Behavior During Therapy: WFL for tasks assessed/performed Overall Cognitive Status: Within Functional Limits for tasks assessed                      Exercises      General Comments         Pertinent Vitals/Pain Pain Assessment: Faces Faces Pain Scale: Hurts little more Pain Location: L knee Pain Descriptors / Indicators: Sore Pain Intervention(s): Limited activity within patient's tolerance;Monitored during session;Repositioned;Ice applied    Home Living                      Prior Function            PT Goals (current goals can now be found in the care plan section) Progress towards PT goals: Progressing toward goals    Frequency  7X/week    PT Plan Current plan remains appropriate    Co-evaluation             End of Session   Activity Tolerance: Patient tolerated treatment well Patient left: in chair;with call bell/phone within reach;with family/visitor present     Time: [id number] PT Time Calculation (min) (ACUTE ONLY): 17 min  Charges:  $Gait Training: 8-22 mins                    G Codes:      Rebeca AlertJannie Jeramine Delis, MPT Pager: [phone]

## 2015-08-28 NOTE — Progress Notes (Signed)
OT Cancellation Note  Patient Details Name: Hailey Shields MRN: [id number] DOB: 1945/06/01   Cancelled Treatment:    Reason Eval/Treat Not Completed: Other (comment)   Pt is back to bed and still feeling nauseous. Will check back tomorrow.  [name] 08/28/2015, 2:51 PM  [name], OTR/L [phone] 08/28/2015

## 2015-08-28 NOTE — Discharge Instructions (Signed)

## 2015-08-28 NOTE — Progress Notes (Signed)
     Subjective: 1 Day Post-Op Procedure(s) (LRB): LEFT TOTAL KNEE ARTHROPLASTY (Left)   Patient reports pain as mild, pain controlled. A few incidents of nausea and vomiting during the night and this AM. We have discussed the possible issues, first being from anesthesia, second possibly from the tramadol, as she has issues with narcotics.  She may try using only APAP, to see if the tramadol is causing her issues.  Will keep her today to see if her nausea will resolve and have her progress with PT.  Objective:   VITALS:   Filed Vitals:   08/28/15 0158 08/28/15 0556  BP: 109/89 118/48  Pulse: 75 72  Temp: 98.2 F (36.8 C) 98.1 F (36.7 C)  Resp: 15 15    Dorsiflexion/Plantar flexion intact Incision: dressing C/D/I No cellulitis present Compartment soft  LABS  Recent Labs  08/28/15 0421  HGB 12.2  HCT 35.8*  WBC 10.2  PLT 244     Recent Labs  08/28/15 0421  NA 137  K 4.1  BUN 11  CREATININE 0.50  GLUCOSE 130*     Assessment/Plan: 1 Day Post-Op Procedure(s) (LRB): LEFT TOTAL KNEE ARTHROPLASTY (Left) Foley cath d/c'ed Advance diet Up with therapy D/C IV fluids Discharge home with home health, eventually when ready  Morbid Obesity (BMI >40)  Estimated body mass index is 40.22 kg/(m^2) as calculated from the following:   Height as of this encounter: 5\' 3"  (1.6 m).   Weight as of this encounter: 102.967 kg (227 lb). Patient also counseled that weight may inhibit the healing process Patient counseled that losing weight will help with future health issues      Anastasio AuerbachMatthew S. Theodosia Bahena   PAC  08/28/2015, 8:09 AM

## 2015-08-29 DIAGNOSIS — E669 Obesity, unspecified: Secondary | ICD-10-CM | POA: Diagnosis present

## 2015-08-29 LAB — BASIC METABOLIC PANEL
ANION GAP: 5 (ref 5–15)
BUN: 8 mg/dL (ref 6–20)
CHLORIDE: 107 mmol/L (ref 101–111)
CO2: 27 mmol/L (ref 22–32)
Calcium: 8.6 mg/dL — ABNORMAL LOW (ref 8.9–10.3)
Creatinine, Ser: 0.45 mg/dL (ref 0.44–1.00)
GFR calc non Af Amer: >60 mL/min (ref >60)
Glucose, Bld: 111 mg/dL — ABNORMAL HIGH (ref 65–99)
POTASSIUM: 3.7 mmol/L (ref 3.5–5.1)
SODIUM: 139 mmol/L (ref 135–145)

## 2015-08-29 LAB — CBC
HEMATOCRIT: 35.2 % — AB (ref 36.0–46.0)
HEMOGLOBIN: 11.8 g/dL — AB (ref 12.0–15.0)
MCH: 30.8 pg (ref 26.0–34.0)
MCHC: 33.5 g/dL (ref 30.0–36.0)
MCV: 91.9 fL (ref 78.0–100.0)
Platelets: 236 10*3/uL (ref 150–400)
RBC: 3.83 MIL/uL — AB (ref 3.87–5.11)
RDW: 13.3 % (ref 11.5–15.5)
WBC: 6.4 10*3/uL (ref 4.0–10.5)

## 2015-08-29 MED ORDER — ASPIRIN 325 MG PO TBEC: 325.0000 mg | DELAYED_RELEASE_TABLET | Freq: Two times a day (BID) | ORAL | Status: AC

## 2015-08-29 MED ORDER — DOCUSATE SODIUM 100 MG PO CAPS
100.0000 mg | ORAL_CAPSULE | Freq: Two times a day (BID) | ORAL | Status: DC
Start: 2015-08-29 — End: 2016-03-20

## 2015-08-29 MED ORDER — ACETAMINOPHEN 500 MG PO TABS: 1000.0000 mg | ORAL_TABLET | Freq: Three times a day (TID) | ORAL | Status: DC

## 2015-08-29 MED ORDER — TIZANIDINE HCL 4 MG PO TABS: 4.0000 mg | ORAL_TABLET | Freq: Four times a day (QID) | ORAL | Status: DC | PRN

## 2015-08-29 MED ORDER — POLYETHYLENE GLYCOL 3350 17 G PO PACK: 17.0000 g | PACK | Freq: Two times a day (BID) | ORAL | Status: DC

## 2015-08-29 MED ORDER — FERROUS SULFATE 325 (65 FE) MG PO TABS
325.0000 mg | ORAL_TABLET | Freq: Three times a day (TID) | ORAL | Status: DC
Start: 2015-08-29 — End: 2016-03-20

## 2015-08-29 MED ORDER — CELECOXIB 200 MG PO CAPS: 200.0000 mg | ORAL_CAPSULE | Freq: Two times a day (BID) | ORAL | Status: DC

## 2015-08-29 NOTE — Progress Notes (Signed)
Occupational Therapy Evaluation Patient Details Name: Hailey Shields MRN: [id number] DOB: 10/25/1945 Today's Date: 08/29/2015    History of Present Illness 70 yo female s/p L TKA 08/27/15   Clinical Impression   All OT education completed and pt questions answered. No further OT needs at this time. Will sign off.    Follow Up Recommendations  No OT follow up;Supervision/Assistance - 24 hour    Equipment Recommendations  None recommended by OT    Recommendations for Other Services       Precautions / Restrictions Precautions Precautions: Fall Restrictions Weight Bearing Restrictions: No LLE Weight Bearing: Weight bearing as tolerated      Mobility Bed Mobility Overal bed mobility: Needs Assistance Bed Mobility: Supine to Sit     Supine to sit: HOB elevated;Supervision        Transfers Overall transfer level: Needs assistance Equipment used: Rolling walker (2 wheeled) Transfers: Sit to/from Stand Sit to Stand: Supervision              Balance                                            ADL Overall ADL's : Needs assistance/impaired Eating/Feeding: Independent;Bed level;Sitting   Grooming: Set up;Sitting           Upper Body Dressing : Set up;Sitting   Lower Body Dressing: Moderate assistance;Sit to/from stand   Toilet Transfer: Supervision/safety;Ambulation;Comfort height toilet;RW   Toileting- Clothing Manipulation and Hygiene: Supervision/safety;Sit to/from stand   Tub/ Shower Transfer: Walk-in shower;Min guard;Cueing for sequencing;Grab bars;Rolling walker   Functional mobility during ADLs: Supervision/safety;Rolling walker General ADL Comments: Patient reports no difficulty with toileting task using comfort height toilet in bathroom and denies need to practice again. She has been up to toilet multiple times with nursing staff. Patient practiced bed mobility, transfers, ambulation to bathroom with RW, shower transfer with and  without grab bar. Educated on 2 different techniques: 1 with grab bar and 1 with RW only. Patient will likely need assistance to get LLE over side of tub as she was barely clearing walk-in shower ledge. Patient reports she may sponge bathe initially. Observed patient not being able to reach feet from seated position and educated on use of reacher to don underwear and/or assistance for LB dressing initially sit to stand. She verbalized understanding. No further OT needs at this time.     Vision     Perception     Praxis      Pertinent Vitals/Pain Pain Assessment: 0-10 Pain Score: 4  Pain Location: L knee Pain Descriptors / Indicators: Aching Pain Intervention(s): Limited activity within patient's tolerance;Monitored during session;Repositioned     Hand Dominance     Extremity/Trunk Assessment Upper Extremity Assessment Upper Extremity Assessment: Overall WFL for tasks assessed   Lower Extremity Assessment Lower Extremity Assessment: Defer to PT evaluation   Cervical / Trunk Assessment Cervical / Trunk Assessment: Normal   Communication Communication Communication: No difficulties   Cognition Arousal/Alertness: Awake/alert Behavior During Therapy: WFL for tasks assessed/performed Overall Cognitive Status: Within Functional Limits for tasks assessed                     General Comments       Exercises       Shoulder Instructions      Home Living Family/patient expects to be discharged to:: Private residence  Living Arrangements: Spouse/significant other Available Help at Discharge: Family;Available 24 hours/day Type of Home: Mobile home Home Access: Stairs to enter Entrance Stairs-Number of Steps: 5 Entrance Stairs-Rails: Right;Left;Can reach both Home Layout: One level     Bathroom Shower/Tub: Chief Strategy OfficerTub/shower unit   Bathroom Toilet: Standard Bathroom Accessibility: Yes How Accessible: Accessible via walker Home Equipment: Walker - 2 wheels;Bedside  commode;Grab bars - tub/shower;Adaptive equipment Adaptive Equipment: Reacher        Prior Functioning/Environment Level of Independence: Independent        Comments: pt's husband has Parkinson's disease and can't drive    OT Diagnosis: Acute pain   OT Problem List: Decreased strength;Decreased range of motion;Decreased knowledge of use of DME or AE;Pain   OT Treatment/Interventions:      OT Goals(Current goals can be found in the care plan section) Acute Rehab OT Goals Patient Stated Goal: home today OT Goal Formulation: All assessment and education complete, DC therapy  OT Frequency:     Barriers to D/C:            Co-evaluation              End of Session Equipment Utilized During Treatment: Rolling walker  Activity Tolerance: Patient tolerated treatment well Patient left: in chair;with call bell/phone within reach   Time: 0916-0933 OT Time Calculation (min): 17 min Charges:  OT General Charges $OT Visit: 1 Procedure OT Evaluation $OT Eval Low Complexity: 1 Procedure G-Codes:    Joanthony Hamza A 08/29/2015, 10:06 AM

## 2015-08-29 NOTE — Progress Notes (Signed)
Physical Therapy Treatment Patient Details Name: Hailey CopaJudy M Shields MRN: [id number] DOB: Aug 12, 1945 Today's Date: 08/29/2015    History of Present Illness 70 yo female s/p L TKA 08/27/15    PT Comments    Pt is feeling much better today. She tolerated session well. Will have a 2nd session prior to d/c later today.   Follow Up Recommendations  Home health PT     Equipment Recommendations  None recommended by PT    Recommendations for Other Services       Precautions / Restrictions Precautions Precautions: Fall Restrictions Weight Bearing Restrictions: No LLE Weight Bearing: Weight bearing as tolerated    Mobility  Bed Mobility          General bed mobility comments: oob in recliner  Transfers Overall transfer level: Needs assistance Equipment used: Rolling walker (2 wheeled) Transfers: Sit to/from Stand Sit to Stand: Supervision         General transfer comment: for safety.   Ambulation/Gait Ambulation/Gait assistance: Min guard Ambulation Distance (Feet): 70 Feet Assistive device: Rolling walker (2 wheeled) Gait Pattern/deviations: Step-to pattern;Antalgic     General Gait Details: close guard for safety.    Stairs Stairs: Yes Stairs assistance: Min guard Stair Management: Two rails;Step to pattern;Forwards Number of Stairs: 2 General stair comments: close guard for safety. VCs safety, sequence.   Wheelchair Mobility    Modified Rankin (Stroke Patients Only)       Balance                                    Cognition Arousal/Alertness: Awake/alert Behavior During Therapy: WFL for tasks assessed/performed Overall Cognitive Status: Within Functional Limits for tasks assessed                      Exercises Total Joint Exercises Ankle Circles/Pumps: AROM;Both;10 reps;Supine Quad Sets: AROM;Both;10 reps;Supine Heel Slides: AAROM;Left;10 reps;Supine Hip ABduction/ADduction: Left;10 reps;Supine;AROM Straight Leg Raises:  AROM;Left;10 reps;Supine Knee Flexion: AAROM;Left;10 reps;Seated Goniometric ROM: ~5-75 degrees    General Comments        Pertinent Vitals/Pain Pain Assessment: 0-10 Pain Score: 4  Pain Location: L knee Pain Descriptors / Indicators: Aching;Sore Pain Intervention(s): Monitored during session;Ice applied;Repositioned    Home Living Family/patient expects to be discharged to:: Private residence Living Arrangements: Spouse/significant other Available Help at Discharge: Family;Available 24 hours/day Type of Home: Mobile home Home Access: Stairs to enter Entrance Stairs-Rails: Right;Left;Can reach both Home Layout: One level Home Equipment: Walker - 2 wheels;Bedside commode;Grab bars - tub/shower;Adaptive equipment      Prior Function Level of Independence: Independent      Comments: pt's husband has Parkinson's disease and can't drive   PT Goals (current goals can now be found in the care plan section) Acute Rehab PT Goals Patient Stated Goal: home today Progress towards PT goals: Progressing toward goals    Frequency  7X/week    PT Plan Current plan remains appropriate    Co-evaluation             End of Session   Activity Tolerance: Patient tolerated treatment well Patient left: in chair;with call bell/phone within reach     Time: 4098-11910958-1012 PT Time Calculation (min) (ACUTE ONLY): 14 min  Charges:  $Gait Training: 8-22 mins                    G Codes:  Weston Anna, MPT Pager: [phone]

## 2015-08-29 NOTE — Progress Notes (Signed)
     Subjective: 2 Days Post-Op Procedure(s) (LRB): LEFT TOTAL KNEE ARTHROPLASTY (Left)   Patient reports pain as mild, pain controlled. No events throughout the night.  States that her nausea has resolved once she stopped using the tramadol. Ready to be discharged home.  Objective:   VITALS:   Filed Vitals:   08/28/15 1856 08/28/15 2200  BP: 137/56 135/75  Pulse: 84 65  Temp: 98.2 F (36.8 C) 97.8 F (36.6 C)  Resp: 15 16    Dorsiflexion/Plantar flexion intact Incision: dressing C/D/I No cellulitis present Compartment soft  LABS  Recent Labs  08/28/15 0421 08/29/15 0403  HGB 12.2 11.8*  HCT 35.8* 35.2*  WBC 10.2 6.4  PLT 244 236     Recent Labs  08/28/15 0421 08/29/15 0403  NA 137 139  K 4.1 3.7  BUN 11 8  CREATININE 0.50 0.45  GLUCOSE 130* 111*     Assessment/Plan: 2 Days Post-Op Procedure(s) (LRB): LEFT TOTAL KNEE ARTHROPLASTY (Left) Up with therapy Discharge home with home health  [facility]. Follow up with OLIN,Hamp Moreland D in 2 weeks.  Contact information:  [facility] [address], [address] [place] [id number] [phone]    Morbid Obesity (BMI >40)  Estimated body mass index is 40.22 kg/(m^2) as calculated from the following:   Height as of this encounter: 5\' 3"  (1.6 m).   Weight as of this encounter: 102.967 kg (227 lb). Patient also counseled that weight may inhibit the healing process Patient counseled that losing weight will help with future health issues         Anastasio AuerbachMatthew S. Hulda Reddix   PAC  08/29/2015, 7:04 AM

## 2015-08-29 NOTE — Progress Notes (Signed)
Physical Therapy Treatment Patient Details Name: Hailey Shields MRN: [id number] DOB: 1946/02/12 Today's Date: 08/29/2015    History of Present Illness 70 yo female s/p L TKA 08/27/15    PT Comments    All education completed. Ready to d/c from PT standpoint.   Follow Up Recommendations  Home health PT     Equipment Recommendations  None recommended by PT    Recommendations for Other Services       Precautions / Restrictions Precautions Precautions: Fall Restrictions Weight Bearing Restrictions: No LLE Weight Bearing: Weight bearing as tolerated    Mobility  Bed Mobility Overal bed mobility: Needs Assistance Bed Mobility: Supine to Sit     Supine to sit: Min guard     General bed mobility comments: close guard for safety  Transfers Overall transfer level: Needs assistance Equipment used: Rolling walker (2 wheeled) Transfers: Sit to/from Stand Sit to Stand: Supervision         General transfer comment: for safety, VCs hand placement  Ambulation/Gait Ambulation/Gait assistance: Min guard Ambulation Distance (Feet): 70 Feet Assistive device: Rolling walker (2 wheeled) Gait Pattern/deviations: Step-to pattern     General Gait Details: close guard for safety.    Stairs .   Wheelchair Mobility    Modified Rankin (Stroke Patients Only)       Balance                                    Cognition Arousal/Alertness: Awake/alert Behavior During Therapy: WFL for tasks assessed/performed Overall Cognitive Status: Within Functional Limits for tasks assessed                      Exercises     General Comments        Pertinent Vitals/Pain Pain Assessment: 0-10 Pain Score: 4  Pain Location: L knee Pain Descriptors / Indicators: Aching;Sore Pain Intervention(s): Monitored during session    Home Living                      Prior Function            PT Goals (current goals can now be found in the care plan  section) Progress towards PT goals: Progressing toward goals    Frequency  7X/week    PT Plan Current plan remains appropriate    Co-evaluation             End of Session   Activity Tolerance: Patient tolerated treatment well Patient left: in bed;with call bell/phone within reach     Time: 1255-1303 PT Time Calculation (min) (ACUTE ONLY): 8 min  Charges:  $Gait Training: 8-22 mins                    G Codes:      Rebeca AlertJannie Johndavid Geralds, MPT Pager: [phone]

## 2015-09-02 NOTE — Discharge Summary (Signed)
Physician Discharge Summary  Patient ID: Hailey Shields MRN: [id number] DOB/AGE: 03/11/1945 70 y.o.  Admit date: 08/27/2015 Discharge date: 08/29/2015   Procedures:  Procedure(s) (LRB): LEFT TOTAL KNEE ARTHROPLASTY (Left)  Attending Physician:  Dr. Durene RomansMatthew Olin   Admission Diagnoses:   Left knee primary OA / pain  Discharge Diagnoses:  Principal Problem:   S/P left TKA Active Problems:   Morbid obesity (HCC)  Past Medical History  Diagnosis Date  . Hypertension   . Ovarian cancer (HCC)   . Stroke (HCC)   . Heart murmur   . Wears glasses   . Carpal tunnel syndrome of right wrist     effects thumb and first two fingers  . History of vertigo   . Fall   . Pneumonia   . History of bronchitis   . History of kidney stones   . Arthritis   . History of chemotherapy     HPI:    Hailey Shields, 70 y.o. female, has a history of pain and functional disability in the left knee due to arthritis and has failed non-surgical conservative treatments for greater than 12 weeks to include NSAID's and/or analgesics, corticosteriod injections, use of assistive devices and activity modification. Onset of symptoms was gradual, starting 1+ years ago with gradually worsening course since that time. The patient noted no past surgery on the left knee(s). Patient currently rates pain in the left knee(s) at 10 out of 10 with activity. Patient has worsening of pain with activity and weight bearing, pain that interferes with activities of daily living, pain with passive range of motion, crepitus and joint swelling. Patient has evidence of periarticular osteophytes and joint space narrowing by imaging studies. There is no active infection. Risks, benefits and expectations were discussed with the patient. Risks including but not limited to the risk of anesthesia, blood clots, nerve damage, blood vessel damage, failure of the prosthesis, infection and up to and including death. Patient understand the risks,  benefits and expectations and wishes to proceed with surgery.   PCP: Olen CordialBISSETTE,STEVEN, MD   Discharged Condition: good  Hospital Course:  Patient underwent the above stated procedure on 08/27/2015. Patient tolerated the procedure well and brought to the recovery room in good condition and subsequently to the floor.  POD #1 BP: 118/48 ; Pulse: 72 ; Temp: 98.1 F (36.7 C) ; Resp: 15 Patient reports pain as mild, pain controlled. A few incidents of nausea and vomiting during the night and this AM. We have discussed the possible issues, first being from anesthesia, second possibly from the tramadol, as she has issues with narcotics. She may try using only APAP, to see if the tramadol is causing her issues. Will keep her today to see if her nausea will resolve and have her progress with PT. Dorsiflexion/plantar flexion intact, incision: dressing C/D/I, no cellulitis present and compartment soft.   LABS  Basename    HGB     12.2  HCT     35.8   POD #2  BP: 135/75 ; Pulse: 65 ; Temp: 97.8 F (36.6 C) ; Resp: 16 Patient reports pain as mild, pain controlled. No events throughout the night. States that her nausea has resolved once she stopped using the tramadol. Ready to be discharged home. Dorsiflexion/plantar flexion intact, incision: dressing C/D/I, no cellulitis present and compartment soft.   LABS  Basename    HGB     11.8  HCT     35.2    Discharge Exam: General  appearance: alert, cooperative and no distress Extremities: Homans sign is negative, no sign of DVT, no edema, redness or tenderness in the calves or thighs and no ulcers, gangrene or trophic changes  Disposition: Home with follow up in 2 weeks   Follow-up Information    Follow up with Shelda Pal, MD. Schedule an appointment as soon as possible for a visit in 2 weeks.   Specialty:  Orthopedic Surgery   Contact information:   [address] [place] [address] [phone]       Follow up with  [facility].   Why:  home health physical therapy   Contact information:   [address] [place] [address] [phone]       Discharge Instructions    Call MD / Call 911    Complete by:  As directed   If you experience chest pain or shortness of breath, CALL 911 and be transported to the hospital emergency room.  If you develope a fever above 101 F, pus (white drainage) or increased drainage or redness at the wound, or calf pain, call your surgeon's office.     Change dressing    Complete by:  As directed   Maintain surgical dressing until follow up in the clinic. If the edges start to pull up, may reinforce with tape. If the dressing is no longer working, may remove and cover with gauze and tape, but must keep the area dry and clean.  Call with any questions or concerns.     Constipation Prevention    Complete by:  As directed   Drink plenty of fluids.  Prune juice may be helpful.  You may use a stool softener, such as Colace (over the counter) 100 mg twice a day.  Use MiraLax (over the counter) for constipation as needed.     Diet - low sodium heart healthy    Complete by:  As directed      Discharge instructions    Complete by:  As directed   Maintain surgical dressing until follow up in the clinic. If the edges start to pull up, may reinforce with tape. If the dressing is no longer working, may remove and cover with gauze and tape, but must keep the area dry and clean.  Follow up in 2 weeks at [facility]. Call with any questions or concerns.     Increase activity slowly as tolerated    Complete by:  As directed   Weight bearing as tolerated with assist device (walker, cane, etc) as directed, use it as long as suggested by your surgeon or therapist, typically at least 4-6 weeks.     TED hose    Complete by:  As directed   Use stockings (TED hose) for 2 weeks on both leg(s).  You may remove them at night for sleeping.             Medication  List    STOP taking these medications        aspirin 81 MG tablet  Replaced by:  aspirin 325 MG EC tablet     glucosamine-chondroitin 500-400 MG tablet     HYDROcodone-acetaminophen 5-325 MG tablet  Commonly known as:  NORCO/VICODIN     meloxicam 7.5 MG tablet  Commonly known as:  MOBIC      TAKE these medications        acetaminophen 500 MG tablet  Commonly known as:  TYLENOL  Take 2 tablets (1,000 mg  total) by mouth every 8 (eight) hours.     ALLER-CHLOR 4 MG tablet  Generic drug:  chlorpheniramine  Take 4 mg by mouth 2 (two) times daily as needed for allergies.     aspirin 325 MG EC tablet  Take 1 tablet (325 mg total) by mouth 2 (two) times daily.     celecoxib 200 MG capsule  Commonly known as:  CELEBREX  Take 1 capsule (200 mg total) by mouth every 12 (twelve) hours.     docusate sodium 100 MG capsule  Commonly known as:  COLACE  Take 1 capsule (100 mg total) by mouth 2 (two) times daily.     ferrous sulfate 325 (65 FE) MG tablet  Take 1 tablet (325 mg total) by mouth 3 (three) times daily after meals.     lisinopril-hydrochlorothiazide 20-12.5 MG tablet  Commonly known as:  PRINZIDE,ZESTORETIC  Take 1 tablet by mouth daily.     multivitamin tablet  Take 1 tablet by mouth daily.     polyethylene glycol packet  Commonly known as:  MIRALAX / GLYCOLAX  Take 17 g by mouth 2 (two) times daily.     sodium chloride 0.65 % Soln nasal spray  Commonly known as:  OCEAN  Place 2 sprays into both nostrils daily as needed for congestion.     tiZANidine 4 MG tablet  Commonly known as:  ZANAFLEX  Take 1 tablet (4 mg total) by mouth every 6 (six) hours as needed for muscle spasms.         Signed: Anastasio Auerbach. Eshika Reckart   PA-C  09/02/2015, 9:27 PM

## 2015-09-02 NOTE — Anesthesia Postprocedure Evaluation (Signed)
Anesthesia Post Note  Patient: Cammy CopaJudy M Ekman  Procedure(s) Performed: Procedure(s) (LRB): LEFT TOTAL KNEE ARTHROPLASTY (Left)  Patient location during evaluation: PACU Anesthesia Type: General Level of consciousness: awake Pain management: pain level controlled Vital Signs Assessment: post-procedure vital signs reviewed and stable Respiratory status: spontaneous breathing Cardiovascular status: stable Postop Assessment: no signs of nausea or vomiting Anesthetic complications: no    Last Vitals:  Filed Vitals:   08/28/15 2200 08/29/15 0717  BP: 135/75 140/66  Pulse: 65 76  Temp: 36.6 C 36.9 C  Resp: 16 16    Last Pain:  Filed Vitals:   08/29/15 1436  PainSc: 2                  Dilyn Smiles

## 2016-03-20 ENCOUNTER — Telehealth (HOSPITAL_COMMUNITY): Payer: Self-pay

## 2016-03-20 ENCOUNTER — Encounter: Payer: Self-pay | Admitting: Cardiovascular Disease

## 2016-03-20 ENCOUNTER — Ambulatory Visit (INDEPENDENT_AMBULATORY_CARE_PROVIDER_SITE_OTHER): Payer: Medicare Other | Admitting: Cardiovascular Disease

## 2016-03-20 VITALS — BP 128/70 | HR 68 | Ht 62.0 in | Wt 221.2 lb

## 2016-03-20 DIAGNOSIS — I2510 Atherosclerotic heart disease of native coronary artery without angina pectoris: Secondary | ICD-10-CM

## 2016-03-20 DIAGNOSIS — R0602 Shortness of breath: Secondary | ICD-10-CM

## 2016-03-20 DIAGNOSIS — E782 Mixed hyperlipidemia: Secondary | ICD-10-CM

## 2016-03-20 DIAGNOSIS — I350 Nonrheumatic aortic (valve) stenosis: Secondary | ICD-10-CM | POA: Diagnosis not present

## 2016-03-20 DIAGNOSIS — E785 Hyperlipidemia, unspecified: Secondary | ICD-10-CM | POA: Insufficient documentation

## 2016-03-20 DIAGNOSIS — I10 Essential (primary) hypertension: Secondary | ICD-10-CM

## 2016-03-20 DIAGNOSIS — Z01810 Encounter for preprocedural cardiovascular examination: Secondary | ICD-10-CM | POA: Diagnosis not present

## 2016-03-20 DIAGNOSIS — Z86711 Personal history of pulmonary embolism: Secondary | ICD-10-CM

## 2016-03-20 NOTE — Patient Instructions (Signed)
Your physician has requested that you have an echocardiogram. Echocardiography is a painless test that uses sound waves to create images of your heart. It provides your doctor with information about the size and shape of your heart and how well your heart's chambers and valves are working. This procedure takes approximately one hour. There are no restrictions for this procedure. This will be performed at our [address] location - [address], [address].  Your physician has requested that you have an exercise tolerance test. For further information please visit [email or web address]. Please also follow instruction sheet, as given.  Dr Croitoru recommends that you follow-up with him as needed. 

## 2016-03-20 NOTE — Progress Notes (Signed)
Cardiology Consultation Note    Date:  03/20/2016   ID:  Hailey CopaJudy M Maslin, [place] MeoDOB 10-11-1945, MRN [id number]  PCP:  Olen CordialBISSETTE,STEVEN, MD  Cardiologist:   Thurmon FairMihai Froilan Mclean, MD   Chief Complaint  Patient presents with  . New Patient (Initial Visit)    patient had DVT after knee surgery. former cardiology patient of Dr Alanda AmassWeintraub. last seen approx. 2004.  Evaluation prior to planned right total knee replacement/cardiovascular risk evaluation  History of Present Illness:  Hailey Shields is a 71 y.o. female here for her first Cardiology evaluation in over 10 years. Her brother Dalene SeltzerJames Marley is also my patient. He underwent bypass surgery recently.  In July 2017 she underwent left knee replacement surgery. She recalls receiving aspirin for prophylaxis after surgery and she had a left knee hematoma. Roughly 2 weeks after surgery she developed a submassive pulmonary embolism. Echocardiography showed mildly depressed right ventricular function and she had a mild increase in cardiac troponin of 0.39. Thrombolytic therapy was considered, but she eventually received conservative therapy and improved. She is no longer receiving anticoagulation. Left ventricular systolic function was normal and she was described as having mild aortic stenosis. Axis mean gradient 12 mmHg). Her chest CT did show evidence of left ventricular hypertrophy and three-vessel coronary atherosclerosis.  She denies current serious problems with dyspnea on exertion or angina pectoris: she does get a little short of breath when she climbs a [place] but her knee pain limits her more than her shortness of breath. Denies palpitations, syncope, dizziness, claudication or lower extremity edema. Several times a day she'll get a sensation of "gas" in her chest. This is never associated with physical activity and occurs at rest and resolved spontaneously. She performs leg lifts and walks a quarter of a mile every day, these never provoked chest pain. She also  reports a lot of belching.  She believes she will probably have to undergo right total knee replacement within the next year (seeing Dr. Charlann Boxerlin). She has lost 40 pounds in the last 4 years by paying more attention to her diet and is very proud of this accomplishment. Unfortunately, she is still morbidly obese with a BMI just over 40.  She has chronically elevated cholesterol. She has tried both atorvastatin or rosuvastatin which both made her joints ache. When she stopped these medications she felt better within a short period of time.  Her brother Rosanne AshingJim had bypass surgery not long ago. Both her parents lived into her late 6280s and had congestive heart failures.  Has had numerous previous surgeries including tonsillectomy, umbilical hernia, cholecystectomy, total hysterectomy for ovarian cancer in 2003, R inguinal hernia 2008 and 2016, lumbar spine fusion 2009. She had total knee replacement last year which is when she had a pulmonary embolism.  Past Medical History:  Diagnosis Date  . Arthritis   . Carpal tunnel syndrome of right wrist    effects thumb and first two fingers  . Fall   . Heart murmur   . History of bronchitis   . History of chemotherapy   . History of kidney stones   . History of vertigo   . Hypertension   . Ovarian cancer (HCC)   . Pneumonia   . Stroke (HCC)   . Wears glasses     Past Surgical History:  Procedure Laterality Date  . ABDOMINAL HYSTERECTOMY    . BACK SURGERY    . CHOLECYSTECTOMY    . INSERTION OF MESH N/A 11/27/2014   Procedure: INSERTION OF  MESH;  Surgeon: Darnell Level, MD;  Location: [facility];  Service: General;  Laterality: N/A;  . LAPAROTOMY N/A 11/27/2014   Procedure: EXPLORATORY LAPAROTOMY;  Surgeon: Darnell Level, MD;  Location: [facility];  Service: General;  Laterality: N/A;  . TONSILLECTOMY    . TOTAL KNEE ARTHROPLASTY Left 08/27/2015   Procedure: LEFT TOTAL KNEE ARTHROPLASTY;  Surgeon: Durene Romans, MD;  Location: [facility];  Service: Orthopedics;   Laterality: Left;  . UMBILICAL HERNIA REPAIR    . VENTRAL HERNIA REPAIR     with mesh  . VENTRAL HERNIA REPAIR N/A 11/27/2014   Procedure: HERNIA REPAIR VENTRAL ADULT;  Surgeon: Darnell Level, MD;  Location: [facility];  Service: General;  Laterality: N/A;    Current Medications: Outpatient Medications Prior to Visit  Medication Sig Dispense Refill  . chlorpheniramine (ALLER-CHLOR) 4 MG tablet Take 4 mg by mouth 2 (two) times daily as needed for allergies.    Marland Kitchen lisinopril-hydrochlorothiazide (PRINZIDE,ZESTORETIC) 20-12.5 MG per tablet Take 1 tablet by mouth daily.    . Multiple Vitamin (MULTIVITAMIN) tablet Take 1 tablet by mouth daily.    . sodium chloride (OCEAN) 0.65 % SOLN nasal spray Place 2 sprays into both nostrils daily as needed for congestion.    Marland Kitchen acetaminophen (TYLENOL) 500 MG tablet Take 2 tablets (1,000 mg total) by mouth every 8 (eight) hours. 30 tablet 0  . celecoxib (CELEBREX) 200 MG capsule Take 1 capsule (200 mg total) by mouth every 12 (twelve) hours. 60 capsule 0  . docusate sodium (COLACE) 100 MG capsule Take 1 capsule (100 mg total) by mouth 2 (two) times daily. 10 capsule 0  . ferrous sulfate 325 (65 FE) MG tablet Take 1 tablet (325 mg total) by mouth 3 (three) times daily after meals.  3  . polyethylene glycol (MIRALAX / GLYCOLAX) packet Take 17 g by mouth 2 (two) times daily. 14 each 0  . tiZANidine (ZANAFLEX) 4 MG tablet Take 1 tablet (4 mg total) by mouth every 6 (six) hours as needed for muscle spasms. 40 tablet 0   No facility-administered medications prior to visit.      Allergies:   Codeine; Prednisone; Tramadol; and Zithromax [azithromycin]   Social History   Social History  . Marital status: Married    Spouse name: N/A  . Number of children: N/A  . Years of education: N/A   Social History Main Topics  . Smoking status: Never Smoker  . Smokeless tobacco: Never Used  . Alcohol use No  . Drug use: No  . Sexual activity: Not Asked   Other Topics  Concern  . None   Social History Narrative  . None     Family History:  The patient's family history includes Heart attack in her brother.   ROS:   Please see the history of present illness.    ROS All other systems reviewed and are negative.   PHYSICAL EXAM:   VS:  BP 128/70   Pulse 68   Ht 5\' 2"  (1.575 m)   Wt 100.3 kg (221 lb 3.2 oz)   BMI 40.46 kg/m    GEN: Well nourished, well developed, in no acute distress  HEENT: normal  Neck: no JVD, carotid bruits, or masses Cardiac: RRR; 2/6 early peaking systolic ejection murmur that radiates towards the apex, no diastolic murmurs, rubs, or gallops,no edema  Respiratory:  clear to auscultation bilaterally, normal work of breathing GI: soft, nontender, nondistended, + BS MS: no deformity or atrophy  Skin: warm and  dry, no rash Neuro:  Alert and Oriented x 3, Strength and sensation are intact Psych: euthymic mood, full affect  Wt Readings from Last 3 Encounters:  03/20/16 100.3 kg (221 lb 3.2 oz)  08/27/15 103 kg (227 lb)  08/07/15 103 kg (227 lb 2 oz)      Studies/Labs Reviewed:   EKG:  EKG is ordered today.  The ekg ordered today demonstrates Normal sinus rhythm, left axis deviation, pulmonary disease pattern, QTC 412 ms  Recent Labs: 08/29/2015: BUN 8; Creatinine, Ser 0.45; Hemoglobin 11.8; Platelets 236; Potassium 3.7; Sodium 139   Lipid Panel September 2016 total cholesterol 315, triglycerides 209, LDL 223, HDL 50  Additional studies/ records that were reviewed today include:  Records from hospitalization 2017 including echo, CT, ECG, peripheral Doppler   ASSESSMENT:    1. History of pulmonary embolism   2. Nonrheumatic aortic valve stenosis   3. Mixed hyperlipidemia   4. Morbid obesity (HCC)   5. Essential hypertension   6. Coronary artery calcification seen on CT scan   7. Preoperative cardiovascular examination   8. Shortness of breath      PLAN:  In order of problems listed above:  1. Hx PE:  Following total knee replacement, currently no longer on anticoagulants. She is worried that her heart is still damaged from this event. We'll repeat an echocardiogram, but suspect we will see complete recovery of the right ventricular function. 2. AS: Mild by echo last year. Will reevaluate by echo. 3. HLP: Severe hyperlipidemia with a family history of coronary artery disease and personal history of hypertension. Recommend aggressive treatment to target LDL at least less than 100. Need to identify a lipid-lowering regimen that she will tolerate. 4. HTN: well treated. 5. Obesity: Congratulated on weight loss to date, encouraged to keep on trying to lose weight. 6. Coronary calcification on CT: Her chest symptoms are not consistent with angina pectoris. She has good functional status. The focus should be on risk factor modification, especially lowering her severe hyperlipidemia. 7. Preop risk evaluation: He has good functional status despite her orthopedic problems and weight. I think she can undergo contralateral knee replacement with relatively low risk of complications as long as we provide very aggressive prophylaxis of DVT/PE including the use of direct oral anticoagulants immediately after surgery.    Medication Adjustments/Labs and Tests Ordered: Current medicines are reviewed at length with the patient today.  Concerns regarding medicines are outlined above.  Medication changes, Labs and Tests ordered today are listed in the Patient Instructions below. Patient Instructions  Your physician has requested that you have an echocardiogram. Echocardiography is a painless test that uses sound waves to create images of your heart. It provides your doctor with information about the size and shape of your heart and how well your heart's chambers and valves are working. This procedure takes approximately one hour. There are no restrictions for this procedure. This will be performed at our [facility]  location - [address], [address].  Your physician has requested that you have an exercise tolerance test. For further information please visit [email or web address] Please also follow instruction sheet, as given.  Dr Royann Shivers recommends that you follow-up with him as needed.    Signed, Thurmon Fair, MD  03/20/2016 8:19 PM    [facility] [address] [place], [place], [place]  [address] Phone: [phone]; Fax: [phone]

## 2016-03-20 NOTE — Telephone Encounter (Signed)
Encounter complete. 

## 2016-03-24 ENCOUNTER — Ambulatory Visit (HOSPITAL_COMMUNITY)
Admission: RE | Admit: 2016-03-24 | Discharge: 2016-03-24 | Disposition: A | Discharge status: Home / Self Care | Payer: Medicare Other | Source: Ambulatory Visit | Attending: Cardiovascular Disease | Admitting: Cardiovascular Disease

## 2016-03-24 DIAGNOSIS — R079 Chest pain, unspecified: Secondary | ICD-10-CM | POA: Insufficient documentation

## 2016-03-24 DIAGNOSIS — R0602 Shortness of breath: Secondary | ICD-10-CM

## 2016-03-24 DIAGNOSIS — I350 Nonrheumatic aortic (valve) stenosis: Secondary | ICD-10-CM

## 2016-03-24 LAB — EXERCISE TOLERANCE TEST
CHL CUP RESTING HR STRESS: 77 {beats}/min
CSEPEW: 4.9 METS
CSEPPHR: 169 {beats}/min
Exercise duration (min): 3 min
Exercise duration (sec): 34 s
MPHR: 149 {beats}/min
Percent HR: 113 %
RPE: 19

## 2016-03-26 ENCOUNTER — Telehealth: Payer: Self-pay

## 2016-03-26 DIAGNOSIS — E785 Hyperlipidemia, unspecified: Secondary | ICD-10-CM

## 2016-03-26 NOTE — Telephone Encounter (Signed)
Notified patient of recommendations. Patient states that she took Atorvastatin for 1 year which she states was ineffective in lowering her cholesterol. Also tried Crestor and experience "aches and pains" all over. She couldn't tell me what dose. Recommended life style changes - diet and exercise. Repeat labs in 3 months. Patient verbalized understanding and agreed with plan.  Lab ordered and paperwork mailed to patient.

## 2016-03-26 NOTE — Telephone Encounter (Signed)
-----   Message from Thurmon FairMihai Croitoru, MD sent at 03/20/2016  8:14 PM EST ----- Her cholesterol is very severely elevated. Her previous chest CT shows evidence of calcium/atherosclerosis in the coronaries. I think is very important that we find a way to treat her cholesterol. I suggest we retry rosuvastatin 20 mg once daily and check lipids in 3 months. If she develops severe joint/muscle problems again I'd like to see her back in clinic to discuss other options. MCr

## 2016-04-06 ENCOUNTER — Other Ambulatory Visit: Payer: Self-pay

## 2016-04-06 ENCOUNTER — Ambulatory Visit (HOSPITAL_COMMUNITY): Payer: Medicare Other | Attending: Cardiology

## 2016-04-06 DIAGNOSIS — I081 Rheumatic disorders of both mitral and tricuspid valves: Secondary | ICD-10-CM | POA: Diagnosis not present

## 2016-04-06 DIAGNOSIS — R0602 Shortness of breath: Secondary | ICD-10-CM | POA: Diagnosis not present

## 2016-04-06 DIAGNOSIS — I350 Nonrheumatic aortic (valve) stenosis: Secondary | ICD-10-CM | POA: Diagnosis not present

## 2016-04-10 ENCOUNTER — Telehealth: Payer: Self-pay | Admitting: Cardiovascular Disease

## 2016-04-10 NOTE — Telephone Encounter (Signed)
New Message    Pt states she is returning Hailey Shields's call about her echo results please call

## 2016-04-10 NOTE — Telephone Encounter (Signed)
Returned call to patient-results given:  Notes Recorded by Thurmon FairMihai Croitoru, MD on 04/06/2016 at 1:55 PM EST Mild aortic valve stenosis. Normal heart pump strength. No sign of any residual damage from the pulmonary embolism, normal RV function and PA pressure.  Pt aware and verbalized understanding.

## 2016-06-23 LAB — LIPID PANEL
CHOLESTEROL: 255 mg/dL — AB (ref <200)
HDL: 51 mg/dL (ref >50)
LDL CALC: 171 mg/dL — AB (ref <100)
Total CHOL/HDL Ratio: 5 Ratio — ABNORMAL HIGH (ref <5.0)
Triglycerides: 167 mg/dL — ABNORMAL HIGH (ref <150)
VLDL: 33 mg/dL — AB (ref <30)

## 2016-06-24 ENCOUNTER — Telehealth: Payer: Self-pay

## 2016-06-24 DIAGNOSIS — E785 Hyperlipidemia, unspecified: Secondary | ICD-10-CM

## 2016-06-24 MED ORDER — PRAVASTATIN SODIUM 40 MG PO TABS: 40.0000 mg | ORAL_TABLET | Freq: Every evening | ORAL | 11 refills | Status: DC

## 2016-06-24 NOTE — Telephone Encounter (Signed)
-----   Message from Thurmon FairMihai Croitoru, MD sent at 06/23/2016  1:24 PM EDT ----- Cholesterol is still very high. If she is willing, try pravastatin 40 mg at bedtime daily, recheck in 3 months. Tell her it is milder than both atorvastatin rosuvastatin and least likely to cause side effects.

## 2016-06-24 NOTE — Telephone Encounter (Signed)
Notes recorded by Neta Ehlersruitt, Angela M, CMA on 06/24/2016 at 1:29 PM EDT Called patient with results. Patient verbalized understanding and agreed with plan.  Prescription sent to patient's preferred pharmacy electronically. Repeat lab ordered. Lab slip will be mailed to patient.

## 2016-09-24 LAB — LIPID PANEL
CHOL/HDL RATIO: 5 ratio — AB (ref <5.0)
CHOLESTEROL: 280 mg/dL — AB (ref <200)
HDL: 56 mg/dL (ref >50)
LDL Cholesterol: 195 mg/dL — ABNORMAL HIGH (ref <100)
TRIGLYCERIDES: 146 mg/dL (ref <150)
VLDL: 29 mg/dL (ref <30)

## 2016-10-02 ENCOUNTER — Telehealth: Payer: Self-pay | Admitting: Pharmacist

## 2016-10-02 NOTE — Telephone Encounter (Signed)
LDL elevated and higher than last year. Patient on pravastatin 40mg  daily??  Per Dr Royann Shivers recommendation, will try to get approval for [facility] for primary prevention.   LMOM; patient to call back to discuss options and schedule appointment for Lipid clinic.

## 2016-10-05 NOTE — Telephone Encounter (Signed)
2nd message  [facility]; patient to call back to set appointment for lipid clinic and discuss lipid management options

## 2016-10-06 NOTE — Telephone Encounter (Signed)
Patient tried statins in the past and had sever pain with atorvastatin, rosuvastatin and pravastatin in the past.   Patient got tearful on the phone and is VERY upset about "doctors not listening to her and trying to give her something that may cause hard". She understand her cholesterol if high but expressed she never had problems with her heart and her father had high cholesterol and leave to age 72.  She in NOT interested on additional cholesterol therapy especially statins or injectable products.

## 2016-11-02 ENCOUNTER — Encounter: Payer: Self-pay | Admitting: Cardiovascular Disease

## 2016-11-02 ENCOUNTER — Telehealth: Payer: Self-pay

## 2016-11-02 NOTE — Telephone Encounter (Signed)
1. Type of surgery: Right TKA - medial & lateral w/wo patella resurfacing 2. Date of surgery: 12/21/16 3. Surgeon: Dr Durene Romans 4. Medications that need to be held & how long: N/A 5. Fax and/or Phone: (p) [phone] (f) [phone]

## 2016-11-02 NOTE — Telephone Encounter (Signed)
epicd 

## 2016-11-06 NOTE — Telephone Encounter (Signed)
Letter faxed to [facility] Rosalva Ferron' attention via Epic.

## 2016-12-08 NOTE — Patient Instructions (Addendum)
Hailey CopaJudy M Shields  12/08/2016   Your procedure is scheduled on: 12/21/2016    [facility] .  Report to admitting at   0915 AM   Call this number if you have problems the morning of surgery [phone]   Remember: ONLY 1 PERSON MAY GO WITH YOU TO SHORT STAY TO GET  READY MORNING OF YOUR SURGERY.  Do not eat food or drink liquids :After Midnight.     Take these medicines the morning of surgery with A SIP OF WATER: none                                 You may not have any metal on your body including hair pins and              piercings  Do not wear jewelry, make-up, lotions, powders or perfumes, deodorant             Do not wear nail polish.  Do not shave  48 hours prior to surgery.     Do not bring valuables to the hospital. [facility] IS NOT             RESPONSIBLE   FOR VALUABLES.  Contacts, dentures or bridgework may not be worn into surgery.  Leave suitcase in the car. After surgery it may be brought to your room.                       Please read over the following fact sheets you were given: _____________________________________________________________________             [facility] Pre[facility] Before surgery, you can play an important role.  Because skin is not sterile, your skin needs to be as free of germs as possible.  You can reduce the number of germs on your skin by washing with CHG (chlorahexidine gluconate) soap before surgery.  CHG is an antiseptic cleaner which kills germs and bonds with the skin to continue killing germs even after washing. Please DO NOT use if you have an allergy to CHG or antibacterial soaps.  If your skin becomes reddened/irritated stop using the CHG and inform your nurse when you arrive at Short Stay. Do not shave (including legs and underarms) for at least 48 hours prior to the first CHG shower.  You may shave your face/neck. Please follow these instructions carefully:  1.   Shower with CHG Soap the night before surgery and the  morning of Surgery.  2.  If you choose to wash your hair, wash your hair first as usual with your  normal  shampoo.  3.  After you shampoo, rinse your hair and body thoroughly to remove the  shampoo.                           4.  Use CHG as you would any other liquid soap.  You can apply chg directly  to the skin and wash                       Gently with a scrungie or clean washcloth.  5.  Apply the CHG Soap to your body ONLY FROM THE NECK DOWN.   Do not  use on face/ open                           Wound or open sores. Avoid contact with eyes, ears mouth and genitals (private parts).                       Wash face,  Genitals (private parts) with your normal soap.             6.  Wash thoroughly, paying special attention to the area where your surgery  will be performed.  7.  Thoroughly rinse your body with warm water from the neck down.  8.  DO NOT shower/wash with your normal soap after using and rinsing off  the CHG Soap.                9.  Pat yourself dry with a clean towel.            10.  Wear clean pajamas.            11.  Place clean sheets on your bed the night of your first shower and do not  sleep with pets. Day of Surgery : Do not apply any lotions/deodorants the morning of surgery.  Please wear clean clothes to the hospital/surgery center.  FAILURE TO FOLLOW THESE INSTRUCTIONS MAY RESULT IN THE CANCELLATION OF YOUR SURGERY PATIENT SIGNATURE_________________________________  NURSE SIGNATURE__________________________________  ________________________________________________________________________  WHAT IS A BLOOD TRANSFUSION? Blood Transfusion Information  A transfusion is the replacement of blood or some of its parts. Blood is made up of multiple cells which provide different functions.  Red blood cells carry oxygen and are used for blood loss replacement.  White blood cells fight against infection.  Platelets control  bleeding.  Plasma helps clot blood.  Other blood products are available for specialized needs, such as hemophilia or other clotting disorders. BEFORE THE TRANSFUSION  Who gives blood for transfusions?   Healthy volunteers who are fully evaluated to make sure their blood is safe. This is blood bank blood. Transfusion therapy is the safest it has ever been in the practice of medicine. Before blood is taken from a donor, a complete history is taken to make sure that person has no history of diseases nor engages in risky social behavior (examples are intravenous drug use or sexual activity with multiple partners). The donor's travel history is screened to minimize risk of transmitting infections, such as malaria. The donated blood is tested for signs of infectious diseases, such as HIV and hepatitis. The blood is then tested to be sure it is compatible with you in order to minimize the chance of a transfusion reaction. If you or a relative donates blood, this is often done in anticipation of surgery and is not appropriate for emergency situations. It takes many days to process the donated blood. RISKS AND COMPLICATIONS Although transfusion therapy is very safe and saves many lives, the main dangers of transfusion include:   Getting an infectious disease.  Developing a transfusion reaction. This is an allergic reaction to something in the blood you were given. Every precaution is taken to prevent this. The decision to have a blood transfusion has been considered carefully by your caregiver before blood is given. Blood is not given unless the benefits outweigh the risks. AFTER THE TRANSFUSION  Right after receiving a blood transfusion, you will usually feel much better and more energetic. This is especially true if  your red blood cells have gotten low (anemic). The transfusion raises the level of the red blood cells which carry oxygen, and this usually causes an energy increase.  The nurse  administering the transfusion will monitor you carefully for complications. HOME CARE INSTRUCTIONS  No special instructions are needed after a transfusion. You may find your energy is better. Speak with your caregiver about any limitations on activity for underlying diseases you may have. SEEK MEDICAL CARE IF:   Your condition is not improving after your transfusion.  You develop redness or irritation at the intravenous (IV) site. SEEK IMMEDIATE MEDICAL CARE IF:  Any of the following symptoms occur over the next 12 hours:  Shaking chills.  You have a temperature by mouth above 102 F (38.9 C), not controlled by medicine.  Chest, back, or muscle pain.  People around you feel you are not acting correctly or are confused.  Shortness of breath or difficulty breathing.  Dizziness and fainting.  You get a rash or develop hives.  You have a decrease in urine output.  Your urine turns a dark color or changes to pink, red, or brown. Any of the following symptoms occur over the next 10 days:  You have a temperature by mouth above 102 F (38.9 C), not controlled by medicine.  Shortness of breath.  Weakness after normal activity.  The white part of the eye turns yellow (jaundice).  You have a decrease in the amount of urine or are urinating less often.  Your urine turns a dark color or changes to pink, red, or brown. Document Released: 01/31/2000 Document Revised: 04/27/2011 Document Reviewed: 09/19/2007 ExitCare Patient Information 2014 [place].  _______________________________________________________________________  Incentive Spirometer  An incentive spirometer is a tool that can help keep your lungs clear and active. This tool measures how well you are filling your lungs with each breath. Taking long deep breaths may help reverse or decrease the chance of developing breathing (pulmonary) problems (especially infection) following:  A long period of time when you are  unable to move or be active. BEFORE THE PROCEDURE   If the spirometer includes an indicator to show your best effort, your nurse or respiratory therapist will set it to a desired goal.  If possible, sit up straight or lean slightly forward. Try not to slouch.  Hold the incentive spirometer in an upright position. INSTRUCTIONS FOR USE  1. Sit on the edge of your bed if possible, or sit up as far as you can in bed or on a chair. 2. Hold the incentive spirometer in an upright position. 3. Breathe out normally. 4. Place the mouthpiece in your mouth and seal your lips tightly around it. 5. Breathe in slowly and as deeply as possible, raising the piston or the ball toward the top of the column. 6. Hold your breath for 3-5 seconds or for as long as possible. Allow the piston or ball to fall to the bottom of the column. 7. Remove the mouthpiece from your mouth and breathe out normally. 8. Rest for a few seconds and repeat Steps 1 through 7 at least 10 times every 1-2 hours when you are awake. Take your time and take a few normal breaths between deep breaths. 9. The spirometer may include an indicator to show your best effort. Use the indicator as a goal to work toward during each repetition. 10. After each set of 10 deep breaths, practice coughing to be sure your lungs are clear. If you have an incision (  the cut made at the time of surgery), support your incision when coughing by placing a pillow or rolled up towels firmly against it. Once you are able to get out of bed, walk around indoors and cough well. You may stop using the incentive spirometer when instructed by your caregiver.  RISKS AND COMPLICATIONS  Take your time so you do not get dizzy or light-headed.  If you are in pain, you may need to take or ask for pain medication before doing incentive spirometry. It is harder to take a deep breath if you are having pain. AFTER USE  Rest and breathe slowly and easily.  It can be helpful to  keep track of a log of your progress. Your caregiver can provide you with a simple table to help with this. If you are using the spirometer at home, follow these instructions: [place] IF:   You are having difficultly using the spirometer.  You have trouble using the spirometer as often as instructed.  Your pain medication is not giving enough relief while using the spirometer.  You develop fever of 100.5 F (38.1 C) or higher. SEEK IMMEDIATE MEDICAL CARE IF:   You cough up bloody sputum that had not been present before.  You develop fever of 102 F (38.9 C) or greater.  You develop worsening pain at or near the incision site. MAKE SURE YOU:   Understand these instructions.  Will watch your condition.  Will get help right away if you are not doing well or get worse. Document Released: 06/15/2006 Document Revised: 04/27/2011 Document Reviewed: 08/16/2006 [facility] Patient Information 2014 [place], [place].   ________________________________________________________________________

## 2016-12-09 NOTE — H&P (Signed)
TOTAL KNEE ADMISSION H&P  Patient is being admitted for right total knee arthroplasty.  Subjective:  Chief Complaint:   Right knee primary OA / pain  HPI: Hailey Shields, 71 y.o. female, has a history of pain and functional disability in the right knee due to arthritis and has failed non-surgical conservative treatments for greater than 12 weeks to include corticosteriod injections and activity modification.  Onset of symptoms was gradual, starting ~1 years ago with gradually worsening course since that time. The patient noted prior procedures on the knee to include  arthroplasty on the left knee in July 2017 per Dr. Charlann Boxer.  Patient currently rates pain in the right knee(s) at 7 out of 10 with activity. Patient has night pain, worsening of pain with activity and weight bearing, pain that interferes with activities of daily living, pain with passive range of motion, crepitus and joint swelling.  Patient has evidence of periarticular osteophytes and joint space narrowing by imaging studies. There is no active infection.   Risks, benefits and expectations were discussed with the patient.  Risks including but not limited to the risk of anesthesia, blood clots, nerve damage, blood vessel damage, failure of the prosthesis, infection and up to and including death.  Patient understand the risks, benefits and expectations and wishes to proceed with surgery.   PCP: Olen Cordial, MD  D/C Plans:       Home   Post-op Meds:       No Rx given  Tranexamic Acid:      To be given - topically (previous PE)  Decadron:      Is to be given  FYI:     Eliquis 5 mg bid  (for at least a month)  APAP  (can't tolerate any other analgesic medications)  DME:   Pt already has equipment   PT:   HHPT (as with last surgery)   Patient Active Problem List   Diagnosis Date Noted  . Essential hypertension 03/20/2016  . Nonrheumatic aortic valve stenosis 03/20/2016  . Mixed hyperlipidemia 03/20/2016  . Coronary artery  calcification seen on CT scan 03/20/2016  . History of pulmonary embolism 03/20/2016  . Morbid obesity (HCC) 08/29/2015  . S/P left TKA 08/27/2015  . Small bowel obstruction (HCC) 11/26/2014  . Incarcerated incisional hernia 11/26/2014   Past Medical History:  Diagnosis Date  . Arthritis   . Carpal tunnel syndrome of right wrist    effects thumb and first two fingers  . Fall   . Heart murmur   . History of bronchitis   . History of chemotherapy   . History of kidney stones   . History of vertigo   . Hypertension   . Ovarian cancer (HCC)   . Pneumonia   . Stroke (HCC)   . Wears glasses     Past Surgical History:  Procedure Laterality Date  . ABDOMINAL HYSTERECTOMY    . BACK SURGERY    . CHOLECYSTECTOMY    . INSERTION OF MESH N/A 11/27/2014   Procedure: INSERTION OF MESH;  Surgeon: Darnell Level, MD;  Location: [facility];  Service: General;  Laterality: N/A;  . LAPAROTOMY N/A 11/27/2014   Procedure: EXPLORATORY LAPAROTOMY;  Surgeon: Darnell Level, MD;  Location: [facility];  Service: General;  Laterality: N/A;  . TONSILLECTOMY    . TOTAL KNEE ARTHROPLASTY Left 08/27/2015   Procedure: LEFT TOTAL KNEE ARTHROPLASTY;  Surgeon: Durene Romans, MD;  Location: [facility];  Service: Orthopedics;  Laterality: Left;  . UMBILICAL HERNIA  REPAIR    . VENTRAL HERNIA REPAIR     with mesh  . VENTRAL HERNIA REPAIR N/A 11/27/2014   Procedure: HERNIA REPAIR VENTRAL ADULT;  Surgeon: Darnell Levelodd Gerkin, MD;  Location: [facility];  Service: General;  Laterality: N/A;    No current facility-administered medications for this encounter.    Current Outpatient Prescriptions  Medication Sig Dispense Refill Last Dose  . aspirin EC 325 MG tablet Take 325 mg by mouth daily.     . chlorpheniramine (ALLER-CHLOR) 4 MG tablet Take 4 mg by mouth daily.    Taking  . lisinopril-hydrochlorothiazide (PRINZIDE,ZESTORETIC) 20-12.5 MG per tablet Take 1 tablet by mouth daily.   Taking  . Multiple Vitamin (MULTIVITAMIN) tablet Take 1 tablet  by mouth daily.   Not Taking  . sodium chloride (OCEAN) 0.65 % SOLN nasal spray Place 2 sprays into both nostrils daily as needed for congestion.   Taking  . pravastatin (PRAVACHOL) 40 MG tablet Take 1 tablet (40 mg total) by mouth every evening. (Patient not taking: Reported on 12/07/2016) 30 tablet 11 Not Taking at Unknown time   Allergies  Allergen Reactions  . Codeine Other (See Comments)    Hallucinations: includes oxycodone and hydrocodone  . Prednisone Other (See Comments)    Increase heart rate  . Tramadol Other (See Comments)    vomitting  . Zithromax [Azithromycin] Other (See Comments)    Increase heart rate    Social History  Substance Use Topics  . Smoking status: Never Smoker  . Smokeless tobacco: Never Used  . Alcohol use No    Family History  Problem Relation Age of Onset  . Heart attack Brother      Review of Systems  Constitutional: Negative.   HENT: Negative.   Eyes: Negative.   Respiratory: Negative.   Cardiovascular: Negative.   Gastrointestinal: Negative.   Genitourinary: Negative.   Musculoskeletal: Positive for joint pain.  Skin: Negative.   Neurological: Negative.   Endo/Heme/Allergies: Negative.   Psychiatric/Behavioral: Negative.     Objective:  Physical Exam  Constitutional: She is oriented to person, place, and time. She appears well-developed.  HENT:  Head: Normocephalic.  Eyes: Pupils are equal, round, and reactive to light.  Neck: Neck supple. No JVD present. No tracheal deviation present. No thyromegaly present.  Cardiovascular: Normal rate, regular rhythm and intact distal pulses.   Murmur heard. Respiratory: Effort normal and breath sounds normal. No respiratory distress. She has no wheezes.  GI: Soft. There is no tenderness. There is no guarding.  Musculoskeletal:       Right knee: She exhibits decreased range of motion, swelling and bony tenderness. She exhibits no ecchymosis, no deformity, no laceration and no erythema.  Tenderness found.  Lymphadenopathy:    She has no cervical adenopathy.  Neurological: She is alert and oriented to person, place, and time.  Skin: Skin is warm and dry.  Psychiatric: She has a normal mood and affect.      Labs:  Estimated body mass index is 40.46 kg/m as calculated from the following:   Height as of 03/20/16: 5\' 2"  (1.575 m).   Weight as of 03/20/16: 100.3 kg (221 lb 3.2 oz).   Imaging Review Plain radiographs demonstrate severe degenerative joint disease of the right knee. The overall alignment is neutral. The bone quality appears to be good for age and reported activity level.  Assessment/Plan:  End stage arthritis, right knee   The patient history, physical examination, clinical judgment of the provider and imaging studies  are consistent with end stage degenerative joint disease of the right knee(s) and total knee arthroplasty is deemed medically necessary. The treatment options including medical management, injection therapy arthroscopy and arthroplasty were discussed at length. The risks and benefits of total knee arthroplasty were presented and reviewed. The risks due to aseptic loosening, infection, stiffness, patella tracking problems, thromboembolic complications and other imponderables were discussed. The patient acknowledged the explanation, agreed to proceed with the plan and consent was signed. Patient is being admitted for inpatient treatment for surgery, pain control, PT, OT, prophylactic antibiotics, VTE prophylaxis, progressive ambulation and ADL's and discharge planning. The patient is planning to be discharged home with home health services.     Anastasio Auerbach Maxie Slovacek   PA-C  12/09/2016, 2:04 PM

## 2016-12-10 ENCOUNTER — Encounter (HOSPITAL_COMMUNITY)
Admission: RE | Admit: 2016-12-10 | Discharge: 2016-12-10 | Disposition: A | Discharge status: Home / Self Care | Payer: Medicare Other | Source: Ambulatory Visit | Attending: Orthopedic Surgery | Admitting: Orthopedic Surgery

## 2016-12-10 ENCOUNTER — Encounter (HOSPITAL_COMMUNITY): Payer: Self-pay

## 2016-12-10 DIAGNOSIS — M1711 Unilateral primary osteoarthritis, right knee: Secondary | ICD-10-CM | POA: Diagnosis not present

## 2016-12-10 DIAGNOSIS — Z01818 Encounter for other preprocedural examination: Secondary | ICD-10-CM | POA: Insufficient documentation

## 2016-12-10 LAB — SURGICAL PCR SCREEN
MRSA, PCR: NEGATIVE
STAPHYLOCOCCUS AUREUS: NEGATIVE

## 2016-12-10 LAB — TYPE AND SCREEN
ABO/RH(D): A POS
Antibody Screen: NEGATIVE

## 2016-12-10 LAB — CBC
HCT: 40.9 % (ref 36.0–46.0)
Hemoglobin: 13.9 g/dL (ref 12.0–15.0)
MCH: 31.4 pg (ref 26.0–34.0)
MCHC: 34 g/dL (ref 30.0–36.0)
MCV: 92.3 fL (ref 78.0–100.0)
PLATELETS: 290 10*3/uL (ref 150–400)
RBC: 4.43 MIL/uL (ref 3.87–5.11)
RDW: 12.8 % (ref 11.5–15.5)
WBC: 5.1 10*3/uL (ref 4.0–10.5)

## 2016-12-10 LAB — BASIC METABOLIC PANEL
ANION GAP: 8 (ref 5–15)
BUN: 11 mg/dL (ref 6–20)
CALCIUM: 9.9 mg/dL (ref 8.9–10.3)
CO2: 27 mmol/L (ref 22–32)
CREATININE: 0.57 mg/dL (ref 0.44–1.00)
Chloride: 104 mmol/L (ref 101–111)
GLUCOSE: 103 mg/dL — AB (ref 65–99)
Potassium: 4.2 mmol/L (ref 3.5–5.1)
Sodium: 139 mmol/L (ref 135–145)

## 2016-12-10 NOTE — Progress Notes (Signed)
EKG-03/20/16-epic 04/06/16-echo-epic Stress-03/24/16-epic  LOV- Cardiology- 03/20/16-epic  Clearance- Cardiology - 11/02/16 on chart

## 2016-12-21 ENCOUNTER — Encounter (HOSPITAL_COMMUNITY): Admission: RE | Payer: Self-pay | Source: Ambulatory Visit

## 2016-12-21 ENCOUNTER — Inpatient Hospital Stay (HOSPITAL_COMMUNITY): Admission: RE | Admit: 2016-12-21 | Payer: Medicare Other | Source: Ambulatory Visit | Admitting: Orthopedic Surgery

## 2016-12-21 SURGERY — ARTHROPLASTY, KNEE, TOTAL
Anesthesia: Spinal | Site: Knee | Laterality: Right

## 2017-03-19 ENCOUNTER — Telehealth: Payer: Self-pay

## 2017-03-19 NOTE — H&P (Signed)
TOTAL KNEE ADMISSION H&P  Patient is being admitted for right total knee arthroplasty.  Subjective:  Chief Complaint:  Right knee primary OA / pain  HPI: Hailey Shields, 72 y.o. female, has a history of pain and functional disability in the right knee due to arthritis and has failed non-surgical conservative treatments for greater than 12 weeks to include NSAID's and/or analgesics, corticosteriod injections and activity modification.  Onset of symptoms was gradual, starting ~1 years ago with gradually worsening course since that time. The patient noted no past surgery on the right knee(s).  Patient currently rates pain in the right knee(s) at 10 out of 10 with activity. Patient has night pain, worsening of pain with activity and weight bearing, pain that interferes with activities of daily living, pain with passive range of motion, crepitus and joint swelling.  Patient has evidence of periarticular osteophytes and joint space narrowing by imaging studies.  There is no active infection.  Risks, benefits and expectations were discussed with the patient.  Risks including but not limited to the risk of anesthesia, blood clots, nerve damage, blood vessel damage, failure of the prosthesis, infection and up to and including death.  Patient understand the risks, benefits and expectations and wishes to proceed with surgery.   PCP: Olen Cordial, MD  D/C Plans:       Home   Post-op Meds:       No Rx given  Tranexamic Acid:      To be given -  topically  Decadron:      Is to be given  FYI:     Xarelto (1 month)  APAP (can't tolerate tramadol or codeine)  DME:   Equipment arranged  PT:   Virtual     Patient Active Problem List   Diagnosis Date Noted  . Essential hypertension 03/20/2016  . Nonrheumatic aortic valve stenosis 03/20/2016  . Mixed hyperlipidemia 03/20/2016  . Coronary artery calcification seen on CT scan 03/20/2016  . History of pulmonary embolism 03/20/2016  . Morbid obesity  (HCC) 08/29/2015  . S/P left TKA 08/27/2015  . Small bowel obstruction (HCC) 11/26/2014  . Incarcerated incisional hernia 11/26/2014   Past Medical History:  Diagnosis Date  . Arthritis   . Carpal tunnel syndrome of right wrist    effects thumb and first two fingers  . Fall   . Headache    hx of migraines   . Heart murmur   . History of bronchitis   . History of chemotherapy   . History of kidney stones   . History of vertigo   . Hypertension   . Ovarian cancer (HCC)   . Pneumonia    hx of   . Stroke [facility])    TIA- right after chemotherapy completed- 2004   . Wears glasses     Past Surgical History:  Procedure Laterality Date  . ABDOMINAL HYSTERECTOMY    . BACK SURGERY    . CHOLECYSTECTOMY    . INSERTION OF MESH N/A 11/27/2014   Procedure: INSERTION OF MESH;  Surgeon: Darnell Level, MD;  Location: [facility];  Service: General;  Laterality: N/A;  . LAPAROTOMY N/A 11/27/2014   Procedure: EXPLORATORY LAPAROTOMY;  Surgeon: Darnell Level, MD;  Location: [facility];  Service: General;  Laterality: N/A;  . TONSILLECTOMY    . TOTAL KNEE ARTHROPLASTY Left 08/27/2015   Procedure: LEFT TOTAL KNEE ARTHROPLASTY;  Surgeon: Durene Romans, MD;  Location: [facility];  Service: Orthopedics;  Laterality: Left;  . UMBILICAL HERNIA REPAIR    .  VENTRAL HERNIA REPAIR     with mesh  . VENTRAL HERNIA REPAIR N/A 11/27/2014   Procedure: HERNIA REPAIR VENTRAL ADULT;  Surgeon: Darnell Levelodd Gerkin, MD;  Location: [facility];  Service: General;  Laterality: N/A;    No current facility-administered medications for this encounter.    Current Outpatient Medications  Medication Sig Dispense Refill Last Dose  . aspirin EC 325 MG tablet Take 325 mg by mouth daily.     . chlorpheniramine (ALLER-CHLOR) 4 MG tablet Take 4 mg by mouth daily.    Taking  . lisinopril-hydrochlorothiazide (PRINZIDE,ZESTORETIC) 20-12.5 MG per tablet Take 1 tablet by mouth daily.   Taking  . Multiple Vitamin (MULTIVITAMIN) tablet Take 1 tablet by mouth  daily.   Not Taking  . pravastatin (PRAVACHOL) 40 MG tablet Take 1 tablet (40 mg total) by mouth every evening. (Patient not taking: Reported on 12/07/2016) 30 tablet 11 Not Taking at Unknown time  . sodium chloride (OCEAN) 0.65 % SOLN nasal spray Place 2 sprays into both nostrils daily as needed for congestion.   Taking   Allergies  Allergen Reactions  . Codeine Other (See Comments)    Hallucinations: includes oxycodone and hydrocodone  . Prednisone Other (See Comments)    Increase heart rate  . Tramadol Other (See Comments)    vomitting  . Zithromax [Azithromycin] Other (See Comments)    Increase heart rate    Social History   Tobacco Use  . Smoking status: Never Smoker  . Smokeless tobacco: Never Used  Substance Use Topics  . Alcohol use: No    Family History  Problem Relation Age of Onset  . Heart attack Brother      Review of Systems  Constitutional: Negative.   HENT: Negative.   Eyes: Negative.   Respiratory: Negative.   Cardiovascular: Negative.   Gastrointestinal: Negative.   Genitourinary: Negative.   Musculoskeletal: Positive for back pain and joint pain.  Skin: Negative.   Neurological: Negative.   Endo/Heme/Allergies: Negative.   Psychiatric/Behavioral: Negative.     Objective:  Physical Exam  Constitutional: She is oriented to person, place, and time. She appears well-developed.  HENT:  Head: Normocephalic.  Eyes: Pupils are equal, round, and reactive to light.  Neck: Neck supple. No JVD present. No tracheal deviation present. No thyromegaly present.  Cardiovascular: Normal rate, regular rhythm and intact distal pulses.  Respiratory: Effort normal and breath sounds normal. No respiratory distress. She has no wheezes.  GI: Soft. There is no tenderness. There is no guarding.  Musculoskeletal:       Right knee: She exhibits decreased range of motion, swelling and bony tenderness. She exhibits no ecchymosis, no deformity, no laceration and no erythema.  Tenderness found.  Lymphadenopathy:    She has no cervical adenopathy.  Neurological: She is alert and oriented to person, place, and time.  Skin: Skin is warm and dry.  Psychiatric: She has a normal mood and affect.      Labs:  Estimated body mass index is 34.9 kg/m as calculated from the following:   Height as of 12/10/16: 5\' 3"  (1.6 m).   Weight as of 12/10/16: 89.4 kg (197 lb).   Imaging Review Plain radiographs demonstrate severe degenerative joint disease of the right knee.  The bone quality appears to be good for age and reported activity level.  Assessment/Plan:  End stage arthritis, right knee   The patient history, physical examination, clinical judgment of the provider and imaging studies are consistent with end stage degenerative joint  disease of the right knee(s) and total knee arthroplasty is deemed medically necessary. The treatment options including medical management, injection therapy arthroscopy and arthroplasty were discussed at length. The risks and benefits of total knee arthroplasty were presented and reviewed. The risks due to aseptic loosening, infection, stiffness, patella tracking problems, thromboembolic complications and other imponderables were discussed. The patient acknowledged the explanation, agreed to proceed with the plan and consent was signed. Patient is being admitted for inpatient treatment for surgery, pain control, PT, OT, prophylactic antibiotics, VTE prophylaxis, progressive ambulation and ADL's and discharge planning. The patient is planning to be discharged home.     Anastasio Auerbach Law Corsino   PA-C  03/19/2017, 8:50 AM

## 2017-03-19 NOTE — Telephone Encounter (Signed)
Spoke with patient. Let her know she couldn't be cleared for surgery until she was seen in office. Office visit scheduled for 2/18 at 10:00 am w/ Dr. Salena Saner @ [facility] office. Patient verbalized understanding.

## 2017-03-19 NOTE — Telephone Encounter (Signed)
   [facility] Pre-operative Risk Assessment    Request for surgical clearance:  1. What type of surgery is being performed? Right TKA-medial & lateral w/wo patella resurfacing   2. When is this surgery scheduled? PENDING   3. What type of clearance is required (medical clearance vs. Pharmacy clearance to hold med vs. Both)? MEDICAL CLEARANCE ONLY  4. Are there any medications that need to be held prior to surgery and how long?NONE LISTED   5. Practice name and name of physician performing surgery? [facility]   6. What is your office phone and fax number? [facility]  7. Anesthesia type (None, local, MAC, general) ? NONE LISTED   Waylan Rocher 03/19/2017, 10:20 AM  _________________________________________________________________   (provider comments below)

## 2017-03-19 NOTE — Telephone Encounter (Signed)
   Primary Cardiologist:Mihai Croitoru, MD  Chart reviewed as part of pre-operative protocol coverage. Because of Bonnita LevanJudy M Record's past medical history and time since last visit, he/she will require a follow-up visit in order to better assess preoperative cardiovascular risk.  Pre-op covering staff: - Please schedule appointment and call patient to inform them. - Please contact requesting surgeon's office via preferred method (i.e, phone, fax) to inform them of need for appointment prior to surgery.  [place] Winnebago[place]hav[place]umar Stephonie Wilcoxen, [place]  03/19/2017, 4:26 PM

## 2017-03-22 ENCOUNTER — Telehealth: Payer: Self-pay

## 2017-03-22 NOTE — Telephone Encounter (Signed)
Tried to reach surgeons office to inform them of patient's need for an office visit prior to surgical clearance.  Left a message.

## 2017-03-22 NOTE — Telephone Encounter (Signed)
Callback staff, please inform surgeon's office of need for appointment as well. Dayna Dunn PA-C

## 2017-03-24 NOTE — Telephone Encounter (Signed)
Called Cordelia PenSherry unable to leave message per voicemail request she stated she is out of the office today and will return tomorrow.

## 2017-03-25 NOTE — Telephone Encounter (Signed)
Left message for Cordelia PenSherry that patient will not be cleared until evaluated by Dr. Royann Shiversroitoru on Feb 18.

## 2017-04-01 ENCOUNTER — Other Ambulatory Visit: Payer: Self-pay

## 2017-04-01 ENCOUNTER — Encounter (HOSPITAL_COMMUNITY): Payer: Self-pay

## 2017-04-01 ENCOUNTER — Encounter (HOSPITAL_COMMUNITY)
Admission: RE | Admit: 2017-04-01 | Discharge: 2017-04-01 | Disposition: A | Discharge status: Home / Self Care | Payer: Medicare Other | Source: Ambulatory Visit | Attending: Orthopedic Surgery | Admitting: Orthopedic Surgery

## 2017-04-01 DIAGNOSIS — Z01812 Encounter for preprocedural laboratory examination: Secondary | ICD-10-CM | POA: Insufficient documentation

## 2017-04-01 DIAGNOSIS — R9431 Abnormal electrocardiogram [ECG] [EKG]: Secondary | ICD-10-CM | POA: Insufficient documentation

## 2017-04-01 DIAGNOSIS — Z0183 Encounter for blood typing: Secondary | ICD-10-CM | POA: Insufficient documentation

## 2017-04-01 DIAGNOSIS — M1711 Unilateral primary osteoarthritis, right knee: Secondary | ICD-10-CM | POA: Insufficient documentation

## 2017-04-01 DIAGNOSIS — Z01818 Encounter for other preprocedural examination: Secondary | ICD-10-CM | POA: Diagnosis present

## 2017-04-01 HISTORY — DX: Personal history of malignant neoplasm of ovary: Z85.43

## 2017-04-01 HISTORY — DX: Unspecified osteoarthritis, unspecified site: M19.90

## 2017-04-01 HISTORY — DX: Personal history of other medical treatment: Z92.89

## 2017-04-01 HISTORY — DX: Personal history of pulmonary embolism: Z86.711

## 2017-04-01 HISTORY — DX: Nonrheumatic aortic (valve) stenosis: I35.0

## 2017-04-01 HISTORY — DX: Personal history of other venous thrombosis and embolism: Z86.718

## 2017-04-01 HISTORY — DX: Personal history of transient ischemic attack (TIA), and cerebral infarction without residual deficits: Z86.73

## 2017-04-01 LAB — BASIC METABOLIC PANEL
Anion gap: 10 (ref 5–15)
BUN: 14 mg/dL (ref 6–20)
CHLORIDE: 104 mmol/L (ref 101–111)
CO2: 25 mmol/L (ref 22–32)
CREATININE: 0.55 mg/dL (ref 0.44–1.00)
Calcium: 9.9 mg/dL (ref 8.9–10.3)
GFR calc Af Amer: >60 mL/min (ref >60)
GFR calc non Af Amer: >60 mL/min (ref >60)
GLUCOSE: 100 mg/dL — AB (ref 65–99)
Potassium: 3.6 mmol/L (ref 3.5–5.1)
SODIUM: 139 mmol/L (ref 135–145)

## 2017-04-01 LAB — CBC
HCT: 42.1 % (ref 36.0–46.0)
Hemoglobin: 14.1 g/dL (ref 12.0–15.0)
MCH: 30.7 pg (ref 26.0–34.0)
MCHC: 33.5 g/dL (ref 30.0–36.0)
MCV: 91.7 fL (ref 78.0–100.0)
PLATELETS: 301 10*3/uL (ref 150–400)
RBC: 4.59 MIL/uL (ref 3.87–5.11)
RDW: 13 % (ref 11.5–15.5)
WBC: 5.4 10*3/uL (ref 4.0–10.5)

## 2017-04-01 LAB — SURGICAL PCR SCREEN
MRSA, PCR: NEGATIVE
Staphylococcus aureus: POSITIVE — AB

## 2017-04-01 NOTE — Patient Instructions (Addendum)
Hailey Shields  04/01/2017   Your procedure is scheduled on:  04-06-2017  [facility]  Report to admitting at   10:15 AM  Call this number if you have problems the morning of surgery [phone]   Remember: Do not eat food  After Midnight. With exception clear liquids until 6:45 AM after 6:45 am nothing by mouth including candy, gum, or mints.     Take these medicines the morning of surgery with A SIP OF WATER:  none                                You may not have any metal on your body including hair pins and              piercings  Do not wear jewelry, make-up, lotions, powders or perfumes, deodorant             Do not wear nail polish.  Do not shave  48 hours prior to surgery.                 Do not bring valuables to the hospital. [facility] IS NOT             RESPONSIBLE   FOR VALUABLES.  Contacts, dentures or bridgework may not be worn into surgery.  Leave suitcase in the car. After surgery it may be brought to your room.         Special Instructions:   Deep breathing and leg exercises              Please read over the following fact sheets you were given: _____________________________________________________________________             [facility] Before surgery, you can play an important role.  Because skin is not sterile, your skin needs to be as free of germs as possible.  You can reduce the number of germs on your skin by washing with CHG (chlorahexidine gluconate) soap before surgery.  CHG is an antiseptic cleaner which kills germs and bonds with the skin to continue killing germs even after washing. Please DO NOT use if you have an allergy to CHG or antibacterial soaps.  If your skin becomes reddened/irritated stop using the CHG and inform your nurse when you arrive at Short Stay. Do not shave (including legs and underarms) for at least 48 hours prior to the first CHG shower.  You may shave your  face/neck. Please follow these instructions carefully:  1.  Shower with CHG Soap the night before surgery and the  morning of Surgery.  2.  If you choose to wash your hair, wash your hair first as usual with your  normal  shampoo.  3.  After you shampoo, rinse your hair and body thoroughly to remove the  shampoo.                           4.  Use CHG as you would any other liquid soap.  You can apply chg directly  to the skin and wash                       Gently with a scrungie or clean washcloth.  5.  Apply the CHG  Soap to your body ONLY FROM THE NECK DOWN.   Do not use on face/ open                           Wound or open sores. Avoid contact with eyes, ears mouth and genitals (private parts).                       Wash face,  Genitals (private parts) with your normal soap.             6.  Wash thoroughly, paying special attention to the area where your surgery  will be performed.  7.  Thoroughly rinse your body with warm water from the neck down.  8.  DO NOT shower/wash with your normal soap after using and rinsing off  the CHG Soap.                9.  Pat yourself dry with a clean towel.            10.  Wear clean pajamas.            11.  Place clean sheets on your bed the night of your first shower and do not  sleep with pets. Day of Surgery : Do not apply any lotions/deodorants the morning of surgery.  Please wear clean clothes to the hospital/surgery center.  FAILURE TO FOLLOW THESE INSTRUCTIONS MAY RESULT IN THE CANCELLATION OF YOUR SURGERY PATIENT SIGNATURE_________________________________  NURSE SIGNATURE__________________________________  ________________________________________________________________________   Adam Phenix  An incentive spirometer is a tool that can help keep your lungs clear and active. This tool measures how well you are filling your lungs with each breath. Taking long deep breaths may help reverse or decrease the chance of developing breathing  (pulmonary) problems (especially infection) following:  A long period of time when you are unable to move or be active. BEFORE THE PROCEDURE   If the spirometer includes an indicator to show your best effort, your nurse or respiratory therapist will set it to a desired goal.  If possible, sit up straight or lean slightly forward. Try not to slouch.  Hold the incentive spirometer in an upright position. INSTRUCTIONS FOR USE  1. Sit on the edge of your bed if possible, or sit up as far as you can in bed or on a chair. 2. Hold the incentive spirometer in an upright position. 3. Breathe out normally. 4. Place the mouthpiece in your mouth and seal your lips tightly around it. 5. Breathe in slowly and as deeply as possible, raising the piston or the ball toward the top of the column. 6. Hold your breath for 3-5 seconds or for as long as possible. Allow the piston or ball to fall to the bottom of the column. 7. Remove the mouthpiece from your mouth and breathe out normally. 8. Rest for a few seconds and repeat Steps 1 through 7 at least 10 times every 1-2 hours when you are awake. Take your time and take a few normal breaths between deep breaths. 9. The spirometer may include an indicator to show your best effort. Use the indicator as a goal to work toward during each repetition. 10. After each set of 10 deep breaths, practice coughing to be sure your lungs are clear. If you have an incision (the cut made at the time of surgery), support your incision when coughing by placing a pillow or rolled up towels  firmly against it. Once you are able to get out of bed, walk around indoors and cough well. You may stop using the incentive spirometer when instructed by your caregiver.  RISKS AND COMPLICATIONS  Take your time so you do not get dizzy or light-headed.  If you are in pain, you may need to take or ask for pain medication before doing incentive spirometry. It is harder to take a deep breath if you  are having pain. AFTER USE  Rest and breathe slowly and easily.  It can be helpful to keep track of a log of your progress. Your caregiver can provide you with a simple table to help with this. If you are using the spirometer at home, follow these instructions: [place] IF:   You are having difficultly using the spirometer.  You have trouble using the spirometer as often as instructed.  Your pain medication is not giving enough relief while using the spirometer.  You develop fever of 100.5 F (38.1 C) or higher. SEEK IMMEDIATE MEDICAL CARE IF:   You cough up bloody sputum that had not been present before.  You develop fever of 102 F (38.9 C) or greater.  You develop worsening pain at or near the incision site. MAKE SURE YOU:   Understand these instructions.  Will watch your condition.  Will get help right away if you are not doing well or get worse. Document Released: 06/15/2006 Document Revised: 04/27/2011 Document Reviewed: 08/16/2006 ExitCare Patient Information 2014 ExitCare, [place].   ________________________________________________________________________  WHAT IS A BLOOD TRANSFUSION? Blood Transfusion Information  A transfusion is the replacement of blood or some of its parts. Blood is made up of multiple cells which provide different functions.  Red blood cells carry oxygen and are used for blood loss replacement.  White blood cells fight against infection.  Platelets control bleeding.  Plasma helps clot blood.  Other blood products are available for specialized needs, such as hemophilia or other clotting disorders. BEFORE THE TRANSFUSION  Who gives blood for transfusions?   Healthy volunteers who are fully evaluated to make sure their blood is safe. This is blood bank blood. Transfusion therapy is the safest it has ever been in the practice of medicine. Before blood is taken from a donor, a complete history is taken to make sure that person has  no history of diseases nor engages in risky social behavior (examples are intravenous drug use or sexual activity with multiple partners). The donor's travel history is screened to minimize risk of transmitting infections, such as malaria. The donated blood is tested for signs of infectious diseases, such as HIV and hepatitis. The blood is then tested to be sure it is compatible with you in order to minimize the chance of a transfusion reaction. If you or a relative donates blood, this is often done in anticipation of surgery and is not appropriate for emergency situations. It takes many days to process the donated blood. RISKS AND COMPLICATIONS Although transfusion therapy is very safe and saves many lives, the main dangers of transfusion include:   Getting an infectious disease.  Developing a transfusion reaction. This is an allergic reaction to something in the blood you were given. Every precaution is taken to prevent this. The decision to have a blood transfusion has been considered carefully by your caregiver before blood is given. Blood is not given unless the benefits outweigh the risks. AFTER THE TRANSFUSION  Right after receiving a blood transfusion, you will usually feel much better  and more energetic. This is especially true if your red blood cells have gotten low (anemic). The transfusion raises the level of the red blood cells which carry oxygen, and this usually causes an energy increase.  The nurse administering the transfusion will monitor you carefully for complications. HOME CARE INSTRUCTIONS  No special instructions are needed after a transfusion. You may find your energy is better. Speak with your caregiver about any limitations on activity for underlying diseases you may have. SEEK MEDICAL CARE IF:   Your condition is not improving after your transfusion.  You develop redness or irritation at the intravenous (IV) site. SEEK IMMEDIATE MEDICAL CARE IF:  Any of the following  symptoms occur over the next 12 hours:  Shaking chills.  You have a temperature by mouth above 102 F (38.9 C), not controlled by medicine.  Chest, back, or muscle pain.  People around you feel you are not acting correctly or are confused.  Shortness of breath or difficulty breathing.  Dizziness and fainting.  You get a rash or develop hives.  You have a decrease in urine output.  Your urine turns a dark color or changes to pink, red, or brown. Any of the following symptoms occur over the next 10 days:  You have a temperature by mouth above 102 F (38.9 C), not controlled by medicine.  Shortness of breath.  Weakness after normal activity.  The white part of the eye turns yellow (jaundice).  You have a decrease in the amount of urine or are urinating less often.  Your urine turns a dark color or changes to pink, red, or brown. Document Released: 01/31/2000 Document Revised: 04/27/2011 Document Reviewed: 09/19/2007 ExitCare Patient Information 2014 ExitCare, [place].  _______________________________________________________________________   CLEAR LIQUID DIET   Foods Allowed                                                                     Foods Excluded  Coffee and tea, regular and decaf                             liquids that you cannot  Plain Jell-O in any flavor                                             see through such as: Fruit ices (not with fruit pulp)                                     milk, soups, orange juice  Iced Popsicles                                    All solid food Carbonated beverages, regular and diet                                    Cranberry, grape and apple juices  Sports drinks like Gatorade Lightly seasoned clear broth or consume(fat free) Sugar, honey syrup  Sample Menu Breakfast                                Lunch                                     Supper Cranberry juice                    Beef broth                             Chicken broth Jell-O                                     Grape juice                           Apple juice Coffee or tea                        Jell-O                                      Popsicle                                                Coffee or tea                        Coffee or tea  _____________________________________________________________________

## 2017-04-02 NOTE — Progress Notes (Signed)
Final EKG dated 04-01-2017 in epic.   Positive PCR screen routed to dr Charlann Boxerolin in epic.

## 2017-04-05 ENCOUNTER — Ambulatory Visit (INDEPENDENT_AMBULATORY_CARE_PROVIDER_SITE_OTHER): Payer: Medicare Other | Admitting: Cardiovascular Disease

## 2017-04-05 ENCOUNTER — Encounter: Payer: Self-pay | Admitting: Cardiovascular Disease

## 2017-04-05 VITALS — BP 132/84 | HR 75 | Ht 61.0 in | Wt 197.0 lb

## 2017-04-05 DIAGNOSIS — E7800 Pure hypercholesterolemia, unspecified: Secondary | ICD-10-CM | POA: Diagnosis not present

## 2017-04-05 DIAGNOSIS — I10 Essential (primary) hypertension: Secondary | ICD-10-CM

## 2017-04-05 DIAGNOSIS — Z6837 Body mass index (BMI) 37.0-37.9, adult: Secondary | ICD-10-CM

## 2017-04-05 DIAGNOSIS — I350 Nonrheumatic aortic (valve) stenosis: Secondary | ICD-10-CM | POA: Diagnosis not present

## 2017-04-05 DIAGNOSIS — I2510 Atherosclerotic heart disease of native coronary artery without angina pectoris: Secondary | ICD-10-CM

## 2017-04-05 DIAGNOSIS — Z01810 Encounter for preprocedural cardiovascular examination: Secondary | ICD-10-CM

## 2017-04-05 DIAGNOSIS — Z86711 Personal history of pulmonary embolism: Secondary | ICD-10-CM | POA: Diagnosis not present

## 2017-04-05 NOTE — Patient Instructions (Signed)
Dr Croitoru recommends that you schedule a follow-up appointment in 12 months. You will receive a reminder letter in the mail two months in advance. If you don't receive a letter, please call our office to schedule the follow-up appointment.  If you need a refill on your cardiac medications before your next appointment, please call your pharmacy. 

## 2017-04-05 NOTE — Progress Notes (Signed)
Pt has cardiac clearance from dr croitoru in office note dated 04-05-2017  in epic.

## 2017-04-05 NOTE — Progress Notes (Signed)
Cardiology Consultation Note    Date:  04/05/2017   ID:  Hailey Shields, DOB [birth year], MRN [id number]  PCP:  Hailey Cordial, MD  Cardiologist:   Thurmon Fair, MD   Chief Complaint  Patient presents with  . Surgical clearance    Right total knee replacement  pt states she is unsteady because of her knee; denies CP, SOB    History of Present Illness:  Hailey Shields is a 72 y.o. female with a history of previous DVT/PE following left knee replacement surgery.  She is scheduled for right knee replacement surgery tomorrow with Dr. Charlann Boxer.  Plan is already in place for her to take Eliquis for DVT prophylaxis after surgery.  She denies any problems with chest pain, shortness of breath, claudication, palpitations, syncope or leg edema.  Since I last saw Hailey Shields, she has become a widow.  Unfortunately her husband passed from rapidly progressive supra nuclear pseudobulbar palsy.  The one positive result of her husband's illness was the fact that Hailey Shields has changed her diet while she was also trying to adapt to his dietary requirements.  She has managed to lose almost 25 pounds in the last year.  Echo a year ago showed normal right and left ventricular systolic function (completely recovered right ventricular function after her episode of pulmonary embolism July 2017) and also showed mild aortic valve stenosis.  Her chest CT did show evidence of coronary artery calcifications.  She has chronically elevated cholesterol. She has tried both atorvastatin or rosuvastatin which both made her joints ache. When she stopped these medications she felt better within a short period of time.  Has had numerous previous surgeries including tonsillectomy, umbilical hernia, cholecystectomy, total hysterectomy for ovarian cancer in 2003, R inguinal hernia 2008 and 2016, lumbar spine fusion 2009. She had total knee replacement July 2017, which is when she had a pulmonary embolism.  Past Medical History:    Diagnosis Date  . Carpal tunnel syndrome of right wrist    effects thumb and first two fingers  . Heart murmur   . History of chemotherapy 2004  COMPLETED  . 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 History of exercise stress test 03-24-2016   dr Laila Myhre   per note Intermediate risk secondary to poor exercise effort, no ecg ischemia; needs nuclear stress test  . History of kidney stones   . History of ovarian cancer 2003--- per pt no recurrence   s/p  TAH w/ BSO and completed chemotherapy in 2004 , no radiation  . History of pulmonary embolus (PE) 08/2015   post LEFT total knee arthroplasty  . History of transient ischemic attack (TIA) 2004   "just after chemotherapy"  . Hypertension   . Nonrheumatic aortic (valve) stenosis    per last echo 04-06-2016  mild stenosis w/ valve area 1.69cm^2  . OA (osteoarthritis)    RIGHT KNEE,  FINGERS , ELBOWS  . Right knee pain   . Wears glasses     Past Surgical History:  Procedure Laterality Date  . INSERTION OF MESH N/A 11/27/2014   Procedure: INSERTION OF MESH;  Surgeon: Darnell Level, MD;  Location: [facility];  Service: General;  Laterality: N/A;  . LAPAROTOMY N/A 11/27/2014   Procedure: EXPLORATORY LAPAROTOMY;  Surgeon: Darnell Level, MD;  Location: [facility];  Service: General;  Laterality: N/A;  . LUMBAR FUSION  2009  . TONSILLECTOMY  age 68  . TOTAL  ABDOMINAL HYSTERECTOMY W/ BILATERAL SALPINGOOPHORECTOMY  2003   AND CHOLECYSTECTOMY/  MULTIPLE LYMPH NODE DISSECTIONS  . TOTAL KNEE ARTHROPLASTY Left 08/27/2015   Procedure: LEFT TOTAL KNEE ARTHROPLASTY;  Surgeon: Durene RomansMatthew Olin, MD;  Location: [facility];  Service: Orthopedics;  Laterality: Left;  . TRANSTHORACIC ECHOCARDIOGRAM  04-06-2016  dr Wiley Magan   ef 55-60%,  grade 1 diastolic dysfunction/  mild AV stenosis (valve area 1.69cm^2)/ trivial MR/  mild LAE/  mild TR  . UMBILICAL HERNIA REPAIR  2001  APPROX.  Marland Kitchen. VENTRAL HERNIA REPAIR N/A 11/27/2014   Procedure:  HERNIA REPAIR VENTRAL ADULT;  Surgeon: Darnell Levelodd Gerkin, MD;  Location: [facility];  Service: General;  Laterality: N/A;  . VENTRAL HERNIA REPAIR  2008 APPROX.    Current Medications: Outpatient Medications Prior to Visit  Medication Sig Dispense Refill  . aspirin EC 325 MG tablet Take 325 mg by mouth daily.    . chlorpheniramine (ALLER-CHLOR) 4 MG tablet Take 4 mg by mouth daily.     Marland Kitchen. lisinopril-hydrochlorothiazide (PRINZIDE,ZESTORETIC) 20-12.5 MG per tablet Take 1 tablet by mouth daily.    . Multiple Vitamin (MULTIVITAMIN) tablet Take 1 tablet by mouth daily.    . sodium chloride (OCEAN) 0.65 % SOLN nasal spray Place 1 spray into both nostrils daily.      No facility-administered medications prior to visit.      Allergies:   Codeine; Hydrocodone; Oxycodone; Prednisone; Statins; Tramadol; and Zithromax [azithromycin]   Social History   Socioeconomic History  . Marital status: Widowed    Spouse name: None  . Number of children: None  . Years of education: None  . Highest education level: None  Social Needs  . Financial resource strain: None  . Food insecurity - worry: None  . Food insecurity - inability: None  . Transportation needs - medical: None  . Transportation needs - non-medical: None  Occupational History  . None  Tobacco Use  . Smoking status: Never Smoker  . Smokeless tobacco: Never Used  Substance and Sexual Activity  . Alcohol use: No  . Drug use: No  . Sexual activity: None  Other Topics Concern  . None  Social History Narrative  . None     Family History:  The patient's family history includes Heart attack in her brother.   ROS:   Please see the history of present illness.    ROS All other systems reviewed and are negative.   PHYSICAL EXAM:   VS:  BP 132/84 (BP Location: Left Arm, Patient Position: Sitting, Cuff Size: Large)   Pulse 75   Ht 5\' 1"  (1.549 m)   Wt 197 lb (89.4 kg)   BMI 37.22 kg/m     General: Alert, oriented x3, no distress,  obese Head: no evidence of trauma, PERRL, EOMI, no exophtalmos or lid lag, no myxedema, no xanthelasma; normal ears, nose and oropharynx Neck: normal jugular venous pulsations and no hepatojugular reflux; brisk carotid pulses without delay and no carotid bruits Chest: clear to auscultation, no signs of consolidation by percussion or palpation, normal fremitus, symmetrical and full respiratory excursions Cardiovascular: normal position and quality of the apical impulse, regular rhythm, normal first and second heart sounds, no diastolic murmurs, rubs or gallops; 2/6 early peaking systolic ejection murmur in the aortic focus Abdomen: no tenderness or distention, no masses by palpation, no abnormal pulsatility or arterial bruits, normal bowel sounds, no hepatosplenomegaly Extremities: no clubbing, cyanosis or edema; 2+ radial, ulnar and brachial pulses bilaterally; 2+ right femoral, posterior tibial and  dorsalis pedis pulses; 2+ left femoral, posterior tibial and dorsalis pedis pulses; no subclavian or femoral bruits Neurological: grossly nonfocal Psych: Normal mood and affect   Wt Readings from Last 3 Encounters:  04/05/17 197 lb (89.4 kg)  04/01/17 189 lb 8 oz (86 kg)  12/10/16 197 lb (89.4 kg)      Studies/Labs Reviewed:   EKG:  EKG is not ordered today.     Recent Labs: 04/01/2017: BUN 14; Creatinine, Ser 0.55; Hemoglobin 14.1; Platelets 301; Potassium 3.6; Sodium 139   Lipid Panel September 2016 total cholesterol 315, triglycerides 209, LDL 223, HDL 50  Lipid Panel     Component Value Date/Time   CHOL 280 (H) 09/23/2016 0806   TRIG 146 09/23/2016 0806   HDL 56 09/23/2016 0806   CHOLHDL 5.0 (H) 09/23/2016 0806   VLDL 29 09/23/2016 0806   LDLCALC 195 (H) 09/23/2016 0806     ASSESSMENT:    1. Preop cardiovascular exam   2. Personal history of PE (pulmonary embolism)   3. Nonrheumatic aortic valve stenosis   4. Hypercholesterolemia   5. Essential hypertension   6. Class 2  severe obesity due to excess calories with serious comorbidity and body mass index (BMI) of 37.0 to 37.9 in adult (HCC)   7. Coronary artery calcification seen on CT scan      PLAN:  In order of problems listed above:  1. Hx PE: She had a provoked acute pulmonary embolism related to previous left total knee replacement.  She should receive aggressive DVT/PE prophylaxis with her planned right total knee replacement tomorrow. 2. Preop risk evaluation: She has good functional status.  As long as we protect her from venous thromboembolic complications she should have a very low risk of other cardiovascular events with the planned knee replacement surgery 3. AS: Mild, asymptomatic, discussed the fact that this is a progressive disorder that might require surgical intervention in several years.  She is instructed to report exertional angina, dyspnea or syncope. 4. HLP: Her LDL cholesterol is still quite high and unlikely to improve without pharmacological therapy.  She has not had any personal cardiac or vascular events but does have a family history of CAD after she recovers from her knee surgery will discuss a more intense program of diet and exercise, but I suspect that eventually she may require treatment with PCSK9 inhibitors 5. HTN: Excellent control 6. Obesity: She is making great progress with weight loss.  Remains mildly obese.  Hopefully this will be associated with improvement in her lipid profile 7. Coronary calcification on CT: She does not have symptoms of coronary artery disease.  Once she fully recovers from knee surgery and rehab, consider a functional assessment with a treadmill stress test.  Medication Adjustments/Labs and Tests Ordered: Current medicines are reviewed at length with the patient today.  Concerns regarding medicines are outlined above.  Medication changes, Labs and Tests ordered today are listed in the Patient Instructions below. Patient Instructions  Dr Royann Shivers  recommends that you schedule a follow-up appointment in 12 months. You will receive a reminder letter in the mail two months in advance. If you don't receive a letter, please call our office to schedule the follow-up appointment.  If you need a refill on your cardiac medications before your next appointment, please call your pharmacy.    Signed, Thurmon Fair, MD  04/05/2017 11:28 AM    [facility] [address] [place], [place], [place]  [address] Phone: [phone]; Fax: [phone]   

## 2017-04-06 ENCOUNTER — Inpatient Hospital Stay (HOSPITAL_COMMUNITY)
Admission: RE | Admit: 2017-04-06 | Discharge: 2017-04-08 | DRG: 470 | Disposition: A | Discharge status: Home / Self Care | Payer: Medicare Other | Source: Ambulatory Visit | Attending: Orthopedic Surgery | Admitting: Orthopedic Surgery

## 2017-04-06 ENCOUNTER — Encounter (HOSPITAL_COMMUNITY)
Admission: RE | Disposition: A | Discharge status: Home / Self Care | Payer: Self-pay | Source: Ambulatory Visit | Attending: Orthopedic Surgery

## 2017-04-06 ENCOUNTER — Inpatient Hospital Stay (HOSPITAL_COMMUNITY): Payer: Medicare Other | Admitting: Anesthesiology

## 2017-04-06 ENCOUNTER — Other Ambulatory Visit: Payer: Self-pay

## 2017-04-06 ENCOUNTER — Encounter (HOSPITAL_COMMUNITY): Payer: Self-pay | Admitting: *Deleted

## 2017-04-06 DIAGNOSIS — M1711 Unilateral primary osteoarthritis, right knee: Principal | ICD-10-CM | POA: Diagnosis present

## 2017-04-06 DIAGNOSIS — Z7982 Long term (current) use of aspirin: Secondary | ICD-10-CM

## 2017-04-06 DIAGNOSIS — Z90710 Acquired absence of both cervix and uterus: Secondary | ICD-10-CM

## 2017-04-06 DIAGNOSIS — Z96659 Presence of unspecified artificial knee joint: Secondary | ICD-10-CM

## 2017-04-06 DIAGNOSIS — M19049 Primary osteoarthritis, unspecified hand: Secondary | ICD-10-CM | POA: Diagnosis present

## 2017-04-06 DIAGNOSIS — Z90722 Acquired absence of ovaries, bilateral: Secondary | ICD-10-CM

## 2017-04-06 DIAGNOSIS — Z86711 Personal history of pulmonary embolism: Secondary | ICD-10-CM

## 2017-04-06 DIAGNOSIS — Z8673 Personal history of transient ischemic attack (TIA), and cerebral infarction without residual deficits: Secondary | ICD-10-CM

## 2017-04-06 DIAGNOSIS — M19021 Primary osteoarthritis, right elbow: Secondary | ICD-10-CM | POA: Diagnosis present

## 2017-04-06 DIAGNOSIS — Z8543 Personal history of malignant neoplasm of ovary: Secondary | ICD-10-CM

## 2017-04-06 DIAGNOSIS — I2510 Atherosclerotic heart disease of native coronary artery without angina pectoris: Secondary | ICD-10-CM | POA: Diagnosis present

## 2017-04-06 DIAGNOSIS — Z881 Allergy status to other antibiotic agents status: Secondary | ICD-10-CM

## 2017-04-06 DIAGNOSIS — I10 Essential (primary) hypertension: Secondary | ICD-10-CM | POA: Diagnosis present

## 2017-04-06 DIAGNOSIS — Z9221 Personal history of antineoplastic chemotherapy: Secondary | ICD-10-CM

## 2017-04-06 DIAGNOSIS — Z888 Allergy status to other drugs, medicaments and biological substances status: Secondary | ICD-10-CM

## 2017-04-06 DIAGNOSIS — M19022 Primary osteoarthritis, left elbow: Secondary | ICD-10-CM | POA: Diagnosis present

## 2017-04-06 DIAGNOSIS — Z6836 Body mass index (BMI) 36.0-36.9, adult: Secondary | ICD-10-CM

## 2017-04-06 DIAGNOSIS — Z96651 Presence of right artificial knee joint: Secondary | ICD-10-CM

## 2017-04-06 DIAGNOSIS — E782 Mixed hyperlipidemia: Secondary | ICD-10-CM | POA: Diagnosis present

## 2017-04-06 DIAGNOSIS — Z79899 Other long term (current) drug therapy: Secondary | ICD-10-CM

## 2017-04-06 DIAGNOSIS — Z9049 Acquired absence of other specified parts of digestive tract: Secondary | ICD-10-CM

## 2017-04-06 DIAGNOSIS — Z86718 Personal history of other venous thrombosis and embolism: Secondary | ICD-10-CM

## 2017-04-06 DIAGNOSIS — M25761 Osteophyte, right knee: Secondary | ICD-10-CM | POA: Diagnosis present

## 2017-04-06 DIAGNOSIS — Z885 Allergy status to narcotic agent status: Secondary | ICD-10-CM

## 2017-04-06 DIAGNOSIS — E669 Obesity, unspecified: Secondary | ICD-10-CM | POA: Diagnosis present

## 2017-04-06 DIAGNOSIS — I350 Nonrheumatic aortic (valve) stenosis: Secondary | ICD-10-CM | POA: Diagnosis present

## 2017-04-06 DIAGNOSIS — Z96652 Presence of left artificial knee joint: Secondary | ICD-10-CM | POA: Diagnosis present

## 2017-04-06 DIAGNOSIS — Z8249 Family history of ischemic heart disease and other diseases of the circulatory system: Secondary | ICD-10-CM

## 2017-04-06 HISTORY — PX: TOTAL KNEE ARTHROPLASTY: SHX125

## 2017-04-06 LAB — TYPE AND SCREEN
ABO/RH(D): A POS
Antibody Screen: NEGATIVE

## 2017-04-06 SURGERY — ARTHROPLASTY, KNEE, TOTAL
Anesthesia: Regional | Site: Knee | Laterality: Right

## 2017-04-06 MED ORDER — FERROUS SULFATE 325 (65 FE) MG PO TABS: 325.0000 mg | ORAL_TABLET | Freq: Three times a day (TID) | ORAL | 3 refills | Status: DC

## 2017-04-06 MED ORDER — KETOROLAC TROMETHAMINE 30 MG/ML IJ SOLN
INTRAMUSCULAR | Status: AC
  Filled 2017-04-06: qty 1

## 2017-04-06 MED ORDER — MENTHOL 3 MG MT LOZG: 1.0000 | LOZENGE | OROMUCOSAL | Status: DC | PRN

## 2017-04-06 MED ORDER — SODIUM CHLORIDE 0.9 % IJ SOLN
INTRAMUSCULAR | Status: AC
  Filled 2017-04-06: qty 50

## 2017-04-06 MED ORDER — ACETAMINOPHEN 325 MG PO TABS
325.0000 mg | ORAL_TABLET | ORAL | Status: DC | PRN
  Administered 2017-04-06 – 2017-04-08 (×4): 325 mg via ORAL
  Filled 2017-04-06 (×4): qty 1

## 2017-04-06 MED ORDER — EPHEDRINE 5 MG/ML INJ
INTRAVENOUS | Status: AC
  Filled 2017-04-06: qty 10

## 2017-04-06 MED ORDER — MAGNESIUM CITRATE PO SOLN: 1.0000 | Freq: Once | ORAL | Status: DC | PRN

## 2017-04-06 MED ORDER — PROPOFOL 10 MG/ML IV BOLUS
INTRAVENOUS | Status: DC | PRN
  Administered 2017-04-06: 120 mg via INTRAVENOUS

## 2017-04-06 MED ORDER — HYDROMORPHONE HCL 1 MG/ML IJ SOLN
INTRAMUSCULAR | Status: AC
  Administered 2017-04-06: 0.5 mg via INTRAVENOUS
  Filled 2017-04-06: qty 1

## 2017-04-06 MED ORDER — SUFENTANIL CITRATE 50 MCG/ML IV SOLN
INTRAVENOUS | Status: AC
Start: 2017-04-06 — End: 2017-04-06
  Filled 2017-04-06: qty 1

## 2017-04-06 MED ORDER — CHLORHEXIDINE GLUCONATE 4 % EX LIQD: 60.0000 mL | Freq: Once | CUTANEOUS | Status: DC

## 2017-04-06 MED ORDER — SODIUM CHLORIDE 0.9 % IJ SOLN
INTRAMUSCULAR | Status: AC
  Filled 2017-04-06: qty 10

## 2017-04-06 MED ORDER — ROPIVACAINE HCL 7.5 MG/ML IJ SOLN
INTRAMUSCULAR | Status: DC | PRN
  Administered 2017-04-06: 20 mL via PERINEURAL

## 2017-04-06 MED ORDER — ACETAMINOPHEN 650 MG RE SUPP: 650.0000 mg | RECTAL | Status: DC | PRN

## 2017-04-06 MED ORDER — ONDANSETRON HCL 4 MG PO TABS
4.0000 mg | ORAL_TABLET | Freq: Three times a day (TID) | ORAL | Status: DC | PRN
  Filled 2017-04-06: qty 1

## 2017-04-06 MED ORDER — KETAMINE HCL 10 MG/ML IJ SOLN
INTRAMUSCULAR | Status: AC
  Filled 2017-04-06: qty 1

## 2017-04-06 MED ORDER — BISACODYL 10 MG RE SUPP: 10.0000 mg | Freq: Every day | RECTAL | Status: DC | PRN

## 2017-04-06 MED ORDER — HYDROMORPHONE HCL 1 MG/ML IJ SOLN
0.5000 mg | INTRAMUSCULAR | Status: DC | PRN
  Administered 2017-04-06 (×2): 0.5 mg via INTRAVENOUS
  Administered 2017-04-07 (×3): 1 mg via INTRAVENOUS
  Filled 2017-04-06 (×4): qty 1

## 2017-04-06 MED ORDER — SODIUM CHLORIDE 0.9 % IJ SOLN
INTRAMUSCULAR | Status: DC | PRN
  Administered 2017-04-06: 30 mL

## 2017-04-06 MED ORDER — LACTATED RINGERS IV SOLN
INTRAVENOUS | Status: DC | PRN
  Administered 2017-04-06 (×2): via INTRAVENOUS

## 2017-04-06 MED ORDER — HYDROMORPHONE HCL 2 MG/ML IJ SOLN
INTRAMUSCULAR | Status: AC
Start: 2017-04-06 — End: 2017-04-06
  Filled 2017-04-06: qty 1

## 2017-04-06 MED ORDER — HYDROMORPHONE HCL 1 MG/ML IJ SOLN
INTRAMUSCULAR | Status: DC | PRN
  Administered 2017-04-06: .4 mg via INTRAVENOUS

## 2017-04-06 MED ORDER — DOCUSATE SODIUM 100 MG PO CAPS
100.0000 mg | ORAL_CAPSULE | Freq: Two times a day (BID) | ORAL | Status: DC
  Administered 2017-04-06 – 2017-04-08 (×3): 100 mg via ORAL
  Filled 2017-04-06 (×3): qty 1

## 2017-04-06 MED ORDER — FENTANYL CITRATE (PF) 100 MCG/2ML IJ SOLN
50.0000 ug | INTRAMUSCULAR | Status: DC
  Filled 2017-04-06: qty 2

## 2017-04-06 MED ORDER — SODIUM CHLORIDE 0.9 % IV SOLN
INTRAVENOUS | Status: DC
  Administered 2017-04-06 – 2017-04-07 (×3): via INTRAVENOUS

## 2017-04-06 MED ORDER — KETOROLAC TROMETHAMINE 30 MG/ML IJ SOLN
INTRAMUSCULAR | Status: DC | PRN
  Administered 2017-04-06: 30 mg via INTRAMUSCULAR

## 2017-04-06 MED ORDER — FENTANYL CITRATE (PF) 100 MCG/2ML IJ SOLN
25.0000 ug | INTRAMUSCULAR | Status: DC | PRN
  Administered 2017-04-06: 50 ug via INTRAVENOUS
  Administered 2017-04-06 (×2): 25 ug via INTRAVENOUS

## 2017-04-06 MED ORDER — 0.9 % SODIUM CHLORIDE (POUR BTL) OPTIME
TOPICAL | Status: DC | PRN
  Administered 2017-04-06: 1000 mL

## 2017-04-06 MED ORDER — POLYETHYLENE GLYCOL 3350 17 G PO PACK: 17.0000 g | PACK | Freq: Two times a day (BID) | ORAL | 0 refills | Status: DC

## 2017-04-06 MED ORDER — MIDAZOLAM HCL 2 MG/2ML IJ SOLN
1.0000 mg | INTRAMUSCULAR | Status: DC
  Filled 2017-04-06: qty 2

## 2017-04-06 MED ORDER — POLYETHYLENE GLYCOL 3350 17 G PO PACK
17.0000 g | PACK | Freq: Two times a day (BID) | ORAL | Status: DC
  Administered 2017-04-06 – 2017-04-08 (×3): 17 g via ORAL
  Filled 2017-04-06 (×3): qty 1

## 2017-04-06 MED ORDER — ONDANSETRON HCL 4 MG/2ML IJ SOLN
INTRAMUSCULAR | Status: AC
  Filled 2017-04-06: qty 2

## 2017-04-06 MED ORDER — SUFENTANIL CITRATE 50 MCG/ML IV SOLN
INTRAVENOUS | Status: DC | PRN
  Administered 2017-04-06: 10 ug via INTRAVENOUS
  Administered 2017-04-06: 20 ug via INTRAVENOUS
  Administered 2017-04-06 (×2): 10 ug via INTRAVENOUS

## 2017-04-06 MED ORDER — TRANEXAMIC ACID 1000 MG/10ML IV SOLN
INTRAVENOUS | Status: AC | PRN
  Administered 2017-04-06: 2000 mg via TOPICAL

## 2017-04-06 MED ORDER — BUPIVACAINE-EPINEPHRINE (PF) 0.25% -1:200000 IJ SOLN
INTRAMUSCULAR | Status: DC | PRN
  Administered 2017-04-06: 30 mL

## 2017-04-06 MED ORDER — PROPOFOL 10 MG/ML IV BOLUS
INTRAVENOUS | Status: AC
  Filled 2017-04-06: qty 20

## 2017-04-06 MED ORDER — CEFAZOLIN SODIUM-DEXTROSE 2-4 GM/100ML-% IV SOLN
2.0000 g | Freq: Four times a day (QID) | INTRAVENOUS | Status: AC
  Administered 2017-04-06 – 2017-04-07 (×2): 2 g via INTRAVENOUS
  Filled 2017-04-06 (×2): qty 100

## 2017-04-06 MED ORDER — SUGAMMADEX SODIUM 500 MG/5ML IV SOLN
INTRAVENOUS | Status: AC
  Filled 2017-04-06: qty 5

## 2017-04-06 MED ORDER — DIPHENHYDRAMINE HCL 12.5 MG/5ML PO ELIX: 12.5000 mg | ORAL_SOLUTION | ORAL | Status: DC | PRN

## 2017-04-06 MED ORDER — FERROUS SULFATE 325 (65 FE) MG PO TABS
325.0000 mg | ORAL_TABLET | Freq: Three times a day (TID) | ORAL | Status: DC
  Administered 2017-04-08 (×2): 325 mg via ORAL
  Filled 2017-04-06 (×2): qty 1

## 2017-04-06 MED ORDER — KETAMINE HCL 10 MG/ML IJ SOLN
INTRAMUSCULAR | Status: DC | PRN
  Administered 2017-04-06 (×2): 10 mg via INTRAVENOUS
  Administered 2017-04-06: 30 mg via INTRAVENOUS

## 2017-04-06 MED ORDER — RIVAROXABAN 10 MG PO TABS
10.0000 mg | ORAL_TABLET | ORAL | Status: DC
  Administered 2017-04-08: 10 mg via ORAL
  Filled 2017-04-06: qty 1

## 2017-04-06 MED ORDER — ROCURONIUM BROMIDE 10 MG/ML (PF) SYRINGE
PREFILLED_SYRINGE | INTRAVENOUS | Status: DC | PRN
  Administered 2017-04-06: 50 mg via INTRAVENOUS

## 2017-04-06 MED ORDER — BUPIVACAINE-EPINEPHRINE 0.25% -1:200000 IJ SOLN
INTRAMUSCULAR | Status: AC
  Filled 2017-04-06: qty 1

## 2017-04-06 MED ORDER — LIDOCAINE 2% (20 MG/ML) 5 ML SYRINGE
INTRAMUSCULAR | Status: DC | PRN
  Administered 2017-04-06: 60 mg via INTRAVENOUS

## 2017-04-06 MED ORDER — SODIUM CHLORIDE 0.9 % IR SOLN
Status: DC | PRN
  Administered 2017-04-06: 1000 mL

## 2017-04-06 MED ORDER — DOCUSATE SODIUM 100 MG PO CAPS: 100.0000 mg | ORAL_CAPSULE | Freq: Two times a day (BID) | ORAL | 0 refills | Status: DC

## 2017-04-06 MED ORDER — METHOCARBAMOL 500 MG PO TABS: 500.0000 mg | ORAL_TABLET | Freq: Four times a day (QID) | ORAL | 0 refills | Status: DC | PRN

## 2017-04-06 MED ORDER — PROMETHAZINE HCL 25 MG/ML IJ SOLN
6.2500 mg | Freq: Once | INTRAMUSCULAR | Status: AC | PRN
  Administered 2017-04-06: 6.25 mg via INTRAVENOUS

## 2017-04-06 MED ORDER — ONDANSETRON HCL 4 MG/2ML IJ SOLN
4.0000 mg | Freq: Three times a day (TID) | INTRAMUSCULAR | Status: DC | PRN
  Administered 2017-04-06 – 2017-04-07 (×2): 4 mg via INTRAVENOUS
  Filled 2017-04-06: qty 2

## 2017-04-06 MED ORDER — DEXAMETHASONE SODIUM PHOSPHATE 10 MG/ML IJ SOLN
10.0000 mg | Freq: Once | INTRAMUSCULAR | Status: DC
  Filled 2017-04-06: qty 1

## 2017-04-06 MED ORDER — CEFAZOLIN SODIUM-DEXTROSE 2-4 GM/100ML-% IV SOLN
2.0000 g | INTRAVENOUS | Status: AC
  Administered 2017-04-06: 2 g via INTRAVENOUS
  Filled 2017-04-06: qty 100

## 2017-04-06 MED ORDER — ONDANSETRON HCL 4 MG/2ML IJ SOLN
INTRAMUSCULAR | Status: DC | PRN
  Administered 2017-04-06: 4 mg via INTRAVENOUS

## 2017-04-06 MED ORDER — ASPIRIN 81 MG PO CHEW: 81.0000 mg | CHEWABLE_TABLET | Freq: Two times a day (BID) | ORAL | 0 refills | Status: DC

## 2017-04-06 MED ORDER — METHOCARBAMOL 500 MG PO TABS
500.0000 mg | ORAL_TABLET | Freq: Four times a day (QID) | ORAL | Status: DC | PRN
Start: 2017-04-06 — End: 2017-04-08
  Administered 2017-04-06 – 2017-04-08 (×4): 500 mg via ORAL
  Filled 2017-04-06 (×4): qty 1

## 2017-04-06 MED ORDER — TRANEXAMIC ACID 1000 MG/10ML IV SOLN
2000.0000 mg | Freq: Once | INTRAVENOUS | Status: DC
  Filled 2017-04-06: qty 20

## 2017-04-06 MED ORDER — PROMETHAZINE HCL 25 MG/ML IJ SOLN
INTRAMUSCULAR | Status: AC
  Administered 2017-04-06: 6.25 mg via INTRAVENOUS
  Filled 2017-04-06: qty 1

## 2017-04-06 MED ORDER — ALUM & MAG HYDROXIDE-SIMETH 200-200-20 MG/5ML PO SUSP: 15.0000 mL | ORAL | Status: DC | PRN

## 2017-04-06 MED ORDER — METOCLOPRAMIDE HCL 5 MG PO TABS
5.0000 mg | ORAL_TABLET | Freq: Three times a day (TID) | ORAL | Status: DC | PRN
  Filled 2017-04-06: qty 2

## 2017-04-06 MED ORDER — METOCLOPRAMIDE HCL 5 MG/ML IJ SOLN
5.0000 mg | Freq: Three times a day (TID) | INTRAMUSCULAR | Status: DC | PRN
  Administered 2017-04-06 – 2017-04-07 (×2): 10 mg via INTRAVENOUS
  Filled 2017-04-06: qty 2

## 2017-04-06 MED ORDER — METHOCARBAMOL 1000 MG/10ML IJ SOLN
500.0000 mg | Freq: Four times a day (QID) | INTRAVENOUS | Status: DC | PRN
  Administered 2017-04-06: 500 mg via INTRAVENOUS
  Filled 2017-04-06: qty 550

## 2017-04-06 MED ORDER — CELECOXIB 200 MG PO CAPS
200.0000 mg | ORAL_CAPSULE | Freq: Two times a day (BID) | ORAL | Status: DC
  Administered 2017-04-06: 23:00:00 200 mg via ORAL
  Filled 2017-04-06: qty 1

## 2017-04-06 MED ORDER — EPHEDRINE SULFATE-NACL 50-0.9 MG/10ML-% IV SOSY
PREFILLED_SYRINGE | INTRAVENOUS | Status: DC | PRN
  Administered 2017-04-06 (×2): 5 mg via INTRAVENOUS
  Administered 2017-04-06: 10 mg via INTRAVENOUS

## 2017-04-06 MED ORDER — FENTANYL CITRATE (PF) 100 MCG/2ML IJ SOLN
INTRAMUSCULAR | Status: AC
  Administered 2017-04-06: 25 ug via INTRAVENOUS
  Filled 2017-04-06: qty 2

## 2017-04-06 MED ORDER — LIDOCAINE 2% (20 MG/ML) 5 ML SYRINGE
INTRAMUSCULAR | Status: AC
  Filled 2017-04-06: qty 5

## 2017-04-06 MED ORDER — ACETAMINOPHEN 500 MG PO TABS: 1000.0000 mg | ORAL_TABLET | Freq: Three times a day (TID) | ORAL | 0 refills | Status: DC

## 2017-04-06 MED ORDER — SUGAMMADEX SODIUM 200 MG/2ML IV SOLN
INTRAVENOUS | Status: DC | PRN
  Administered 2017-04-06: 200 mg via INTRAVENOUS

## 2017-04-06 MED ORDER — PHENOL 1.4 % MT LIQD: 1.0000 | OROMUCOSAL | Status: DC | PRN

## 2017-04-06 MED ORDER — METOCLOPRAMIDE HCL 5 MG/ML IJ SOLN
INTRAMUSCULAR | Status: AC
  Administered 2017-04-06: 10 mg via INTRAVENOUS
  Filled 2017-04-06: qty 2

## 2017-04-06 SURGICAL SUPPLY — 47 items
ADH SKN CLS APL DERMABOND .7 (GAUZE/BANDAGES/DRESSINGS) ×1
BAG DECANTER FOR FLEXI CONT (MISCELLANEOUS) ×1 IMPLANT
BAG SPEC THK2 15X12 ZIP CLS (MISCELLANEOUS)
BAG ZIPLOCK 12X15 (MISCELLANEOUS) IMPLANT
BANDAGE ACE 6X5 VEL STRL LF (GAUZE/BANDAGES/DRESSINGS) ×2 IMPLANT
BLADE SAW SGTL 11.0X1.19X90.0M (BLADE) IMPLANT
BLADE SAW SGTL 13.0X1.19X90.0M (BLADE) ×2 IMPLANT
BOWL SMART MIX CTS (DISPOSABLE) ×2 IMPLANT
CAPT KNEE TOTAL 3 ATTUNE ×1 IMPLANT
CEMENT HV SMART SET (Cement) ×2 IMPLANT
COVER SURGICAL LIGHT HANDLE (MISCELLANEOUS) ×2 IMPLANT
CUFF TOURN SGL QUICK 34 (TOURNIQUET CUFF) ×2
CUFF TRNQT CYL 34X4X40X1 (TOURNIQUET CUFF) ×1 IMPLANT
DECANTER SPIKE VIAL GLASS SM (MISCELLANEOUS) ×2 IMPLANT
DERMABOND ADVANCED (GAUZE/BANDAGES/DRESSINGS) ×1
DERMABOND ADVANCED .7 DNX12 (GAUZE/BANDAGES/DRESSINGS) ×1 IMPLANT
DRAPE U-SHAPE 47X51 STRL (DRAPES) ×2 IMPLANT
DRESSING AQUACEL AG SP 3.5X10 (GAUZE/BANDAGES/DRESSINGS) ×1 IMPLANT
DRSG AQUACEL AG SP 3.5X10 (GAUZE/BANDAGES/DRESSINGS) ×2
DURAPREP 26ML APPLICATOR (WOUND CARE) ×4 IMPLANT
ELECT REM PT RETURN 15FT ADLT (MISCELLANEOUS) ×2 IMPLANT
GLOVE BIOGEL M 7.0 STRL (GLOVE) IMPLANT
GLOVE BIOGEL PI IND STRL 7.5 (GLOVE) ×1 IMPLANT
GLOVE BIOGEL PI IND STRL 8.5 (GLOVE) ×1 IMPLANT
GLOVE BIOGEL PI INDICATOR 7.5 (GLOVE) ×1
GLOVE BIOGEL PI INDICATOR 8.5 (GLOVE) ×1
GLOVE ECLIPSE 8.0 STRL XLNG CF (GLOVE) ×3 IMPLANT
GLOVE ORTHO TXT STRL SZ7.5 (GLOVE) ×4 IMPLANT
GOWN STRL REUS W/TWL LRG LVL3 (GOWN DISPOSABLE) ×2 IMPLANT
GOWN STRL REUS W/TWL XL LVL3 (GOWN DISPOSABLE) ×2 IMPLANT
HANDPIECE INTERPULSE COAX TIP (DISPOSABLE) ×2
MANIFOLD NEPTUNE II (INSTRUMENTS) ×2 IMPLANT
PACK TOTAL KNEE CUSTOM (KITS) ×2 IMPLANT
POSITIONER SURGICAL ARM (MISCELLANEOUS) ×2 IMPLANT
SET HNDPC FAN SPRY TIP SCT (DISPOSABLE) ×1 IMPLANT
SET PAD KNEE POSITIONER (MISCELLANEOUS) ×2 IMPLANT
SUT MNCRL AB 4-0 PS2 18 (SUTURE) ×2 IMPLANT
SUT STRATAFIX PDS+ 0 24IN (SUTURE) ×2 IMPLANT
SUT VIC AB 1 CT1 36 (SUTURE) ×2 IMPLANT
SUT VIC AB 2-0 CT1 27 (SUTURE) ×6
SUT VIC AB 2-0 CT1 TAPERPNT 27 (SUTURE) ×3 IMPLANT
SYR 50ML LL SCALE MARK (SYRINGE) ×2 IMPLANT
TRAY FOLEY CATH 14FRSI W/METER (CATHETERS) ×1 IMPLANT
TRAY FOLEY W/METER SILVER 16FR (SET/KITS/TRAYS/PACK) ×1 IMPLANT
WATER STERILE IRR 1000ML POUR (IV SOLUTION) ×3 IMPLANT
WRAP KNEE MAXI GEL POST OP (GAUZE/BANDAGES/DRESSINGS) ×2 IMPLANT
YANKAUER SUCT BULB TIP 10FT TU (MISCELLANEOUS) ×2 IMPLANT

## 2017-04-06 NOTE — Transfer of Care (Signed)
Immediate Anesthesia Transfer of Care Note  Patient: Hailey CopaJudy M Bonaventura  Procedure(s) Performed: RIGHT TOTAL KNEE ARTHROPLASTY (Right Knee)  Patient Location: PACU  Anesthesia Type:GA combined with regional for post-op pain  Level of Consciousness: awake  Airway & Oxygen Therapy: Patient Spontanous Breathing and Patient connected to face mask oxygen  Post-op Assessment: Report given to RN and Post -op Vital signs reviewed and stable  Post vital signs: Reviewed and stable  Last Vitals:  Vitals:   04/06/17 1029  BP: (!) 152/81  Pulse: 78  Resp: 18  Temp: 36.9 C  SpO2: 99%    Last Pain:  Vitals:   04/06/17 1029  TempSrc: Oral         Complications: No apparent anesthesia complications

## 2017-04-06 NOTE — Interval H&P Note (Signed)
History and Physical Interval Note:  04/06/2017 11:20 AM  Hailey Shields  has presented today for surgery, with the diagnosis of Right knee osteoarthritis  The various methods of treatment have been discussed with the patient and family. After consideration of risks, benefits and other options for treatment, the patient has consented to  Procedure(s) with comments: RIGHT TOTAL KNEE ARTHROPLASTY (Right) - 70 mins as a surgical intervention .  The patient's history has been reviewed, patient examined, no change in status, stable for surgery.  I have reviewed the patient's chart and labs.  Questions were answered to the patient's satisfaction.     Shelda PalMatthew D Lillyen Schow

## 2017-04-06 NOTE — Anesthesia Procedure Notes (Signed)
Anesthesia Regional Block: Adductor canal block   Pre-Anesthetic Checklist: ,, timeout performed, Correct Patient, Correct Site, Correct Laterality, Correct Procedure, Correct Position, site marked, Risks and benefits discussed,  Surgical consent,  Pre-op evaluation,  At surgeon's request and post-op pain management  Laterality: Lower and Right  Prep: chloraprep       Needles:  Injection technique: Single-shot  Needle Type: Echogenic Stimulator Needle          Additional Needles:   Narrative:  Start time: 04/06/2017 12:36 PM End time: 04/06/2017 12:41 PM Injection made incrementally with aspirations every 5 mL.  Performed by: Personally  Anesthesiologist: Val EagleMoser, Shawntell Dixson, MD  Additional Notes: H+P and labs reviewed, risks and benefits discussed with patient, procedure tolerated well without complications

## 2017-04-06 NOTE — Discharge Instructions (Addendum)

## 2017-04-06 NOTE — Anesthesia Procedure Notes (Signed)
Procedure Name: Intubation Date/Time: 04/06/2017 12:29 PM Performed by: Florene Routeeardon, Jax Kentner L, CRNA Pre-anesthesia Checklist: Patient identified Patient Re-evaluated:Patient Re-evaluated prior to induction Oxygen Delivery Method: Circle system utilized Preoxygenation: Pre-oxygenation with 100% oxygen Induction Type: IV induction Ventilation: Mask ventilation without difficulty and Oral airway inserted - appropriate to patient size Laryngoscope Size: Hyacinth MeekerMiller and 2 Grade View: Grade I Tube type: Oral Tube size: 7.5 mm Number of attempts: 1 Airway Equipment and Method: Stylet Placement Confirmation: ETT inserted through vocal cords under direct vision,  positive ETCO2 and breath sounds checked- equal and bilateral Secured at: 21 cm Tube secured with: Tape Dental Injury: Teeth and Oropharynx as per pre-operative assessment

## 2017-04-06 NOTE — Anesthesia Preprocedure Evaluation (Signed)
Anesthesia Evaluation  Patient identified by MRN, date of birth, ID band Patient awake    Reviewed: Allergy & Precautions, NPO status , Patient's Chart, lab work & pertinent test results  History of Anesthesia Complications Negative for: history of anesthetic complications  Airway Mallampati: II  TM Distance: >3 FB Neck ROM: Full    Dental  (+) Teeth Intact   Pulmonary neg pulmonary ROS,    breath sounds clear to auscultation       Cardiovascular hypertension, Pt. on medications (-) angina+ CAD  (-) Past MI and (-) CHF  Rhythm:Regular     Neuro/Psych  Neuromuscular disease    GI/Hepatic negative GI ROS, Neg liver ROS,   Endo/Other  Morbid obesity  Renal/GU negative Renal ROS     Musculoskeletal  (+) Arthritis ,   Abdominal   Peds  Hematology negative hematology ROS (+)   Anesthesia Other Findings   Reproductive/Obstetrics                             Anesthesia Physical Anesthesia Plan  ASA: II  Anesthesia Plan: General and Regional   Post-op Pain Management:  Regional for Post-op pain   Induction: Intravenous  PONV Risk Score and Plan: 3 and Dexamethasone and Ondansetron  Airway Management Planned: LMA and Oral ETT  Additional Equipment: None  Intra-op Plan:   Post-operative Plan: Extubation in OR  Informed Consent: I have reviewed the patients History and Physical, chart, labs and discussed the procedure including the risks, benefits and alternatives for the proposed anesthesia with the patient or authorized representative who has indicated his/her understanding and acceptance.   Dental advisory given  Plan Discussed with: CRNA and Surgeon  Anesthesia Plan Comments:         Anesthesia Quick Evaluation

## 2017-04-06 NOTE — Op Note (Signed)
NAME:  Hailey Shields                      MEDICAL RECORD NO.:  [id number]                             [facility]      PHYSICIAN:  Madlyn FrankelMatthew D. Charlann Boxerlin, M.D.  DATE OF BIRTH:  09/23/1945      DATE OF PROCEDURE:  04/06/2017                                     OPERATIVE REPORT         PREOPERATIVE DIAGNOSIS:  Right knee osteoarthritis.      POSTOPERATIVE DIAGNOSIS:  Right knee osteoarthritis.      FINDINGS:  The patient was noted to have complete loss of cartilage and   bone-on-bone arthritis with associated osteophytes in the lateral and patellofemoral compartments of   the knee with a significant synovitis and associated effusion.      PROCEDURE:  Right total knee replacement.      COMPONENTS USED:  DePuy Attune rotating platform posterior stabilized knee   system, a size 5N femur, 4 tibia, size 6 mm PS AOX insert, and 35 anatomic patellar   button.      SURGEON:  Madlyn FrankelMatthew D. Charlann Boxerlin, M.D.      ASSISTANT:  Lanney GinsMatthew Babish, PA-C.      ANESTHESIA:  General and Regional.      SPECIMENS:  None.      COMPLICATION:  None.      DRAINS:  None.  EBL: <100      TOURNIQUET TIME:   Total Tourniquet Time Documented: Thigh (Right) - 24 minutes Total: Thigh (Right) - 24 minutes  .      The patient was stable to the recovery room.      INDICATION FOR PROCEDURE:  Hailey Shields is a 72 y.o. female patient of   mine.  The patient had been seen, evaluated, and treated conservatively in the   office with medication, activity modification, and injections.  The patient had   radiographic changes of bone-on-bone arthritis with endplate sclerosis and osteophytes noted.      The patient failed conservative measures including medication, injections, and activity modification, and at this point was ready for more definitive measures.   Based on the radiographic changes and failed conservative measures, the patient   decided to proceed with total knee replacement.  Risks of infection,   DVT,  component failure, need for revision surgery, postop course, and   expectations were all   discussed and reviewed.  Consent was obtained for benefit of pain   relief.      PROCEDURE IN DETAIL:  The patient was brought to the operative theater.   Once adequate anesthesia, preoperative antibiotics, 2 gm of Ancef and 10 mg of Decadron administered, the patient was positioned supine with the right thigh tourniquet placed.  The  right lower extremity was prepped and draped in sterile fashion.  A time-   out was performed identifying the patient, planned procedure, and   extremity.      The right lower extremity was placed in the [facility] leg holder.  The leg was   exsanguinated, tourniquet elevated to 250 mmHg.  A midline incision was   made followed by median  parapatellar arthrotomy.  Following initial   exposure, attention was first directed to the patella.  Precut   measurement was noted to be 21 mm.  I resected down to 14 mm and used a   35 anatomic patellar button to restore patellar height as well as cover the cut   surface.      The lug holes were drilled and a metal shim was placed to protect the   patella from retractors and saw blades.      At this point, attention was now directed to the femur.  The femoral   canal was opened with a drill, irrigated to try to prevent fat emboli.  An   intramedullary rod was passed at 3 degrees valgus, 9 mm of bone was   resected off the distal femur.  Following this resection, the tibia was   subluxated anteriorly.  Using the extramedullary guide, 2 mm of bone was resected off   the proximal lateral tibia.  We confirmed the gap would be   stable medially and laterally with a size 5 spacer block as well as confirmed   the cut was perpendicular in the coronal plane, checking with an alignment rod.      Once this was done, I sized the femur to be a size 5 in the anterior-   posterior dimension, chose a narrow component based on medial and   lateral  dimension.  The size 5 rotation block was then pinned in   position anterior referenced using the C-clamp to set rotation.  The   anterior, posterior, and  chamfer cuts were made without difficulty nor   notching making certain that I was along the anterior cortex to help   with flexion gap stability.      The final box cut was made off the lateral aspect of distal femur.      At this point, the tibia was sized to be a size 4, the size 4 tray was   then pinned in position through the medial third of the tubercle,   drilled, and keel punched.  Trial reduction was now carried with a 5 femur,  4 tibia, a size 6 mm PS insert, and the 35 anatomic patella botton.  The knee was brought to   extension, full extension with good flexion stability with the patella   tracking through the trochlea without application of pressure.  Given   all these findings the femoral lug holes were drilled and then the trial components removed.  Final components were   opened and cement was mixed.  The knee was irrigated with normal saline   solution and pulse lavage.  The synovial lining was   then injected with 30 cc of 0.25% Marcaine with epinephrine and 1 cc of Toradol plus 30 cc of NS for a total of 61 cc.      The knee was irrigated.  Final implants were then cemented onto clean and   dried cut surfaces of bone with the knee brought to extension with a size 6 mm PS trial insert.      Once the cement had fully cured, the excess cement was removed   throughout the knee.  I confirmed I was satisfied with the range of   motion and stability, and the final size 6 mm PS AOX insert was chosen.  It was   placed into the knee.      The tourniquet had been let down at 24 minutes.  No  significant   hemostasis required.  The   extensor mechanism was then reapproximated using #1 Vicryl and #1 Stratafix sutures with the knee   in flexion.  The   remaining wound was closed with 2-0 Vicryl and running 4-0 Monocryl.   The  knee was cleaned, dried, dressed sterilely using Dermabond and   Aquacel dressing.  The patient was then   brought to recovery room in stable condition, tolerating the procedure   well.   Please note that Physician Assistant, Lanney Gins, PA-C, was present for the entirety of the case, and was utilized for pre-operative positioning, peri-operative retractor management, general facilitation of the procedure.  He was also utilized for primary wound closure at the end of the case.              Madlyn Frankel Charlann Boxer, M.D.    04/06/2017 1:39 PM

## 2017-04-07 ENCOUNTER — Encounter (HOSPITAL_COMMUNITY): Payer: Self-pay | Admitting: Orthopedic Surgery

## 2017-04-07 DIAGNOSIS — Z90722 Acquired absence of ovaries, bilateral: Secondary | ICD-10-CM | POA: Diagnosis not present

## 2017-04-07 DIAGNOSIS — Z90710 Acquired absence of both cervix and uterus: Secondary | ICD-10-CM | POA: Diagnosis not present

## 2017-04-07 DIAGNOSIS — Z8673 Personal history of transient ischemic attack (TIA), and cerebral infarction without residual deficits: Secondary | ICD-10-CM | POA: Diagnosis not present

## 2017-04-07 DIAGNOSIS — Z96652 Presence of left artificial knee joint: Secondary | ICD-10-CM | POA: Diagnosis present

## 2017-04-07 DIAGNOSIS — Z9049 Acquired absence of other specified parts of digestive tract: Secondary | ICD-10-CM | POA: Diagnosis not present

## 2017-04-07 DIAGNOSIS — I350 Nonrheumatic aortic (valve) stenosis: Secondary | ICD-10-CM | POA: Diagnosis present

## 2017-04-07 DIAGNOSIS — Z86711 Personal history of pulmonary embolism: Secondary | ICD-10-CM | POA: Diagnosis not present

## 2017-04-07 DIAGNOSIS — E782 Mixed hyperlipidemia: Secondary | ICD-10-CM | POA: Diagnosis present

## 2017-04-07 DIAGNOSIS — Z9221 Personal history of antineoplastic chemotherapy: Secondary | ICD-10-CM | POA: Diagnosis not present

## 2017-04-07 DIAGNOSIS — I10 Essential (primary) hypertension: Secondary | ICD-10-CM | POA: Diagnosis present

## 2017-04-07 DIAGNOSIS — M19049 Primary osteoarthritis, unspecified hand: Secondary | ICD-10-CM | POA: Diagnosis present

## 2017-04-07 DIAGNOSIS — Z881 Allergy status to other antibiotic agents status: Secondary | ICD-10-CM | POA: Diagnosis not present

## 2017-04-07 DIAGNOSIS — Z79899 Other long term (current) drug therapy: Secondary | ICD-10-CM | POA: Diagnosis not present

## 2017-04-07 DIAGNOSIS — Z86718 Personal history of other venous thrombosis and embolism: Secondary | ICD-10-CM | POA: Diagnosis not present

## 2017-04-07 DIAGNOSIS — I2510 Atherosclerotic heart disease of native coronary artery without angina pectoris: Secondary | ICD-10-CM | POA: Diagnosis present

## 2017-04-07 DIAGNOSIS — M25761 Osteophyte, right knee: Secondary | ICD-10-CM | POA: Diagnosis present

## 2017-04-07 DIAGNOSIS — M19021 Primary osteoarthritis, right elbow: Secondary | ICD-10-CM | POA: Diagnosis present

## 2017-04-07 DIAGNOSIS — M1711 Unilateral primary osteoarthritis, right knee: Secondary | ICD-10-CM | POA: Diagnosis present

## 2017-04-07 DIAGNOSIS — Z7982 Long term (current) use of aspirin: Secondary | ICD-10-CM | POA: Diagnosis not present

## 2017-04-07 DIAGNOSIS — Z8543 Personal history of malignant neoplasm of ovary: Secondary | ICD-10-CM | POA: Diagnosis not present

## 2017-04-07 DIAGNOSIS — M19022 Primary osteoarthritis, left elbow: Secondary | ICD-10-CM | POA: Diagnosis present

## 2017-04-07 DIAGNOSIS — Z8249 Family history of ischemic heart disease and other diseases of the circulatory system: Secondary | ICD-10-CM | POA: Diagnosis not present

## 2017-04-07 DIAGNOSIS — Z6836 Body mass index (BMI) 36.0-36.9, adult: Secondary | ICD-10-CM | POA: Diagnosis not present

## 2017-04-07 LAB — BASIC METABOLIC PANEL
ANION GAP: 7 (ref 5–15)
BUN: 14 mg/dL (ref 6–20)
CHLORIDE: 107 mmol/L (ref 101–111)
CO2: 23 mmol/L (ref 22–32)
Calcium: 8.4 mg/dL — ABNORMAL LOW (ref 8.9–10.3)
Creatinine, Ser: 0.42 mg/dL — ABNORMAL LOW (ref 0.44–1.00)
GFR calc non Af Amer: >60 mL/min (ref >60)
Glucose, Bld: 88 mg/dL (ref 65–99)
Potassium: 3.6 mmol/L (ref 3.5–5.1)
Sodium: 137 mmol/L (ref 135–145)

## 2017-04-07 LAB — CBC
HEMATOCRIT: 33.1 % — AB (ref 36.0–46.0)
HEMOGLOBIN: 11.1 g/dL — AB (ref 12.0–15.0)
MCH: 30.8 pg (ref 26.0–34.0)
MCHC: 33.5 g/dL (ref 30.0–36.0)
MCV: 91.9 fL (ref 78.0–100.0)
Platelets: 215 10*3/uL (ref 150–400)
RBC: 3.6 MIL/uL — ABNORMAL LOW (ref 3.87–5.11)
RDW: 12.7 % (ref 11.5–15.5)
WBC: 6.4 10*3/uL (ref 4.0–10.5)

## 2017-04-07 MED ORDER — PROMETHAZINE HCL 25 MG/ML IJ SOLN
6.2500 mg | Freq: Four times a day (QID) | INTRAMUSCULAR | Status: DC | PRN
  Administered 2017-04-07: 12.5 mg via INTRAVENOUS
  Filled 2017-04-07: qty 1

## 2017-04-07 MED ORDER — KETOROLAC TROMETHAMINE 15 MG/ML IJ SOLN: 15.0000 mg | Freq: Four times a day (QID) | INTRAMUSCULAR | Status: AC

## 2017-04-07 MED ORDER — FENTANYL CITRATE (PF) 100 MCG/2ML IJ SOLN: 25.0000 ug | INTRAMUSCULAR | Status: DC | PRN

## 2017-04-07 MED ORDER — KETOROLAC TROMETHAMINE 15 MG/ML IJ SOLN
15.0000 mg | Freq: Once | INTRAMUSCULAR | Status: AC
  Administered 2017-04-07: 15 mg via INTRAVENOUS
  Filled 2017-04-07: qty 1

## 2017-04-07 MED ORDER — KETOROLAC TROMETHAMINE 15 MG/ML IJ SOLN: 15.0000 mg | Freq: Four times a day (QID) | INTRAMUSCULAR | Status: DC

## 2017-04-07 MED ORDER — KETOROLAC TROMETHAMINE 15 MG/ML IJ SOLN
15.0000 mg | Freq: Four times a day (QID) | INTRAMUSCULAR | Status: AC
  Administered 2017-04-07 – 2017-04-08 (×2): 15 mg via INTRAVENOUS
  Filled 2017-04-07 (×2): qty 1

## 2017-04-07 NOTE — Progress Notes (Signed)
     Subjective: 1 Day Post-Op Procedure(s) (LRB): RIGHT TOTAL KNEE ARTHROPLASTY (Right)   Patient reports pain as mild.  She did receive one dose of Dilaudid IV and now has significant N&V. Discussed d/c'ing the IV Dilaudid and sticking with only APAP, per the patient.   Objective:   VITALS:   Vitals:   04/07/17 0203 04/07/17 0550  BP: (!) 121/57 127/61  Pulse: 82 69  Resp: 17 16  Temp: 98.6 F (37 C) 98.6 F (37 C)  SpO2: 100% 99%    Dorsiflexion/Plantar flexion intact Incision: dressing C/D/I No cellulitis present Compartment soft  LABS Recent Labs    04/07/17 0559  HGB 11.1*  HCT 33.1*  WBC 6.4  PLT 215    Recent Labs    04/07/17 0559  NA 137  K 3.6  BUN 14  CREATININE 0.42*  GLUCOSE 88     Assessment/Plan: 1 Day Post-Op Procedure(s) (LRB): RIGHT TOTAL KNEE ARTHROPLASTY (Right) Will d/c IV Dilaudid Add phenergan if needed, trying Zofran and Reglan first Foley cath d/c'ed Advance diet Up with therapy D/C IV fluids Discharge home eventually, when ready  Obese (BMI 30-39.9) Estimated body mass index is 36.84 kg/m as calculated from the following:   Height as of this encounter: 5\' 1"  (1.549 m).   Weight as of this encounter: 88.5 kg (195 lb). Patient also counseled that weight may inhibit the healing process Patient counseled that losing weight will help with future health issues      Anastasio AuerbachMatthew S. Leopoldo Mazzie   PAC  04/07/2017, 8:50 AM

## 2017-04-07 NOTE — Evaluation (Signed)
Physical Therapy Evaluation Patient Details Name: Hailey Shields MRN: [id number] DOB: Dec 06, 1945 Today's Date: 04/07/2017   History of Present Illness  72 yo female s/p R TKA 04/06/17. Hx of L TKA 08/2015  Clinical Impression  On eval, pt required Min assist for mobility. She was only able to tolerate a stand pivot, bed to recliner, this session. Activity limited by pain and nausea. Will progress activity as pt is able to tolerate. Plan is for VERA in home for therapy.    Follow Up Recommendations Follow surgeon's recommendation for DC plan and follow-up therapies    Equipment Recommendations  None recommended by PT    Recommendations for Other Services       Precautions / Restrictions Precautions Precautions: Fall Restrictions Weight Bearing Restrictions: No RLE Weight Bearing: Weight bearing as tolerated      Mobility  Bed Mobility Overal bed mobility: Needs Assistance Bed Mobility: Supine to Sit     Supine to sit: Min guard;HOB elevated     General bed mobility comments: close guard for safety. Increased time.   Transfers Overall transfer level: Needs assistance Equipment used: Rolling walker (2 wheeled) Transfers: Sit to/from [employer] CorporationStand;Stand Pivot Transfers Sit to Stand: Min assist;From elevated surface Stand pivot transfers: Min assist       General transfer comment: Assist to rise, stabilize, control descent. VCs safety, technique, hand/LE placement. Increased time. Stand pivot, bed to recliner.   Ambulation/Gait             General Gait Details: NT-due to pain, nausea  Stairs            Wheelchair Mobility    Modified Rankin (Stroke Patients Only)       Balance                                             Pertinent Vitals/Pain Pain Assessment: 0-10 Pain Score: 7  Pain Location: R knee Pain Descriptors / Indicators: Aching;Sore Pain Intervention(s): Limited activity within patient's tolerance;Repositioned    Home  Living Family/patient expects to be discharged to:: Private residence   Available Help at Discharge: Family;Available PRN/intermittently Type of Home: Mobile home Home Access: Stairs to enter Entrance Stairs-Rails: Right;Left;Can reach both Entrance Stairs-Number of Steps: 5 Home Layout: One level Home Equipment: Walker - 2 wheels;Bedside commode      Prior Function Level of Independence: Independent with assistive device(s)               Hand Dominance        Extremity/Trunk Assessment   Upper Extremity Assessment Upper Extremity Assessment: Defer to OT evaluation    Lower Extremity Assessment Lower Extremity Assessment: Generalized weakness(s/p R TKA)    Cervical / Trunk Assessment Cervical / Trunk Assessment: Normal  Communication   Communication: No difficulties  Cognition Arousal/Alertness: Awake/alert Behavior During Therapy: WFL for tasks assessed/performed Overall Cognitive Status: Within Functional Limits for tasks assessed                                        General Comments      Exercises Total Joint Exercises Ankle Circles/Pumps: AROM;Both;10 reps;Supine Quad Sets: AROM;Both;10 reps;Supine Hip ABduction/ADduction: AAROM;Right;10 reps;Supine   Assessment/Plan    PT Assessment Patient needs continued PT services  PT Problem List Decreased strength;Decreased  range of motion;Decreased balance;Decreased mobility;Pain;Decreased activity tolerance       PT Treatment Interventions DME instruction;Gait training;Functional mobility training;Therapeutic activities;Balance training;Patient/family education;Therapeutic exercise;Stair training    PT Goals (Current goals can be found in the Care Plan section)  Acute Rehab PT Goals Patient Stated Goal: to feel better. less pain.  PT Goal Formulation: With patient Time For Goal Achievement: 04/21/17 Potential to Achieve Goals: Good    Frequency 7X/week   Barriers to discharge         Co-evaluation               AM-PAC PT "6 Clicks" Daily Activity  Outcome Measure Difficulty turning over in bed (including adjusting bedclothes, sheets and blankets)?: A Little Difficulty moving from lying on back to sitting on the side of the bed? : A Little Difficulty sitting down on and standing up from a chair with arms (e.g., wheelchair, bedside commode, etc,.)?: Unable Help needed moving to and from a bed to chair (including a wheelchair)?: A Little Help needed walking in hospital room?: A Little Help needed climbing 3-5 steps with a railing? : A Lot 6 Click Score: 15    End of Session Equipment Utilized During Treatment: Gait belt Activity Tolerance: Patient limited by pain(limited by nausea) Patient left: in chair;with call bell/phone within reach   PT Visit Diagnosis: Difficulty in walking, not elsewhere classified (R26.2);Pain Pain - Right/Left: Right Pain - part of body: Knee    Time: 8657-8469 PT Time Calculation (min) (ACUTE ONLY): 21 min   Charges:   PT Evaluation $PT Eval Moderate Complexity: 1 Mod     PT G Codes:        Rebeca Alert, MPT Pager: [phone]

## 2017-04-07 NOTE — Progress Notes (Signed)
Spoke with Izetta DakinMatthew Babbish,PA, patient unable to tolerate IV dilaudid due to nausea/vomitting. Patient stating pain is a 10 out of 10, shaking and crying. Patient has a large allergy list but has never tried toradol or fentanyl. Patient unable to do OT/PT due to pain/nausea/vomitting. See new orders and to leave foley catheter in place today. Will reassess after toradol.

## 2017-04-07 NOTE — Progress Notes (Signed)
OT Cancellation Note  Patient Details Name: Hailey Shields MRN: [id number] DOB: 07-28-1945   Cancelled Treatment:    Reason Eval/Treat Not Completed: Other (comment).  Pt was nauseous with PT.  Will check on her tomorrow.  [name] 04/07/2017, 1:23 PM  Hailey Shields, OTR/L [phone] 04/07/2017

## 2017-04-07 NOTE — Progress Notes (Signed)
PT Cancellation Note  Patient Details Name: Hailey Shields MRN: [id number] DOB: 1945-12-25   Cancelled Treatment:    Reason Eval/Treat Not Completed: Pain limiting ability to participate. Will check back on tomorrow.    Rebeca AlertJannie Tamela Elsayed, MPT Pager: [phone]

## 2017-04-07 NOTE — Anesthesia Postprocedure Evaluation (Signed)
Anesthesia Post Note  Patient: Hailey Shields  Procedure(s) Performed: RIGHT TOTAL KNEE ARTHROPLASTY (Right Knee)     Patient location during evaluation: PACU Anesthesia Type: General and Regional Level of consciousness: awake and alert Pain management: pain level controlled Vital Signs Assessment: post-procedure vital signs reviewed and stable Respiratory status: spontaneous breathing, nonlabored ventilation, respiratory function stable and patient connected to nasal cannula oxygen Cardiovascular status: blood pressure returned to baseline and stable Postop Assessment: no apparent nausea or vomiting Anesthetic complications: no    Last Vitals:  Vitals:   04/07/17 1800 04/07/17 2003  BP: 138/72 (!) 132/59  Pulse: 79 71  Resp: 17 16  Temp: 37.2 C 37.1 C  SpO2: 100% 96%    Last Pain:  Vitals:   04/07/17 2003  TempSrc: Oral  PainSc:                  [name]

## 2017-04-08 LAB — CBC
HCT: 34.5 % — ABNORMAL LOW (ref 36.0–46.0)
HEMOGLOBIN: 11.4 g/dL — AB (ref 12.0–15.0)
MCH: 31 pg (ref 26.0–34.0)
MCHC: 33 g/dL (ref 30.0–36.0)
MCV: 93.8 fL (ref 78.0–100.0)
Platelets: 220 10*3/uL (ref 150–400)
RBC: 3.68 MIL/uL — ABNORMAL LOW (ref 3.87–5.11)
RDW: 12.9 % (ref 11.5–15.5)
WBC: 6.5 10*3/uL (ref 4.0–10.5)

## 2017-04-08 LAB — BASIC METABOLIC PANEL
Anion gap: 7 (ref 5–15)
BUN: 10 mg/dL (ref 6–20)
CALCIUM: 8.6 mg/dL — AB (ref 8.9–10.3)
CO2: 25 mmol/L (ref 22–32)
CREATININE: 0.46 mg/dL (ref 0.44–1.00)
Chloride: 108 mmol/L (ref 101–111)
GFR calc non Af Amer: >60 mL/min (ref >60)
Glucose, Bld: 123 mg/dL — ABNORMAL HIGH (ref 65–99)
Potassium: 3.7 mmol/L (ref 3.5–5.1)
SODIUM: 140 mmol/L (ref 135–145)

## 2017-04-08 MED ORDER — TRAMADOL HCL 50 MG PO TABS
50.0000 mg | ORAL_TABLET | Freq: Four times a day (QID) | ORAL | 0 refills | Status: DC | PRN
Start: 2017-04-08 — End: 2017-04-26

## 2017-04-08 MED ORDER — ONDANSETRON HCL 4 MG PO TABS: 4.0000 mg | ORAL_TABLET | Freq: Three times a day (TID) | ORAL | 0 refills | Status: DC | PRN

## 2017-04-08 NOTE — Progress Notes (Signed)
Physical Therapy Treatment Patient Details Name: Hailey Shields MRN: [id number] DOB: [birth year] Today's Date: 04/08/2017    History of Present Illness 72 yo female s/p R TKA 04/06/17. Hx of L TKA 08/2015    PT Comments    POD # 2 pm session Assisted to bathroom and pt voided. Assisted with amb a second time in hallway.  Pt has met goals to D/C to home.   Follow Up Recommendations  (VERA)     Equipment Recommendations  None recommended by PT    Recommendations for Other Services       Precautions / Restrictions Precautions Precautions: Fall Restrictions Weight Bearing Restrictions: No RLE Weight Bearing: Weight bearing as tolerated    Mobility  Bed Mobility               General bed mobility comments: OOB in recliner  Transfers Overall transfer level: Needs assistance Equipment used: Rolling walker (2 wheeled) Transfers: Sit to/from [employer] Sit to Stand: Min assist;From elevated surface;Min guard Stand pivot transfers: Min assist       General transfer comment: Assist to rise, stabilize, control descent. VCs safety, technique, hand/LE placement. Increased time. Stand pivot, bed to recliner.   Ambulation/Gait Ambulation/Gait assistance: Supervision;Min guard Ambulation Distance (Feet): 45 Feet Assistive device: Rolling walker (2 wheeled) Gait Pattern/deviations: Step-to pattern;Step-through pattern Gait velocity: decreased   General Gait Details: amb to bathroom then in hallway 25% VC's on safety with turns   No nausea today   Stairs Stairs: (has a ramp)          Wheelchair Mobility    Modified Rankin (Stroke Patients Only)       Balance                                            Cognition Arousal/Alertness: Awake/alert Behavior During Therapy: WFL for tasks assessed/performed Overall Cognitive Status: Within Functional Limits for tasks assessed                                         Exercises      General Comments        Pertinent Vitals/Pain Pain Assessment: 0-10 Pain Score: 5  Pain Location: R knee Pain Descriptors / Indicators: Aching;Sore Pain Intervention(s): Monitored during session;Repositioned;RN gave pain meds during session;Ice applied    Home Living                      Prior Function            PT Goals (current goals can now be found in the care plan section) Progress towards PT goals: Progressing toward goals    Frequency    7X/week      PT Plan Current plan remains appropriate    Co-evaluation              AM-PAC PT "6 Clicks" Daily Activity  Outcome Measure  Difficulty turning over in bed (including adjusting bedclothes, sheets and blankets)?: A Little Difficulty moving from lying on back to sitting on the side of the bed? : A Little Difficulty sitting down on and standing up from a chair with arms (e.g., wheelchair, bedside commode, etc,.)?: Unable Help needed moving to and from a bed to chair (including a wheelchair)?:  A Little Help needed walking in hospital room?: A Little Help needed climbing 3-5 steps with a railing? : A Lot 6 Click Score: 15    End of Session Equipment Utilized During Treatment: Gait belt Activity Tolerance: Patient limited by pain Patient left: in chair;with call bell/phone within reach   PT Visit Diagnosis: Difficulty in walking, not elsewhere classified (R26.2);Pain Pain - Right/Left: Right Pain - part of body: Knee     Time: 6546-5035 PT Time Calculation (min) (ACUTE ONLY): 9 min  Charges:  $Gait Training: 8-22 mins                    G Codes:       Rica Koyanagi  PTA [facility]  Acute  Rehab Pager      [phone]

## 2017-04-08 NOTE — Progress Notes (Signed)
     Subjective: 2 Days Post-Op Procedure(s) (LRB): RIGHT TOTAL KNEE ARTHROPLASTY (Right)   Patient reports pain as mild, pain controlled.  States that she is doing well and feels that she is ready to go home. States that Toradol makes her heart race and she doesn't want any more.  Feels that she has worked well with PT.  Ready to be discharged home if not complications.    Objective:   VITALS:   Vitals:   04/08/17 0513 04/08/17 0856  BP: 137/89 (!) 152/75  Pulse: 91 99  Resp: 16 20  Temp: 99 F (37.2 C) 98.8 F (37.1 C)  SpO2: 96% 97%    Dorsiflexion/Plantar flexion intact Incision: dressing C/D/I No cellulitis present Compartment soft  LABS Recent Labs    04/07/17 0559 04/08/17 0540  HGB 11.1* 11.4*  HCT 33.1* 34.5*  WBC 6.4 6.5  PLT 215 220    Recent Labs    04/07/17 0559 04/08/17 0540  NA 137 140  K 3.6 3.7  BUN 14 10  CREATININE 0.42* 0.46  GLUCOSE 88 123*     Assessment/Plan: 2 Days Post-Op Procedure(s) (LRB): RIGHT TOTAL KNEE ARTHROPLASTY (Right) Discussed sending home with tramadol and Zofran if needing more that APAP. Up with therapy Discharge home [facility]. Follow up with OLIN,Ellory Khurana D in 2 weeks.  Contact information:  [facility] [address] [place] [id number] [phone]        Anastasio AuerbachMatthew S. Allex Lapoint   PAC  04/08/2017, 12:43 PM

## 2017-04-08 NOTE — Discharge Summary (Signed)
Physician Discharge Summary  Patient ID: Hailey Shields MRN: [id number] DOB/AGE: [birth year] 72 y.o.  Admit date: 04/06/2017 Discharge date:  04/08/2017  Procedures:  Procedure(s) (LRB): RIGHT TOTAL KNEE ARTHROPLASTY (Right)  Attending Physician:  Dr. Durene Romans   Admission Diagnoses:   Right knee primary OA / pain  Discharge Diagnoses:  Principal Problem:   S/P right TKA Active Problems:   Obese   S/P total knee replacement  Past Medical History:  Diagnosis Date  . Carpal tunnel syndrome of right wrist    effects thumb and first two fingers  . Heart murmur   . History of chemotherapy 2004  COMPLETED  . 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 History of exercise stress test 03-24-2016   dr croitoru   per note Intermediate risk secondary to poor exercise effort, no ecg ischemia; needs nuclear stress test  . History of kidney stones   . History of ovarian cancer 2003--- per pt no recurrence   s/p  TAH w/ BSO and completed chemotherapy in 2004 , no radiation  . History of pulmonary embolus (PE) 08/2015   post LEFT total knee arthroplasty  . History of transient ischemic attack (TIA) 2004   "just after chemotherapy"  . Hypertension   . Nonrheumatic aortic (valve) stenosis    per last echo 04-06-2016  mild stenosis w/ valve area 1.69cm^2  . OA (osteoarthritis)    RIGHT KNEE,  FINGERS , ELBOWS  . Right knee pain   . Wears glasses     HPI:    Hailey Shields, 72 y.o. female, has a history of pain and functional disability in the right knee due to arthritis and has failed non-surgical conservative treatments for greater than 12 weeks to include NSAID's and/or analgesics, corticosteriod injections and activity modification.  Onset of symptoms was gradual, starting ~1 years ago with gradually worsening course since that time. The patient noted no past surgery on the right knee(s).  Patient currently rates pain in the right knee(s) at 10 out of 10  with activity. Patient has night pain, worsening of pain with activity and weight bearing, pain that interferes with activities of daily living, pain with passive range of motion, crepitus and joint swelling.  Patient has evidence of periarticular osteophytes and joint space narrowing by imaging studies.  There is no active infection.  Risks, benefits and expectations were discussed with the patient.  Risks including but not limited to the risk of anesthesia, blood clots, nerve damage, blood vessel damage, failure of the prosthesis, infection and up to and including death.  Patient understand the risks, benefits and expectations and wishes to proceed with surgery.   PCP: Olen Cordial, MD   Discharged Condition: good  Hospital Course:  Patient underwent the above stated procedure on 04/06/2017. Patient tolerated the procedure well and brought to the recovery room in good condition and subsequently to the floor.  POD #1 BP: 127/61 ; Pulse: 69 ; Temp: 98.6 F (37 C) ; Resp: 16 Patient reports pain as mild.  She did receive one dose of Dilaudid IV and now has significant N&V. Discussed d/c'ing the IV Dilaudid and sticking with only APAP, per the patient.  Dorsiflexion/plantar flexion intact, incision: dressing C/D/I, no cellulitis present and compartment soft.   LABS  Basename    HGB     11.1  HCT     33.1   POD #2  BP: 152/75 ; Pulse: 99 ;  Temp: 98.8 F (37.1 C) ; Resp: 20 Patient reports pain as mild, pain controlled.  States that she is doing well and feels that she is ready to go home. States that Toradol makes her heart race and she doesn't want any more.  Feels that she has worked well with PT.  Ready to be discharged home.   Dorsiflexion/plantar flexion intact, incision: dressing C/D/I, no cellulitis present and compartment soft.   LABS  Basename    HGB     11.4  HCT     34.5    Discharge Exam: General appearance: alert, cooperative and no distress Extremities: Homans sign is  negative, no sign of DVT, no edema, redness or tenderness in the calves or thighs and no ulcers, gangrene or trophic changes  Disposition: Home with follow up in 2 weeks   Follow-up Information    Durene Romanslin, Dariann Huckaba, MD. Schedule an appointment as soon as possible for a visit in 2 week(s).   Specialty:  Orthopedic Surgery Contact information: [address] [place] [id number] [phone]           Discharge Instructions    Call MD / Call 911   Complete by:  As directed    If you experience chest pain or shortness of breath, CALL 911 and be transported to the hospital emergency room.  If you develope a fever above 101 F, pus (white drainage) or increased drainage or redness at the wound, or calf pain, call your surgeon's office.   Change dressing   Complete by:  As directed    Maintain surgical dressing until follow up in the clinic. If the edges start to pull up, may reinforce with tape. If the dressing is no longer working, may remove and cover with gauze and tape, but must keep the area dry and clean.  Call with any questions or concerns.   Constipation Prevention   Complete by:  As directed    Drink plenty of fluids.  Prune juice may be helpful.  You may use a stool softener, such as Colace (over the counter) 100 mg twice a day.  Use MiraLax (over the counter) for constipation as needed.   Diet - low sodium heart healthy   Complete by:  As directed    Discharge instructions   Complete by:  As directed    Maintain surgical dressing until follow up in the clinic. If the edges start to pull up, may reinforce with tape. If the dressing is no longer working, may remove and cover with gauze and tape, but must keep the area dry and clean.  [facility]. Call with any questions or concerns.   Increase activity slowly as tolerated   Complete by:  As directed    Weight bearing as tolerated with assist device (walker, cane, etc) as directed,  use it as long as suggested by your surgeon or therapist, typically at least 4-6 weeks.   TED hose   Complete by:  As directed    Use stockings (TED hose) for 2 weeks on both leg(s).  You may remove them at night for sleeping.      Allergies as of 04/08/2017      Reactions   Codeine Other (See Comments)   Hallucinations   Hydrocodone Other (See Comments)   hallucinations   Oxycodone Other (See Comments)   hallucinations   Prednisone Other (See Comments)   Increase heart rate   Statins Other (See Comments)  Myalgia, weakness    Tramadol Nausea And Vomiting   Zithromax [azithromycin] Other (See Comments)   Increase heart rate      Medication List    STOP taking these medications   aspirin EC 325 MG tablet Replaced by:  aspirin 81 MG chewable tablet     TAKE these medications   acetaminophen 500 MG tablet Commonly known as:  TYLENOL Take 2 tablets (1,000 mg total) by mouth every 8 (eight) hours.   ALLER-CHLOR 4 MG tablet Generic drug:  chlorpheniramine Take 4 mg by mouth daily.   aspirin 81 MG chewable tablet Commonly known as:  ASPIRIN CHILDRENS Chew 1 tablet (81 mg total) by mouth 2 (two) times daily. Take for 4 weeks, then resume regular dose. Replaces:  aspirin EC 325 MG tablet   docusate sodium 100 MG capsule Commonly known as:  COLACE Take 1 capsule (100 mg total) by mouth 2 (two) times daily.   ferrous sulfate 325 (65 FE) MG tablet Commonly known as:  FERROUSUL Take 1 tablet (325 mg total) by mouth 3 (three) times daily with meals.   lisinopril-hydrochlorothiazide 20-12.5 MG tablet Commonly known as:  PRINZIDE,ZESTORETIC Take 1 tablet by mouth daily.   methocarbamol 500 MG tablet Commonly known as:  ROBAXIN Take 1 tablet (500 mg total) by mouth every 6 (six) hours as needed for muscle spasms.   multivitamin tablet Take 1 tablet by mouth daily.   ondansetron 4 MG tablet Commonly known as:  ZOFRAN Take 1 tablet (4 mg total) by mouth every 8 (eight)  hours as needed for nausea.   polyethylene glycol packet Commonly known as:  MIRALAX / GLYCOLAX Take 17 g by mouth 2 (two) times daily.   sodium chloride 0.65 % Soln nasal spray Commonly known as:  OCEAN Place 1 spray into both nostrils daily.   traMADol 50 MG tablet Commonly known as:  ULTRAM Take 1 tablet (50 mg total) by mouth every 6 (six) hours as needed.            Discharge Care Instructions  (From admission, onward)        Start     Ordered   04/07/17 0000  Change dressing    Comments:  Maintain surgical dressing until follow up in the clinic. If the edges start to pull up, may reinforce with tape. If the dressing is no longer working, may remove and cover with gauze and tape, but must keep the area dry and clean.  Call with any questions or concerns.   04/07/17 1610       Signed: Anastasio Auerbach. Durene Dodge   PA-C  04/08/2017, 12:55 PM

## 2017-04-08 NOTE — Progress Notes (Signed)
Physical Therapy Treatment Patient Details Name: Hailey Shields MRN: [id number] DOB: 20-Apr-1945 Today's Date: 04/08/2017    History of Present Illness 72 yo female s/p R TKA 04/06/17. Hx of L TKA 08/2015    PT Comments    POD # 2 am session Assisted to bathroom then in hallway.  No nausea today.  Returned to room to perform TKR TE's followed by ICE.   Follow Up Recommendations  (VERA)     Equipment Recommendations  None recommended by PT    Recommendations for Other Services       Precautions / Restrictions Precautions Precautions: Fall Restrictions Weight Bearing Restrictions: No RLE Weight Bearing: Weight bearing as tolerated    Mobility  Bed Mobility               General bed mobility comments: OOB in recliner  Transfers Overall transfer level: Needs assistance Equipment used: Rolling walker (2 wheeled) Transfers: Sit to/from [employer] CorporationStand;Stand Pivot Transfers Sit to Stand: Min assist;From elevated surface;Min guard Stand pivot transfers: Min assist       General transfer comment: Assist to rise, stabilize, control descent. VCs safety, technique, hand/LE placement. Increased time. Stand pivot, bed to recliner.   Ambulation/Gait Ambulation/Gait assistance: Supervision;Min guard Ambulation Distance (Feet): 25 Feet Assistive device: Rolling walker (2 wheeled) Gait Pattern/deviations: Step-to pattern;Step-through pattern Gait velocity: decreased   General Gait Details: amb to bathroom then in hallway 25% VC's on safety with turns   No nausea today   Stairs            Wheelchair Mobility    Modified Rankin (Stroke Patients Only)       Balance                                            Cognition Arousal/Alertness: Awake/alert Behavior During Therapy: WFL for tasks assessed/performed Overall Cognitive Status: Within Functional Limits for tasks assessed                                        Exercises   Total  Knee Replacement TE's 10 reps B LE ankle pumps 10 reps towel squeezes 10 reps knee presses 10 reps heel slides  10 reps SAQ's 10 reps SLR's 10 reps ABD Followed by ICE     General Comments        Pertinent Vitals/Pain Pain Assessment: 0-10 Pain Score: 5  Pain Location: R knee Pain Descriptors / Indicators: Aching;Sore Pain Intervention(s): Monitored during session;Repositioned;RN gave pain meds during session;Ice applied    Home Living                      Prior Function            PT Goals (current goals can now be found in the care plan section) Progress towards PT goals: Progressing toward goals    Frequency    7X/week      PT Plan Current plan remains appropriate    Co-evaluation              AM-PAC PT "6 Clicks" Daily Activity  Outcome Measure  Difficulty turning over in bed (including adjusting bedclothes, sheets and blankets)?: A Little Difficulty moving from lying on back to sitting on the side of the bed? : A  Little Difficulty sitting down on and standing up from a chair with arms (e.g., wheelchair, bedside commode, etc,.)?: Unable Help needed moving to and from a bed to chair (including a wheelchair)?: A Little Help needed walking in hospital room?: A Little Help needed climbing 3-5 steps with a railing? : A Lot 6 Click Score: 15    End of Session Equipment Utilized During Treatment: Gait belt Activity Tolerance: Patient limited by pain Patient left: in chair;with call bell/phone within reach   PT Visit Diagnosis: Difficulty in walking, not elsewhere classified (R26.2);Pain Pain - Right/Left: Right Pain - part of body: Knee     Time: 1010-1035 PT Time Calculation (min) (ACUTE ONLY): 25 min  Charges:  $Gait Training: 8-22 mins $Therapeutic Exercise: 8-22 mins                    G Codes:       Felecia Shelling  PTA [facility]  Acute  Rehab Pager      [phone]

## 2017-04-08 NOTE — Progress Notes (Signed)
OT Cancellation Note  Patient Details Name: Hailey Shields MRN: [id number] DOB: 1945/09/14   Cancelled Treatment:    Reason Eval/Treat Not Completed: OT screened, no needs identified, will sign off  Tannor Pyon 04/08/2017, 11:43 AM  Marica OtterMaryellen Compton Brigance, OTR/L [phone] 04/08/2017

## 2017-04-14 ENCOUNTER — Emergency Department: Payer: Medicare Other

## 2017-04-14 ENCOUNTER — Inpatient Hospital Stay
Admission: EM | Admit: 2017-04-14 | Discharge: 2017-04-16 | DRG: 247 | Disposition: A | Discharge status: Home / Self Care | Payer: Medicare Other | Attending: Internal Medicine | Admitting: Internal Medicine

## 2017-04-14 ENCOUNTER — Encounter
Admission: EM | Disposition: A | Discharge status: Home / Self Care | Payer: Self-pay | Source: Home / Self Care | Attending: Internal Medicine

## 2017-04-14 ENCOUNTER — Inpatient Hospital Stay (HOSPITAL_COMMUNITY)
Admit: 2017-04-14 | Discharge: 2017-04-14 | Disposition: A | Discharge status: Home / Self Care | Payer: Medicare Other | Attending: Internal Medicine | Admitting: Internal Medicine

## 2017-04-14 ENCOUNTER — Encounter: Payer: Self-pay | Admitting: *Deleted

## 2017-04-14 ENCOUNTER — Inpatient Hospital Stay: Payer: Medicare Other

## 2017-04-14 ENCOUNTER — Other Ambulatory Visit: Payer: Self-pay

## 2017-04-14 DIAGNOSIS — I10 Essential (primary) hypertension: Secondary | ICD-10-CM | POA: Diagnosis not present

## 2017-04-14 DIAGNOSIS — E876 Hypokalemia: Secondary | ICD-10-CM | POA: Diagnosis present

## 2017-04-14 DIAGNOSIS — Z888 Allergy status to other drugs, medicaments and biological substances status: Secondary | ICD-10-CM

## 2017-04-14 DIAGNOSIS — Z885 Allergy status to narcotic agent status: Secondary | ICD-10-CM | POA: Diagnosis not present

## 2017-04-14 DIAGNOSIS — Z90710 Acquired absence of both cervix and uterus: Secondary | ICD-10-CM | POA: Diagnosis not present

## 2017-04-14 DIAGNOSIS — I2119 ST elevation (STEMI) myocardial infarction involving other coronary artery of inferior wall: Secondary | ICD-10-CM | POA: Diagnosis present

## 2017-04-14 DIAGNOSIS — Z96651 Presence of right artificial knee joint: Secondary | ICD-10-CM | POA: Diagnosis not present

## 2017-04-14 DIAGNOSIS — Z90722 Acquired absence of ovaries, bilateral: Secondary | ICD-10-CM

## 2017-04-14 DIAGNOSIS — Z9221 Personal history of antineoplastic chemotherapy: Secondary | ICD-10-CM | POA: Diagnosis not present

## 2017-04-14 DIAGNOSIS — Z86711 Personal history of pulmonary embolism: Secondary | ICD-10-CM | POA: Diagnosis not present

## 2017-04-14 DIAGNOSIS — I340 Nonrheumatic mitral (valve) insufficiency: Secondary | ICD-10-CM

## 2017-04-14 DIAGNOSIS — Z981 Arthrodesis status: Secondary | ICD-10-CM

## 2017-04-14 DIAGNOSIS — I2510 Atherosclerotic heart disease of native coronary artery without angina pectoris: Secondary | ICD-10-CM

## 2017-04-14 DIAGNOSIS — Z8543 Personal history of malignant neoplasm of ovary: Secondary | ICD-10-CM | POA: Diagnosis not present

## 2017-04-14 DIAGNOSIS — Z8249 Family history of ischemic heart disease and other diseases of the circulatory system: Secondary | ICD-10-CM | POA: Diagnosis not present

## 2017-04-14 DIAGNOSIS — I213 ST elevation (STEMI) myocardial infarction of unspecified site: Secondary | ICD-10-CM | POA: Diagnosis not present

## 2017-04-14 DIAGNOSIS — I350 Nonrheumatic aortic (valve) stenosis: Secondary | ICD-10-CM | POA: Diagnosis present

## 2017-04-14 DIAGNOSIS — Z881 Allergy status to other antibiotic agents status: Secondary | ICD-10-CM

## 2017-04-14 DIAGNOSIS — E669 Obesity, unspecified: Secondary | ICD-10-CM | POA: Diagnosis present

## 2017-04-14 DIAGNOSIS — Z7982 Long term (current) use of aspirin: Secondary | ICD-10-CM | POA: Diagnosis not present

## 2017-04-14 DIAGNOSIS — Z8673 Personal history of transient ischemic attack (TIA), and cerebral infarction without residual deficits: Secondary | ICD-10-CM

## 2017-04-14 DIAGNOSIS — I255 Ischemic cardiomyopathy: Secondary | ICD-10-CM | POA: Diagnosis present

## 2017-04-14 DIAGNOSIS — I2111 ST elevation (STEMI) myocardial infarction involving right coronary artery: Secondary | ICD-10-CM | POA: Diagnosis not present

## 2017-04-14 DIAGNOSIS — Z96653 Presence of artificial knee joint, bilateral: Secondary | ICD-10-CM | POA: Diagnosis present

## 2017-04-14 DIAGNOSIS — I25110 Atherosclerotic heart disease of native coronary artery with unstable angina pectoris: Secondary | ICD-10-CM | POA: Diagnosis present

## 2017-04-14 DIAGNOSIS — M25561 Pain in right knee: Secondary | ICD-10-CM | POA: Diagnosis present

## 2017-04-14 DIAGNOSIS — E785 Hyperlipidemia, unspecified: Secondary | ICD-10-CM

## 2017-04-14 DIAGNOSIS — Z6838 Body mass index (BMI) 38.0-38.9, adult: Secondary | ICD-10-CM | POA: Diagnosis not present

## 2017-04-14 DIAGNOSIS — Z86718 Personal history of other venous thrombosis and embolism: Secondary | ICD-10-CM | POA: Diagnosis not present

## 2017-04-14 HISTORY — PX: LEFT HEART CATH AND CORONARY ANGIOGRAPHY: CATH118249

## 2017-04-14 HISTORY — DX: Endocarditis, valve unspecified: I38

## 2017-04-14 HISTORY — PX: CORONARY/GRAFT ACUTE MI REVASCULARIZATION: CATH118305

## 2017-04-14 HISTORY — DX: Atherosclerotic heart disease of native coronary artery without angina pectoris: I25.10

## 2017-04-14 HISTORY — DX: Other ill-defined heart diseases: I51.89

## 2017-04-14 LAB — CBC
HCT: 34.6 % — ABNORMAL LOW (ref 35.0–47.0)
Hemoglobin: 11.7 g/dL — ABNORMAL LOW (ref 12.0–16.0)
MCH: 31.2 pg (ref 26.0–34.0)
MCHC: 33.8 g/dL (ref 32.0–36.0)
MCV: 92.4 fL (ref 80.0–100.0)
Platelets: 235 10*3/uL (ref 150–440)
RBC: 3.75 MIL/uL — ABNORMAL LOW (ref 3.80–5.20)
RDW: 13 % (ref 11.5–14.5)
WBC: 6 10*3/uL (ref 3.6–11.0)

## 2017-04-14 LAB — CBC WITH DIFFERENTIAL/PLATELET
BASOS PCT: 1 %
Basophils Absolute: 0.1 10*3/uL (ref 0–0.1)
EOS ABS: 0.1 10*3/uL (ref 0–0.7)
EOS PCT: 2 %
HCT: 35.6 % (ref 35.0–47.0)
HEMOGLOBIN: 12.1 g/dL (ref 12.0–16.0)
Lymphocytes Relative: 18 %
Lymphs Abs: 1.2 10*3/uL (ref 1.0–3.6)
MCH: 31.4 pg (ref 26.0–34.0)
MCHC: 34.1 g/dL (ref 32.0–36.0)
MCV: 92.1 fL (ref 80.0–100.0)
MONO ABS: 0.4 10*3/uL (ref 0.2–0.9)
MONOS PCT: 7 %
NEUTROS PCT: 72 %
Neutro Abs: 5 10*3/uL (ref 1.4–6.5)
PLATELETS: 266 10*3/uL (ref 150–440)
RBC: 3.86 MIL/uL (ref 3.80–5.20)
RDW: 13 % (ref 11.5–14.5)
WBC: 6.8 10*3/uL (ref 3.6–11.0)

## 2017-04-14 LAB — PROTIME-INR
INR: 1.02
PROTHROMBIN TIME: 13.3 s (ref 11.4–15.2)

## 2017-04-14 LAB — COMPREHENSIVE METABOLIC PANEL
ALT: 23 U/L (ref 14–54)
ANION GAP: 10 (ref 5–15)
AST: 29 U/L (ref 15–41)
Albumin: 3.8 g/dL (ref 3.5–5.0)
Alkaline Phosphatase: 62 U/L (ref 38–126)
BILIRUBIN TOTAL: 0.5 mg/dL (ref 0.3–1.2)
BUN: 16 mg/dL (ref 6–20)
CO2: 24 mmol/L (ref 22–32)
Calcium: 9.2 mg/dL (ref 8.9–10.3)
Chloride: 104 mmol/L (ref 101–111)
Creatinine, Ser: 0.6 mg/dL (ref 0.44–1.00)
GFR calc Af Amer: >60 mL/min (ref >60)
Glucose, Bld: 146 mg/dL — ABNORMAL HIGH (ref 65–99)
Potassium: 3.3 mmol/L — ABNORMAL LOW (ref 3.5–5.1)
Sodium: 138 mmol/L (ref 135–145)
TOTAL PROTEIN: 6.7 g/dL (ref 6.5–8.1)

## 2017-04-14 LAB — ECHOCARDIOGRAM COMPLETE
Height: 61 in
Weight: 3269.86 oz

## 2017-04-14 LAB — BASIC METABOLIC PANEL
Anion gap: 5 (ref 5–15)
BUN: 12 mg/dL (ref 6–20)
CO2: 23 mmol/L (ref 22–32)
Calcium: 8.5 mg/dL — ABNORMAL LOW (ref 8.9–10.3)
Chloride: 108 mmol/L (ref 101–111)
Creatinine, Ser: 0.6 mg/dL (ref 0.44–1.00)
GFR calc Af Amer: >60 mL/min (ref >60)
GFR calc non Af Amer: >60 mL/min (ref >60)
Glucose, Bld: 115 mg/dL — ABNORMAL HIGH (ref 65–99)
Potassium: 3.7 mmol/L (ref 3.5–5.1)
Sodium: 136 mmol/L (ref 135–145)

## 2017-04-14 LAB — POCT ACTIVATED CLOTTING TIME
Activated Clotting Time: 252 seconds
Activated Clotting Time: 252 seconds

## 2017-04-14 LAB — LIPID PANEL
Cholesterol: 210 mg/dL — ABNORMAL HIGH (ref 0–200)
HDL: 48 mg/dL (ref >40)
LDL Cholesterol: 127 mg/dL — ABNORMAL HIGH (ref 0–99)
TRIGLYCERIDES: 177 mg/dL — AB (ref <150)
Total CHOL/HDL Ratio: 4.4 RATIO
VLDL: 35 mg/dL (ref 0–40)

## 2017-04-14 LAB — GLUCOSE, CAPILLARY: Glucose-Capillary: 111 mg/dL — ABNORMAL HIGH (ref 65–99)

## 2017-04-14 LAB — TROPONIN I
TROPONIN I: 0.04 ng/mL — AB (ref <0.03)
Troponin I: 7.77 ng/mL (ref <0.03)
Troponin I: 6.78 ng/mL (ref <0.03)
Troponin I: 7.18 ng/mL (ref <0.03)

## 2017-04-14 LAB — TSH: TSH: 1.344 u[IU]/mL (ref 0.350–4.500)

## 2017-04-14 LAB — MAGNESIUM: MAGNESIUM: 1.8 mg/dL (ref 1.7–2.4)

## 2017-04-14 LAB — APTT: aPTT: 33 seconds (ref 24–36)

## 2017-04-14 LAB — MRSA PCR SCREENING: MRSA by PCR: NEGATIVE

## 2017-04-14 LAB — HEMOGLOBIN A1C
Hgb A1c MFr Bld: 5.5 % (ref 4.8–5.6)
Mean Plasma Glucose: 111.15 mg/dL

## 2017-04-14 SURGERY — CORONARY/GRAFT ACUTE MI REVASCULARIZATION
Anesthesia: Moderate Sedation

## 2017-04-14 MED ORDER — POLYETHYLENE GLYCOL 3350 17 G PO PACK
17.0000 g | PACK | Freq: Two times a day (BID) | ORAL | Status: DC
  Administered 2017-04-14 – 2017-04-16 (×4): 17 g via ORAL
  Filled 2017-04-14 (×5): qty 1

## 2017-04-14 MED ORDER — ENOXAPARIN SODIUM 40 MG/0.4ML ~~LOC~~ SOLN
40.0000 mg | SUBCUTANEOUS | Status: DC
  Administered 2017-04-15: 40 mg via SUBCUTANEOUS
  Filled 2017-04-14: qty 0.4

## 2017-04-14 MED ORDER — DOCUSATE SODIUM 100 MG PO CAPS
100.0000 mg | ORAL_CAPSULE | Freq: Two times a day (BID) | ORAL | Status: DC
  Administered 2017-04-14 – 2017-04-16 (×5): 100 mg via ORAL
  Filled 2017-04-14 (×6): qty 1

## 2017-04-14 MED ORDER — SODIUM CHLORIDE 0.9 % IV SOLN: INTRAVENOUS | Status: DC

## 2017-04-14 MED ORDER — EZETIMIBE 10 MG PO TABS
10.0000 mg | ORAL_TABLET | Freq: Every day | ORAL | Status: DC
  Administered 2017-04-14 – 2017-04-16 (×3): 10 mg via ORAL
  Filled 2017-04-14 (×3): qty 1

## 2017-04-14 MED ORDER — TICAGRELOR 90 MG PO TABS
90.0000 mg | ORAL_TABLET | Freq: Two times a day (BID) | ORAL | Status: DC
  Administered 2017-04-14 – 2017-04-16 (×4): 90 mg via ORAL
  Filled 2017-04-14 (×5): qty 1

## 2017-04-14 MED ORDER — SODIUM CHLORIDE 0.9 % IV SOLN: 250.0000 mL | INTRAVENOUS | Status: DC | PRN

## 2017-04-14 MED ORDER — LISINOPRIL 20 MG PO TABS
20.0000 mg | ORAL_TABLET | Freq: Every day | ORAL | Status: DC
  Administered 2017-04-14 – 2017-04-16 (×3): 20 mg via ORAL
  Filled 2017-04-14 (×3): qty 1

## 2017-04-14 MED ORDER — NITROGLYCERIN 5 MG/ML IV SOLN
INTRAVENOUS | Status: AC
  Filled 2017-04-14: qty 10

## 2017-04-14 MED ORDER — TRAMADOL HCL 50 MG PO TABS: 50.0000 mg | ORAL_TABLET | Freq: Four times a day (QID) | ORAL | Status: DC | PRN

## 2017-04-14 MED ORDER — METHOCARBAMOL 500 MG PO TABS
500.0000 mg | ORAL_TABLET | Freq: Four times a day (QID) | ORAL | Status: DC | PRN
  Filled 2017-04-14: qty 1

## 2017-04-14 MED ORDER — LABETALOL HCL 5 MG/ML IV SOLN: 10.0000 mg | INTRAVENOUS | Status: AC | PRN

## 2017-04-14 MED ORDER — FENTANYL CITRATE (PF) 100 MCG/2ML IJ SOLN
INTRAMUSCULAR | Status: DC | PRN
  Administered 2017-04-14 (×2): 12.5 ug via INTRAVENOUS

## 2017-04-14 MED ORDER — MIDAZOLAM HCL 2 MG/2ML IJ SOLN
INTRAMUSCULAR | Status: AC
  Filled 2017-04-14: qty 2

## 2017-04-14 MED ORDER — POTASSIUM CHLORIDE CRYS ER 20 MEQ PO TBCR
40.0000 meq | EXTENDED_RELEASE_TABLET | Freq: Once | ORAL | Status: DC
  Filled 2017-04-14: qty 2

## 2017-04-14 MED ORDER — TICAGRELOR 90 MG PO TABS
ORAL_TABLET | ORAL | Status: DC | PRN
  Administered 2017-04-14: 180 mg via ORAL

## 2017-04-14 MED ORDER — SALINE SPRAY 0.65 % NA SOLN
1.0000 | Freq: Every day | NASAL | Status: DC
Start: 2017-04-14 — End: 2017-04-16
  Administered 2017-04-14 – 2017-04-16 (×3): 1 via NASAL
  Filled 2017-04-14 (×3): qty 44

## 2017-04-14 MED ORDER — POTASSIUM CHLORIDE CRYS ER 20 MEQ PO TBCR
40.0000 meq | EXTENDED_RELEASE_TABLET | ORAL | Status: AC
  Administered 2017-04-14 (×2): 40 meq via ORAL
  Filled 2017-04-14 (×2): qty 2

## 2017-04-14 MED ORDER — SODIUM CHLORIDE 0.9% FLUSH: 3.0000 mL | INTRAVENOUS | Status: DC | PRN

## 2017-04-14 MED ORDER — IOPAMIDOL (ISOVUE-300) INJECTION 61%
INTRAVENOUS | Status: DC | PRN
  Administered 2017-04-14: 180 mL

## 2017-04-14 MED ORDER — MORPHINE SULFATE (PF) 2 MG/ML IV SOLN
1.0000 mg | INTRAVENOUS | Status: DC | PRN
  Administered 2017-04-15: 2 mg via INTRAVENOUS
  Filled 2017-04-14: qty 1

## 2017-04-14 MED ORDER — HEPARIN SODIUM (PORCINE) 1000 UNIT/ML IJ SOLN
INTRAMUSCULAR | Status: AC
  Filled 2017-04-14: qty 1

## 2017-04-14 MED ORDER — FENTANYL CITRATE (PF) 100 MCG/2ML IJ SOLN
INTRAMUSCULAR | Status: AC
  Filled 2017-04-14: qty 2

## 2017-04-14 MED ORDER — ACETAMINOPHEN 325 MG PO TABS
650.0000 mg | ORAL_TABLET | ORAL | Status: DC | PRN
  Administered 2017-04-14 – 2017-04-15 (×4): 650 mg via ORAL
  Filled 2017-04-14 (×4): qty 2

## 2017-04-14 MED ORDER — HYDRALAZINE HCL 20 MG/ML IJ SOLN: 5.0000 mg | INTRAMUSCULAR | Status: AC | PRN

## 2017-04-14 MED ORDER — ATROPINE SULFATE 1 MG/10ML IJ SOSY
PREFILLED_SYRINGE | INTRAMUSCULAR | Status: AC
  Filled 2017-04-14: qty 10

## 2017-04-14 MED ORDER — TICAGRELOR 90 MG PO TABS
ORAL_TABLET | ORAL | Status: AC
  Filled 2017-04-14: qty 2

## 2017-04-14 MED ORDER — MIDAZOLAM HCL 2 MG/2ML IJ SOLN
INTRAMUSCULAR | Status: DC | PRN
  Administered 2017-04-14 (×2): 0.5 mg via INTRAVENOUS

## 2017-04-14 MED ORDER — ASPIRIN 81 MG PO CHEW
81.0000 mg | CHEWABLE_TABLET | Freq: Every day | ORAL | Status: DC
  Administered 2017-04-14 – 2017-04-16 (×3): 81 mg via ORAL
  Filled 2017-04-14 (×3): qty 1

## 2017-04-14 MED ORDER — ONDANSETRON HCL 4 MG/2ML IJ SOLN: 4.0000 mg | Freq: Four times a day (QID) | INTRAMUSCULAR | Status: DC | PRN

## 2017-04-14 MED ORDER — VERAPAMIL HCL 2.5 MG/ML IV SOLN
INTRAVENOUS | Status: DC | PRN
  Administered 2017-04-14: 2.5 mg via INTRA_ARTERIAL

## 2017-04-14 MED ORDER — METOPROLOL TARTRATE 25 MG PO TABS
12.5000 mg | ORAL_TABLET | Freq: Two times a day (BID) | ORAL | Status: DC
  Administered 2017-04-14 – 2017-04-15 (×5): 12.5 mg via ORAL
  Filled 2017-04-14 (×5): qty 1

## 2017-04-14 MED ORDER — SODIUM CHLORIDE 0.9% FLUSH
3.0000 mL | Freq: Two times a day (BID) | INTRAVENOUS | Status: DC
  Administered 2017-04-14 – 2017-04-16 (×5): 3 mL via INTRAVENOUS

## 2017-04-14 MED ORDER — FERROUS SULFATE 325 (65 FE) MG PO TABS
325.0000 mg | ORAL_TABLET | Freq: Three times a day (TID) | ORAL | Status: DC
  Administered 2017-04-14 – 2017-04-16 (×7): 325 mg via ORAL
  Filled 2017-04-14 (×7): qty 1

## 2017-04-14 MED ORDER — HEPARIN SODIUM (PORCINE) 1000 UNIT/ML IJ SOLN
INTRAMUSCULAR | Status: DC | PRN
  Administered 2017-04-14: 3000 [IU] via INTRAVENOUS
  Administered 2017-04-14: 8000 [IU] via INTRAVENOUS

## 2017-04-14 MED ORDER — ASPIRIN 81 MG PO CHEW: 324.0000 mg | CHEWABLE_TABLET | Freq: Once | ORAL | Status: DC

## 2017-04-14 MED ORDER — LIDOCAINE HCL (PF) 1 % IJ SOLN
INTRAMUSCULAR | Status: DC | PRN
  Administered 2017-04-14: 02:00:00

## 2017-04-14 MED ORDER — SODIUM CHLORIDE 0.9 % IV SOLN
INTRAVENOUS | Status: AC
  Administered 2017-04-14: 04:00:00 via INTRAVENOUS

## 2017-04-14 MED ORDER — VERAPAMIL HCL 2.5 MG/ML IV SOLN
INTRAVENOUS | Status: AC
  Filled 2017-04-14: qty 2

## 2017-04-14 MED ORDER — ATROPINE SULFATE 1 MG/10ML IJ SOSY
PREFILLED_SYRINGE | INTRAMUSCULAR | Status: DC | PRN
  Administered 2017-04-14: 0.5 mg via INTRAVENOUS

## 2017-04-14 SURGICAL SUPPLY — 16 items
BALLN TREK RX 2.25X12 (BALLOONS) ×3
BALLN ~~LOC~~ EUPHORA RX 3.5X20 (BALLOONS) ×3
BALLOON TREK RX 2.25X12 (BALLOONS) IMPLANT
BALLOON ~~LOC~~ EUPHORA RX 3.5X20 (BALLOONS) IMPLANT
CATH INFINITI 5 FR JL3.5 (CATHETERS) ×2 IMPLANT
CATH INFINITI 5FR ANG PIGTAIL (CATHETERS) ×2 IMPLANT
CATH INFINITI 5FR JL4 (CATHETERS) ×2 IMPLANT
CATH VISTA GUIDE 6FR JR4 (CATHETERS) ×2 IMPLANT
DEVICE INFLAT 30 PLUS (MISCELLANEOUS) ×2 IMPLANT
DEVICE RAD TR BAND REGULAR (VASCULAR PRODUCTS) ×2 IMPLANT
GLIDESHEATH SLEND SS 6F .021 (SHEATH) ×2 IMPLANT
KIT MANI 3VAL PERCEP (MISCELLANEOUS) ×3 IMPLANT
PACK CARDIAC CATH (CUSTOM PROCEDURE TRAY) ×3 IMPLANT
STENT RESOLUTE ONYX 3.5X38 (Permanent Stent) ×2 IMPLANT
WIRE G HI TQ BMW 190 (WIRE) ×2 IMPLANT
WIRE ROSEN-J .035X260CM (WIRE) ×2 IMPLANT

## 2017-04-14 NOTE — Progress Notes (Signed)
*  PRELIMINARY RESULTS* Echocardiogram 2D Echocardiogram has been performed.  Hailey Shields, Hailey Shields 04/14/2017, 3:58 PM

## 2017-04-14 NOTE — Consult Note (Signed)
Cardiology Consultation:   Patient ID: Hailey Shields; [id number]; [birth year]   Admit date: 04/14/2017 Date of Consult: 04/14/2017  Primary Care Provider: Olen Cordial, MD Primary Cardiologist: Thurmon Fair, MD  Primary Electrophysiologist:  None   Patient Profile:   Hailey Shields is a 72 y.o. female with a hx of hypertension, hyperlipidemia (intolerant of statins), mild aortic stenosis, TIA, DVT/PE following remote knee surgery, and ovarian cancer, who is being seen today for the evaluation of chest pain and abnormal EKG at the request of Dr. Manson Passey.  History of Present Illness:   Hailey Shields underwent right total knee replacement earlier this month (04/06/17) and was recovering well at home. Earlier tonight, around 11:30, she developed acute substernal chest pain and pressure radiating to the neck with accompanying shortness of breath and nausea. She called 911 and was found to have inferior ST segment elevation. She received ASA and NTG x 1 with improvement in her chest pain to 5/10. EKG in the ED confirmed inferior ST segment elevation. She was therefore referred for [facility] and possible PCI. Of note, she had been taking apixaban 5 mg BID since being discharged on 04/08/17 following her knee replacement.  Cardiac catheterization showed thrombotic occlusion of the proximal/mid RCA, which was treated with a single drug-eluting stent. Mild to moderate, non-critical disease was also noted in the left coronary artery (up to 60% int he proximal/mid LAD). She is current chest pain free.  Past Medical History:  Diagnosis Date  . Carpal tunnel syndrome of right wrist    effects thumb and first two fingers  . Heart murmur   . History of chemotherapy 2004  COMPLETED  . 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 History of exercise stress test 03-24-2016   dr croitoru   per note Intermediate risk secondary to poor exercise effort, no ecg ischemia; needs nuclear stress  test  . History of kidney stones   . History of ovarian cancer 2003--- per pt no recurrence   s/p  TAH w/ BSO and completed chemotherapy in 2004 , no radiation  . History of pulmonary embolus (PE) 08/2015   post LEFT total knee arthroplasty  . History of transient ischemic attack (TIA) 2004   "just after chemotherapy"  . Hypertension   . Nonrheumatic aortic (valve) stenosis    per last echo 04-06-2016  mild stenosis w/ valve area 1.69cm^2  . OA (osteoarthritis)    RIGHT KNEE,  FINGERS , ELBOWS  . Right knee pain   . Wears glasses     Past Surgical History:  Procedure Laterality Date  . INSERTION OF MESH N/A 11/27/2014   Procedure: INSERTION OF MESH;  Surgeon: Darnell Level, MD;  Location: [facility];  Service: General;  Laterality: N/A;  . LAPAROTOMY N/A 11/27/2014   Procedure: EXPLORATORY LAPAROTOMY;  Surgeon: Darnell Level, MD;  Location: [facility];  Service: General;  Laterality: N/A;  . LUMBAR FUSION  2009  . TONSILLECTOMY  age 42  . TOTAL ABDOMINAL HYSTERECTOMY W/ BILATERAL SALPINGOOPHORECTOMY  2003   AND CHOLECYSTECTOMY/  MULTIPLE LYMPH NODE DISSECTIONS  . TOTAL KNEE ARTHROPLASTY Left 08/27/2015   Procedure: LEFT TOTAL KNEE ARTHROPLASTY;  Surgeon: Durene Romans, MD;  Location: [facility];  Service: Orthopedics;  Laterality: Left;  . TOTAL KNEE ARTHROPLASTY Right 04/06/2017   Procedure: RIGHT TOTAL KNEE ARTHROPLASTY;  Surgeon: Durene Romans, MD;  Location: [facility];  Service: Orthopedics;  Laterality: Right;  70 mins  . TRANSTHORACIC  ECHOCARDIOGRAM  04-06-2016  dr croitoru   ef 55-60%,  grade 1 diastolic dysfunction/  mild AV stenosis (valve area 1.69cm^2)/ trivial MR/  mild LAE/  mild TR  . UMBILICAL HERNIA REPAIR  2001  APPROX.  Marland Kitchen VENTRAL HERNIA REPAIR N/A 11/27/2014   Procedure: HERNIA REPAIR VENTRAL ADULT;  Surgeon: Darnell Level, MD;  Location: [facility];  Service: General;  Laterality: N/A;  . VENTRAL HERNIA REPAIR  2008 APPROX.     Home Medications:  Prior to Admission medications     Medication Sig Start Date Micael Barb Date Taking? Authorizing Provider  acetaminophen (TYLENOL) 500 MG tablet Take 2 tablets (1,000 mg total) by mouth every 8 (eight) hours. 04/06/17   Lanney Gins, PA-C  aspirin (ASPIRIN CHILDRENS) 81 MG chewable tablet Chew 1 tablet (81 mg total) by mouth 2 (two) times daily. Take for 4 weeks, then resume regular dose. 04/07/17 05/07/17  Lanney Gins, PA-C  chlorpheniramine (ALLER-CHLOR) 4 MG tablet Take 4 mg by mouth daily.     [provider]  docusate sodium (COLACE) 100 MG capsule Take 1 capsule (100 mg total) by mouth 2 (two) times daily. 04/06/17   Lanney Gins, PA-C  ferrous sulfate (FERROUSUL) 325 (65 FE) MG tablet Take 1 tablet (325 mg total) by mouth 3 (three) times daily with meals. 04/06/17   Lanney Gins, PA-C  lisinopril-hydrochlorothiazide (PRINZIDE,ZESTORETIC) 20-12.5 MG per tablet Take 1 tablet by mouth daily.    [provider]  methocarbamol (ROBAXIN) 500 MG tablet Take 1 tablet (500 mg total) by mouth every 6 (six) hours as needed for muscle spasms. 04/06/17   Lanney Gins, PA-C  Multiple Vitamin (MULTIVITAMIN) tablet Take 1 tablet by mouth daily.    [provider]  ondansetron (ZOFRAN) 4 MG tablet Take 1 tablet (4 mg total) by mouth every 8 (eight) hours as needed for nausea. 04/08/17   Lanney Gins, PA-C  polyethylene glycol (MIRALAX / GLYCOLAX) packet Take 17 g by mouth 2 (two) times daily. 04/06/17   Lanney Gins, PA-C  sodium chloride (OCEAN) 0.65 % SOLN nasal spray Place 1 spray into both nostrils daily.     [provider]  traMADol (ULTRAM) 50 MG tablet Take 1 tablet (50 mg total) by mouth every 6 (six) hours as needed. 04/08/17   Lanney Gins, PA-C    Inpatient Medications: Scheduled Meds: . aspirin  81 mg Oral Daily  . docusate sodium  100 mg Oral BID  . [START ON 04/15/2017] enoxaparin (LOVENOX) injection  40 mg Subcutaneous Q24H  . ferrous sulfate  325 mg Oral TID WC  . metoprolol  tartrate  12.5 mg Oral BID  . polyethylene glycol  17 g Oral BID  . sodium chloride  1 spray Each Nare Daily  . sodium chloride flush  3 mL Intravenous Q12H  . ticagrelor  90 mg Oral BID   Continuous Infusions: . sodium chloride    . sodium chloride    . sodium chloride     PRN Meds: sodium chloride, acetaminophen, hydrALAZINE, labetalol, methocarbamol, morphine injection, ondansetron (ZOFRAN) IV, sodium chloride flush  Allergies:    Allergies  Allergen Reactions  . Codeine Other (See Comments)    Hallucinations  . Hydrocodone Other (See Comments)    hallucinations  . Oxycodone Other (See Comments)    hallucinations  . Prednisone Other (See Comments)    Increase heart rate  . Statins Other (See Comments)    Myalgia, weakness   . Tramadol Nausea And Vomiting  . Zithromax [Azithromycin]  Other (See Comments)    Increase heart rate    Social History:   Social History   Socioeconomic History  . Marital status: Widowed    Spouse name: Not on file  . Number of children: Not on file  . Years of education: Not on file  . Highest education level: Not on file  Social Needs  . Financial resource strain: Not on file  . Food insecurity - worry: Not on file  . Food insecurity - inability: Not on file  . Transportation needs - medical: Not on file  . Transportation needs - non-medical: Not on file  Occupational History  . Not on file  Tobacco Use  . Smoking status: Never Smoker  . Smokeless tobacco: Never Used  Substance and Sexual Activity  . Alcohol use: No  . Drug use: No  . Sexual activity: Not on file  Other Topics Concern  . Not on file  Social History Narrative  . Not on file    Family History:   Family History  Problem Relation Age of Onset  . Heart attack Brother      ROS:  Review of Systems  Unable to perform ROS: Acuity of condition   Physical Exam/Data:   Vitals:   04/14/17 0057 04/14/17 0058 04/14/17 0232  BP: 137/81  127/66  Pulse: 87  99    Resp: 18  18  Temp:  97.9 F (36.6 C) 98.4 F (36.9 C)  TempSrc:  Oral Oral  SpO2: 99%  98%  Weight:   204 lb 5.9 oz (92.7 kg)  Height:   5\' 1"  (1.549 m)   No intake or output data in the 24 hours ending 04/14/17 0254 Filed Weights   04/14/17 0232  Weight: 204 lb 5.9 oz (92.7 kg)   Body mass index is 38.61 kg/m.  General:  Obese woman, lying on stretcher. HEENT: normal Lymph: no adenopathy Neck: no JVD Endocrine:  No thryomegaly Vascular: No carotid bruits; 2+ radial and femoral pulses. Cardiac:  normal S1, S2; RRR; 2/6 systolic murmur loudest at the RUSB. No rubs. Lungs:  clear to auscultation bilaterally, no wheezing, rhonchi or rales  Abd: soft, nontender, no hepatomegaly  Ext: Trace LE edema. Musculoskeletal:  No deformities. Right knee surgical site covered with clean dressing. Skin: warm and dry  Neuro:  CNs 2-12 intact, no focal abnormalities noted Psych:  Normal affect   EKG:  The EKG was personally reviewed and demonstrates:  NSR with inferior ST segment elevation and lateral ST depressions.  Relevant CV Studies: LHC/PCI (04/14/17): 1. Inferior STEMI with thrombotic occlusion of the proximal/mid RCA. 2. Mild to moderate, non-critical disease involving the LMCA, LAD, and LCx. 3. Basal and mid inferior akinesis with otherwise hyperdynamic LV contraction. LVEF 55-65%. 4. Normal left ventricular filling pressure. 5. Mild aortic stenosis (peak-to-peak gradient 15 mmHg). 6. Successful primary PCI to the proximal and mid RCA using a Resolute Onyx 3.5 x 38 mm drug-eluting stent with 0% residual stenosis and TIMI-3 flow.  TTE (04/06/16): - Left ventricle: The cavity size was normal. Systolic function was   normal. The estimated ejection fraction was in the range of 55%   to 60%. Wall motion was normal; there were no regional wall   motion abnormalities. Doppler parameters are consistent with   abnormal left ventricular relaxation (grade 1 diastolic   dysfunction).  Doppler parameters are consistent with elevated   ventricular Wyley Hack-diastolic filling pressure. - Aortic valve: Possibly bicuspid; mildly thickened, mildly   calcified leaflets.  There was mild stenosis. Peak gradient (S):   28 mm Hg. Valve area (VTI): 1.69 cm^2. Valve area (Vmax): 1.7   cm^2. Valve area (Vmean): 1.81 cm^2. - Aortic root: The aortic root was normal in size. - Mitral valve: There was trivial regurgitation. - Left atrium: The atrium was mildly dilated. - Right ventricle: Systolic function was normal. - Tricuspid valve: There was mild regurgitation. - Pulmonic valve: There was no regurgitation. - Pulmonary arteries: Systolic pressure was within the normal   range. - Inferior vena cava: The vessel was normal in size. - Pericardium, extracardiac: There was no pericardial effusion.  Laboratory Data:  Chemistry Recent Labs  Lab 04/07/17 0559 04/08/17 0540 04/14/17 0054  NA 137 140 138  K 3.6 3.7 3.3*  CL 107 108 104  CO2 23 25 24   GLUCOSE 88 123* 146*  BUN 14 10 16   CREATININE 0.42* 0.46 0.60  CALCIUM 8.4* 8.6* 9.2  GFRNONAA >60 >60 >60  GFRAA >60 >60 >60  ANIONGAP 7 7 10     Recent Labs  Lab 04/14/17 0054  PROT 6.7  ALBUMIN 3.8  AST 29  ALT 23  ALKPHOS 62  BILITOT 0.5   Hematology Recent Labs  Lab 04/07/17 0559 04/08/17 0540 04/14/17 0054  WBC 6.4 6.5 6.8  RBC 3.60* 3.68* 3.86  HGB 11.1* 11.4* 12.1  HCT 33.1* 34.5* 35.6  MCV 91.9 93.8 92.1  MCH 30.8 31.0 31.4  MCHC 33.5 33.0 34.1  RDW 12.7 12.9 13.0  PLT 215 220 266   Cardiac Enzymes Recent Labs  Lab 04/14/17 0054  TROPONINI 0.04*   No results for input(s): TROPIPOC in the last 168 hours.  BNPNo results for input(s): BNP, PROBNP in the last 168 hours.  DDimer No results for input(s): DDIMER in the last 168 hours.  Radiology/Studies:  No results found.  Assessment and Plan:   Inferior STEMI Patient presented with acute onset of chest pain this evening. EKG showed inferior STEMI with  catheterization confirming thrombotic occlusion of the proximal/mid RCA, most likely due to acute plaque rupture (type 1 MI). Hailey Shields is now chest pain free following primary PCI to the RCA.  Admit to ICU.  Dual antiplatelet therapy for for at least 12 months, ideally with aspirin and ticagrelor. If apixaban needs to be restarted for DVT prophylaxis following knee surgery earlier this month, ticagrelor should be transitioned to clopidogrel and triple therapy (aspirin, clopidogrel, and apixaban) continued until it is felt safe to discontinue apixaban from a postoperative standpoint.  Trend troponin until it has peaked, the stop.  Obtain transthoracic echocardiogram.  Aggressive secondary prevention, including lipid therapy (see below) and weight loss.  Gentle post-cath hydration.  Medical therapy for non-critical left coronary artery disease.  Outpatient cardiac rehab.  Ischemic cardiomyopathy LVEF overall preserved with basal/mid inferior akinesis, likely due to tonight's STEMI. Mild leg edema may be related to recent surgery. LVEDP normal.  Obtain TTE.  Initiate metoprolol 12.5 mg BID, to be uptitrated as heart rate and blood pressure allow.  Aortic stenosis Mild by echo last year and on pullback on today's catheterization.  Repeat echo during this admission for further evaluation.  Outpatient follow-up.  Hyperlipidemia Goal LDL < 70, given today's STEMI and history of remote TIA.  Will need to discuss rechallenging the patient with a statin versus trial of ezetimibe/PCSK9 inhibitor.  Hypertension Blood pressure normal at Sabreen Kitchen of catheterization.  Initiate metoprolol tartrate 12.5 mg BID.  Hold lisinopril/HCTZ for now. May need to be added back  based on blood pressure as patient recovers from acute MI.  Right knee replacement and history of post-operative DVT/PE Patient was taking apixaban 5 mg BID for DVT prophylaxis following her total knee replacement 8 days  ago.  Hold apixaban for now; enoxaparin ordered for DVT prophylaxis.  Will need to touch base with orthopedics (procedure done by Dr. Charlann Boxer) regarding need to restart apixaban at time of discharge, as well as duration of therapy, as this may impact antiplatelet regimen (see details above).  Hypokalemia K 3.3  Replete orally for goal K > 4.0.  Check magnesium level.  Disposition:  Admit to ICU under hospitalist service.  Patient should remain in the ICU for at least 24 hours, after which time she can be moved to telemetry if clinically stable. She should remain hospitalized for at least 48 hours from the time of primary PCI.  For questions or updates, please contact [facility] Please consult [email or web address] for contact info under [facility] STEMI   Signed, Yvonne Kendall, MD  04/14/2017 2:54 AM

## 2017-04-14 NOTE — Progress Notes (Signed)
NSTEMI/STEMI Patient Quality Metric Evaluation Tool    Beta-blocker prescribed at discharge Yes If no:    "K level", "not indicated", "DDI", "ADR"   N/A  Statin prescribed at discharge:  No  Myalgias (joint pain to both atorvastatin and rosuvastatin)  LVEF: 55-60%  ACE-I or ARB continued at discharge:  Yes  Aldosterone blocking agent at discharge:  No  LVEF >40% Patient does not qualify for aldosterone antagonist, N/A  ASA prescribed at discharge:  Yes  N/A  Smoking cessation counseling given:  No - patient does not smoke.  If no: Offered? No  Cardiac rehabilitation referral given:  No  Not addressed at this time, will discuss with Hospitalist   Electronically Signed: Bernis Schreur L 04/14/2017, 11:07 AM

## 2017-04-14 NOTE — ED Provider Notes (Signed)
[facility] Emergency Department Provider Note ____   First MD Initiated Contact with Patient 04/14/17 [phone]     (approximate)  I have reviewed the triage vital signs and the nursing notes.   HISTORY  Chief Complaint No chief complaint on file.    HPI Hailey Shields is a 72 y.o. female with below list of chronic medical conditions including recent right knee replacement on the 19th currently taking Eliquis 5 mg twice daily presents to the emergency department via EMS with acute onset of chest pain with associated dyspnea which is since improved.  Patient states current pain score is 5 out of 10 described as pressure.  Patient denies any dizziness no diaphoresis.   Past Medical History:  Diagnosis Date  . Carpal tunnel syndrome of right wrist    effects thumb and first two fingers  . Heart murmur   . History of chemotherapy 2004  COMPLETED  . 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 History of exercise stress test 03-24-2016   dr croitoru   per note Intermediate risk secondary to poor exercise effort, no ecg ischemia; needs nuclear stress test  . History of kidney stones   . History of ovarian cancer 2003--- per pt no recurrence   s/p  TAH w/ BSO and completed chemotherapy in 2004 , no radiation  . History of pulmonary embolus (PE) 08/2015   post LEFT total knee arthroplasty  . History of transient ischemic attack (TIA) 2004   "just after chemotherapy"  . Hypertension   . Nonrheumatic aortic (valve) stenosis    per last echo 04-06-2016  mild stenosis w/ valve area 1.69cm^2  . OA (osteoarthritis)    RIGHT KNEE,  FINGERS , ELBOWS  . Right knee pain   . Wears glasses     Patient Active Problem List   Diagnosis Date Noted  . S/P right TKA 04/06/2017  . 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 Essential hypertension 03/20/2016  . Nonrheumatic aortic valve stenosis 03/20/2016  . Mixed hyperlipidemia 03/20/2016  .  Coronary artery calcification seen on CT scan 03/20/2016  . History of pulmonary embolism 03/20/2016  . Obese 08/29/2015  . S/P left TKA 08/27/2015  . Small bowel obstruction (HCC) 11/26/2014  . Incarcerated incisional hernia 11/26/2014    Past Surgical History:  Procedure Laterality Date  . INSERTION OF MESH N/A 11/27/2014   Procedure: INSERTION OF MESH;  Surgeon: Darnell Level, MD;  Location: [facility];  Service: General;  Laterality: N/A;  . LAPAROTOMY N/A 11/27/2014   Procedure: EXPLORATORY LAPAROTOMY;  Surgeon: Darnell Level, MD;  Location: [facility];  Service: General;  Laterality: N/A;  . LUMBAR FUSION  2009  . TONSILLECTOMY  age 27  . TOTAL ABDOMINAL HYSTERECTOMY W/ BILATERAL SALPINGOOPHORECTOMY  2003   AND CHOLECYSTECTOMY/  MULTIPLE LYMPH NODE DISSECTIONS  . TOTAL KNEE ARTHROPLASTY Left 08/27/2015   Procedure: LEFT TOTAL KNEE ARTHROPLASTY;  Surgeon: Durene Romans, MD;  Location: [facility];  Service: Orthopedics;  Laterality: Left;  . TOTAL KNEE ARTHROPLASTY Right 04/06/2017   Procedure: RIGHT TOTAL KNEE ARTHROPLASTY;  Surgeon: Durene Romans, MD;  Location: [facility];  Service: Orthopedics;  Laterality: Right;  70 mins  . TRANSTHORACIC ECHOCARDIOGRAM  04-06-2016  dr croitoru   ef 55-60%,  grade 1 diastolic dysfunction/  mild AV stenosis (valve area 1.69cm^2)/ trivial MR/  mild LAE/  mild TR  . UMBILICAL HERNIA REPAIR  2001  APPROX.  Marland Kitchen  VENTRAL HERNIA REPAIR N/A 11/27/2014   Procedure: HERNIA REPAIR VENTRAL ADULT;  Surgeon: Darnell Level, MD;  Location: [facility];  Service: General;  Laterality: N/A;  . VENTRAL HERNIA REPAIR  2008 APPROX.    Prior to Admission medications   Medication Sig Start Date End Date Taking? Authorizing Provider  acetaminophen (TYLENOL) 500 MG tablet Take 2 tablets (1,000 mg total) by mouth every 8 (eight) hours. 04/06/17   Lanney Gins, PA-C  aspirin (ASPIRIN CHILDRENS) 81 MG chewable tablet Chew 1 tablet (81 mg total) by mouth 2 (two) times daily. Take for 4 weeks, then  resume regular dose. 04/07/17 05/07/17  Lanney Gins, PA-C  chlorpheniramine (ALLER-CHLOR) 4 MG tablet Take 4 mg by mouth daily.     [provider]  docusate sodium (COLACE) 100 MG capsule Take 1 capsule (100 mg total) by mouth 2 (two) times daily. 04/06/17   Lanney Gins, PA-C  ferrous sulfate (FERROUSUL) 325 (65 FE) MG tablet Take 1 tablet (325 mg total) by mouth 3 (three) times daily with meals. 04/06/17   Lanney Gins, PA-C  lisinopril-hydrochlorothiazide (PRINZIDE,ZESTORETIC) 20-12.5 MG per tablet Take 1 tablet by mouth daily.    [provider]  methocarbamol (ROBAXIN) 500 MG tablet Take 1 tablet (500 mg total) by mouth every 6 (six) hours as needed for muscle spasms. 04/06/17   Lanney Gins, PA-C  Multiple Vitamin (MULTIVITAMIN) tablet Take 1 tablet by mouth daily.    [provider]  ondansetron (ZOFRAN) 4 MG tablet Take 1 tablet (4 mg total) by mouth every 8 (eight) hours as needed for nausea. 04/08/17   Lanney Gins, PA-C  polyethylene glycol (MIRALAX / GLYCOLAX) packet Take 17 g by mouth 2 (two) times daily. 04/06/17   Lanney Gins, PA-C  sodium chloride (OCEAN) 0.65 % SOLN nasal spray Place 1 spray into both nostrils daily.     [provider]  traMADol (ULTRAM) 50 MG tablet Take 1 tablet (50 mg total) by mouth every 6 (six) hours as needed. 04/08/17   Lanney Gins, PA-C    Allergies Codeine; Hydrocodone; Oxycodone; Prednisone; Statins; Tramadol; and Zithromax [azithromycin]  Family History  Problem Relation Age of Onset  . Heart attack Brother     Social History Social History   Tobacco Use  . Smoking status: Never Smoker  . Smokeless tobacco: Never Used  Substance Use Topics  . Alcohol use: No  . Drug use: No    Review of Systems Constitutional: No fever/chills Eyes: No visual changes. ENT: No sore throat. Cardiovascular: Denies chest pain. Respiratory: Denies shortness of breath. Gastrointestinal: No abdominal  pain.  No nausea, no vomiting.  No diarrhea.  No constipation. Genitourinary: Negative for dysuria. Musculoskeletal: Negative for neck pain.  Negative for back pain. Integumentary: Negative for rash. Neurological: Negative for headaches, focal weakness or numbness.   ____________________________________________   PHYSICAL EXAM:  VITAL SIGNS: ED Triage Vitals  Enc Vitals Group     BP      Pulse      Resp      Temp      Temp src      SpO2      Weight      Height      Head Circumference      Peak Flow      Pain Score      Pain Loc      Pain Edu?      Excl. in GC?     Constitutional: Alert and oriented. Well appearing  and in no acute distress. Eyes: Conjunctivae are normal. Head: Atraumatic. Mouth/Throat: Mucous membranes are moist. Oropharynx non-erythematous. Neck: No stridor.   Cardiovascular: Normal rate, regular rhythm. Good peripheral circulation. Grossly normal heart sounds. Respiratory: Normal respiratory effort.  No retractions. Lungs CTAB. Gastrointestinal: Soft and nontender. No distention.  Musculoskeletal: No lower extremity tenderness nor edema. No gross deformities of extremities. Neurologic:  Normal speech and language. No gross focal neurologic deficits are appreciated.  Skin:  Skin is warm, dry and intact. No rash noted.   ____________________________________________   LABS (all labs ordered are listed, but only abnormal results are displayed)  Labs Reviewed - No data to display ____________________________________________  EKG  ED ECG REPORT I, Brock N BROWN, the attending physician, personally viewed and interpreted this ECG.   Date: 04/14/2017  EKG Time: 1:00 AM  Rate: 85  Rhythm: Normal sinus rhythm  Axis: Normal  Intervals: Normal  ST&T Change: Inferior ST segment elevation with anterior T wave inversion  ED ECG REPORT I, Winter N BROWN, the attending physician, personally viewed and interpreted this ECG.   Date: 04/14/2017   EKG Time: 1:03 AM  Rate: 88  Rhythm: Normal sinus rhythm  Axis: Normal  Intervals: Normal  ST&T Change: Inferior ST segment elevation greater than 2 mm to 3 and aVF with anterior ST segment depression.    Procedures  Critical Care: CRITICAL CARE Performed by: Darci CurrentANDOLPH N BROWN   Total critical care time: 30 minutes  Critical care time was exclusive of separately billable procedures and treating other patients.  Critical care was necessary to treat or prevent imminent or life-threatening deterioration.  Critical care was time spent personally by me on the following activities: development of treatment plan with patient and/or surrogate as well as nursing, discussions with consultants, evaluation of patient's response to treatment, examination of patient, obtaining history from patient or surrogate, ordering and performing treatments and interventions, ordering and review of laboratory studies, ordering and review of radiographic studies, pulse oximetry and re-evaluation of patient's condition.  ____________________________________________   INITIAL IMPRESSION / ASSESSMENT AND PLAN / ED COURSE  As part of my medical decision making, I reviewed the following data within the electronic MEDICAL RECORD NUMBER  72 year old female present in the emergency department to code STEMI based on EMS EKG which revealed ST segment and elevation inferiorly.  Concern for STEMI on arrival to the emergency department based on EKGs performed in the ED.  EMS administered aspirin and 1 sublingual nitroglycerin before arrival.  Dr. Okey DupreEnd cardiology was present at bedside on patient arrival.  Patient taken in the Cath Lab for further evaluation and management.    ____________________________________________  FINAL CLINICAL IMPRESSION(S) / ED DIAGNOSES  Final diagnoses:  ST elevation myocardial infarction (STEMI) involving other coronary artery of inferior wall (HCC)     MEDICATIONS GIVEN DURING THIS  VISIT:  Medications  0.9 %  sodium chloride infusion (not administered)  aspirin chewable tablet 324 mg (not administered)     ED Discharge Orders    None       Note:  This document was prepared using Dragon voice recognition software and may include unintentional dictation errors.    Darci CurrentBrown, [place] N, MD 04/14/17 2252

## 2017-04-14 NOTE — ED Notes (Signed)
Pt left to the cath lab.

## 2017-04-14 NOTE — Consult Note (Signed)
Name: Hailey Shields MRN: [id number] DOB: 14-Jan-1946    ADMISSION DATE:  04/14/2017 CONSULTATION DATE: 04/14/2017  REFERRING MD : Dr. Okey DupreEnd   CHIEF COMPLAINT: Chest Pain  BRIEF PATIENT DESCRIPTION: 72 yo female admitted with STEMI s/p cardiac catheterization revealing 100% occlusion to right RCA requiring drug eluting stent   SIGNIFICANT EVENTS  02/27-Pt admitted to ICU post cardiac catheterization   STUDIES:  Cardiac Catheterization 02/27>>  HISTORY OF PRESENT ILLNESS:   This is a 72 yo female with a PMH of Osteoarthritis, HTN, TIA, Ovarian Cancer (completed chemotherapy 2004), PE, DVT, Right Knee Replacement on Eliquis (04/06/2017), and Heart Murmur.  [facility] ER via EMS on 02/27 with c/o acute onset of mid sternal chest pain and dyspnea.  EKG revealed STEMI pt emergently transported to cardiac cath lab results revealing 100% occlusion to right RCA requiring drug eluting stent.  She was subsequently admitted to ICU post cardiac cath for further workup and treatment.    PAST MEDICAL HISTORY :   has a past medical history of Carpal tunnel syndrome of right wrist, Heart murmur, History of chemotherapy (2004  COMPLETED), History of DVT of lower extremity (08/2015), History of exercise stress test (03-24-2016   dr croitoru), History of kidney stones, History of ovarian cancer (2003--- per pt no recurrence), History of pulmonary embolus (PE) (08/2015), History of transient ischemic attack (TIA) (2004), Hypertension, Nonrheumatic aortic (valve) stenosis, OA (osteoarthritis), Right knee pain, and Wears glasses.  has a past surgical history that includes Umbilical hernia repair (2001  APPROX.); laparotomy (N/A, 11/27/2014); Ventral hernia repair (N/A, 11/27/2014); Insertion of mesh (N/A, 11/27/2014); Total knee arthroplasty (Left, 08/27/2015); Lumbar fusion (2009); Total abdominal hysterectomy w/ bilateral salpingoophorectomy (2003); transthoracic echocardiogram (04-06-2016  dr croitoru);  Tonsillectomy (age 72); Ventral hernia repair (2008 APPROX.); and Total knee arthroplasty (Right, 04/06/2017). Prior to Admission medications   Medication Sig Start Date End Date Taking? Authorizing Provider  acetaminophen (TYLENOL) 500 MG tablet Take 2 tablets (1,000 mg total) by mouth every 8 (eight) hours. 04/06/17   Lanney GinsBabish, Matthew, PA-C  aspirin (ASPIRIN CHILDRENS) 81 MG chewable tablet Chew 1 tablet (81 mg total) by mouth 2 (two) times daily. Take for 4 weeks, then resume regular dose. 04/07/17 05/07/17  Lanney GinsBabish, Matthew, PA-C  chlorpheniramine (ALLER-CHLOR) 4 MG tablet Take 4 mg by mouth daily.     [provider]  docusate sodium (COLACE) 100 MG capsule Take 1 capsule (100 mg total) by mouth 2 (two) times daily. 04/06/17   Lanney GinsBabish, Matthew, PA-C  ferrous sulfate (FERROUSUL) 325 (65 FE) MG tablet Take 1 tablet (325 mg total) by mouth 3 (three) times daily with meals. 04/06/17   Lanney GinsBabish, Matthew, PA-C  lisinopril-hydrochlorothiazide (PRINZIDE,ZESTORETIC) 20-12.5 MG per tablet Take 1 tablet by mouth daily.    [provider]  methocarbamol (ROBAXIN) 500 MG tablet Take 1 tablet (500 mg total) by mouth every 6 (six) hours as needed for muscle spasms. 04/06/17   Lanney GinsBabish, Matthew, PA-C  Multiple Vitamin (MULTIVITAMIN) tablet Take 1 tablet by mouth daily.    [provider]  ondansetron (ZOFRAN) 4 MG tablet Take 1 tablet (4 mg total) by mouth every 8 (eight) hours as needed for nausea. 04/08/17   Lanney GinsBabish, Matthew, PA-C  polyethylene glycol (MIRALAX / GLYCOLAX) packet Take 17 g by mouth 2 (two) times daily. 04/06/17   Lanney GinsBabish, Matthew, PA-C  sodium chloride (OCEAN) 0.65 % SOLN nasal spray Place 1 spray into both nostrils daily.     [provider]  traMADol (ULTRAM) 50 MG tablet Take 1 tablet (50 mg total) by mouth every 6 (six) hours as needed. 04/08/17   Lanney Gins, PA-C   Allergies  Allergen Reactions  . Codeine Other (See Comments)    Hallucinations  . Hydrocodone  Other (See Comments)    hallucinations  . Oxycodone Other (See Comments)    hallucinations  . Prednisone Other (See Comments)    Increase heart rate  . Statins Other (See Comments)    Myalgia, weakness   . Tramadol Nausea And Vomiting  . Zithromax [Azithromycin] Other (See Comments)    Increase heart rate    FAMILY HISTORY:  family history includes Heart attack in her brother. SOCIAL HISTORY:  reports that  has never smoked. she has never used smokeless tobacco. She reports that she does not drink alcohol or use drugs.  REVIEW OF SYSTEMS: Positives in BOLD  Constitutional: Negative for fever, chills, weight loss, malaise/fatigue and diaphoresis.  HENT: Negative for hearing loss, ear pain, nosebleeds, congestion, sore throat, neck pain, tinnitus and ear discharge.   Eyes: Negative for blurred vision, double vision, photophobia, pain, discharge and redness.  Respiratory: cough, hemoptysis, sputum production, shortness of breath, wheezing and stridor.   Cardiovascular: chest pain, palpitations, orthopnea, claudication, leg swelling and PND.  Gastrointestinal: Negative for heartburn, nausea, vomiting, abdominal pain, diarrhea, constipation, blood in stool and melena.  Genitourinary: Negative for dysuria, urgency, frequency, hematuria and flank pain.  Musculoskeletal: Negative for myalgias, back pain, joint pain and falls.  Skin: Negative for itching and rash.  Neurological: Negative for dizziness, tingling, tremors, sensory change, speech change, focal weakness, seizures, loss of consciousness, weakness and headaches.  Endo/Heme/Allergies: Negative for environmental allergies and polydipsia. Does not bruise/bleed easily.  SUBJECTIVE:   VITAL SIGNS: Temp:  [97.9 F (36.6 C)] 97.9 F (36.6 C) (02/27 0058) Pulse Rate:  [87] 87 (02/27 0057) Resp:  [18] 18 (02/27 0057) BP: (137)/(81) 137/81 (02/27 0057) SpO2:  [99 %] 99 % (02/27 0057)  PHYSICAL EXAMINATION: General: well developed,  well nourished, NAD  Neuro: alert and oriented, follows commands  HEENT: supple, no JVD  Cardiovascular: nsr, s1s2, no R/G Lungs: clear throughout, even, non labored  Abdomen: +BS x4, soft, non tender, non distended  Musculoskeletal: normal bulk and tone, 1+ right knee edema  Skin: right knee incision dressing dry and intact, right radial TR band no hematoma or bleeding   Recent Labs  Lab 04/07/17 0559 04/08/17 0540  NA 137 140  K 3.6 3.7  CL 107 108  CO2 23 25  BUN 14 10  CREATININE 0.42* 0.46  GLUCOSE 88 123*   Recent Labs  Lab 04/07/17 0559 04/08/17 0540 04/14/17 0054  HGB 11.1* 11.4* 12.1  HCT 33.1* 34.5* 35.6  WBC 6.4 6.5 6.8  PLT 215 220 266   No results found.  ASSESSMENT / PLAN: STEMI s/p cardiac catheterization  Unstable Angina  Hypokalemia  Hx: DVT, PE, Recent Right Knee Replacement (on eliquis), TIA, and HTN P: Supplemental O2 for hypoxia and/or dyspnea Prn CXR Continuous telemetry monitoring  Trend troponin's Echo pending  EKG post cath  Continue cardiac medications per Cardiology recommendations  NS @125  ml/hr x4hrs  Trend BMP Replace electrolytes as indicated  Monitor UOP  Lovenox for VTE prophylaxis  Trend CBC Monitor for s/sx of bleeding and transfuse for hgb <7 Prn morphine for pain management   Sonda Rumble, AGNP  Pulmonary/Critical Care Pager [phone] (please enter 7 digits) PCCM Consult Pager [phone] (please enter 7 digits)

## 2017-04-14 NOTE — Progress Notes (Signed)
[facility] responded to code stemi; met Pt's church deacon, provided spiritual/social support, met Daughter upon arrival, corresponded to get physician with family together, accompanied daughter and deacon to ICU.

## 2017-04-14 NOTE — Progress Notes (Signed)
eLink Physician-Brief Progress Note Patient Name: Hailey Shields DOB: 02/25/1945 MRN: [id number]   Date of Service  04/14/2017  HPI/Events of Note  [age] yo female admitted with STEMI s/p cardiac catheterization revealing 100% occlusion to right RCA requiring drug eluting stent, PCCM consulted to assume care in the ICU. Management per cardiology and PCCM. VSS.   eICU Interventions  No new orders.      Intervention Category Evaluation Type: New Patient Evaluation  [name],[name] 04/14/2017, 2:49 AM

## 2017-04-14 NOTE — Care Management (Signed)
I spoke with Dr. Nilsa Nuttinglin's case manager Chong SicilianJill Lauer RN [phone]. Patient is currently doing virtual PT in [facility] with Dr. Charlann Boxerlin.  Noreene LarssonJill will follow patient's progress.

## 2017-04-14 NOTE — ED Notes (Signed)
Dr. Okey DupreEnd conducted his assessment and explain the cardiac craterization intervention to the Pt. Pt consents to the procedure. Pt received 1 sublingual Nitro in rout, 4mg  of Zofran and 324mg  of aspirin.

## 2017-04-14 NOTE — Progress Notes (Signed)
   04/14/17 1030  Clinical Encounter Type  Visited With Patient;Other (Comment) (former pastor visiting)  Visit Type Follow-up   Chaplain followed up with patient to offer emotional support.  Patient spoke of events of last night which led to her hospitalization.  Patient reviewed recent knee surgery earlier this month and supports available to her.  Patient reported that her spouse died last October.  Patient's former pastor entered to visit and offer prayer with patient, chaplain maintained pastoral presence.  Patient open to ongoing chaplain follow up.

## 2017-04-14 NOTE — H&P (Addendum)
Hailey Shields is an 72 y.o. female.   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  The pain began at rest and was pressure-like in quality.  The pain did not radiate.  The patient felt some shortness of breath during her chest pain as well.  EKG in the emergency department showed ST elevations in inferior leads which initiated the STEMI protocol which resulted in PCI to the RCA.  The patient tolerated her procedure well and was admitted to the ICU with their management per the hospitalist team.  Past Medical History:  Diagnosis Date  . Carpal tunnel syndrome of right wrist    effects thumb and first two fingers  . Heart murmur   . History of chemotherapy 2004  COMPLETED  . 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 History of exercise stress test 03-24-2016   dr croitoru   per note Intermediate risk secondary to poor exercise effort, no ecg ischemia; needs nuclear stress test  . History of kidney stones   . History of ovarian cancer 2003--- per pt no recurrence   s/p  TAH w/ BSO and completed chemotherapy in 2004 , no radiation  . History of pulmonary embolus (PE) 08/2015   post LEFT total knee arthroplasty  . History of transient ischemic attack (TIA) 2004   "just after chemotherapy"  . Hypertension   . Nonrheumatic aortic (valve) stenosis    per last echo 04-06-2016  mild stenosis w/ valve area 1.69cm^2  . OA (osteoarthritis)    RIGHT KNEE,  FINGERS , ELBOWS  . Right knee pain   . Wears glasses     Past Surgical History:  Procedure Laterality Date  . INSERTION OF MESH N/A 11/27/2014   Procedure: INSERTION OF MESH;  Surgeon: Armandina Gemma, MD;  Location: [facility];  Service: General;  Laterality: N/A;  . LAPAROTOMY N/A 11/27/2014   Procedure: EXPLORATORY LAPAROTOMY;  Surgeon: Armandina Gemma,  MD;  Location: [facility];  Service: General;  Laterality: N/A;  . LUMBAR FUSION  2009  . TONSILLECTOMY  age 89  . TOTAL ABDOMINAL HYSTERECTOMY W/ BILATERAL SALPINGOOPHORECTOMY  2003   AND CHOLECYSTECTOMY/  MULTIPLE LYMPH NODE DISSECTIONS  . TOTAL KNEE ARTHROPLASTY Left 08/27/2015   Procedure: LEFT TOTAL KNEE ARTHROPLASTY;  Surgeon: Paralee Cancel, MD;  Location: [facility];  Service: Orthopedics;  Laterality: Left;  . TOTAL KNEE ARTHROPLASTY Right 04/06/2017   Procedure: RIGHT TOTAL KNEE ARTHROPLASTY;  Surgeon: Paralee Cancel, MD;  Location: [facility];  Service: Orthopedics;  Laterality: Right;  70 mins  . TRANSTHORACIC ECHOCARDIOGRAM  04-06-2016  dr croitoru   ef 55-60%,  grade 1 diastolic dysfunction/  mild AV stenosis (valve area 1.69cm^2)/ trivial MR/  mild LAE/  mild TR  . UMBILICAL HERNIA REPAIR  2001  APPROX.  Marland Kitchen VENTRAL HERNIA REPAIR N/A 11/27/2014   Procedure: HERNIA REPAIR VENTRAL ADULT;  Surgeon: Armandina Gemma, MD;  Location: [facility];  Service: General;  Laterality: N/A;  . VENTRAL HERNIA REPAIR  2008 APPROX.    Family History  Problem Relation Age of Onset  . Heart attack Brother    Social History:  reports that  has never smoked. she has never used smokeless tobacco. She reports that she does not drink alcohol or use drugs.  Allergies:  Allergies  Allergen Reactions  .  Codeine Other (See Comments)    Hallucinations  . Hydrocodone Other (See Comments)    hallucinations  . Oxycodone Other (See Comments)    hallucinations  . Prednisone Other (See Comments)    Increase heart rate  . Statins Other (See Comments)    Myalgia, weakness   . Tramadol Nausea And Vomiting  . Zithromax [Azithromycin] Other (See Comments)    Increase heart rate    Medications Prior to Admission  Medication Sig Dispense Refill  . acetaminophen (TYLENOL) 500 MG tablet Take 2 tablets (1,000 mg total) by mouth every 8 (eight) hours. 30 tablet 0  . aspirin (ASPIRIN CHILDRENS) 81 MG chewable tablet Chew 1 tablet  (81 mg total) by mouth 2 (two) times daily. Take for 4 weeks, then resume regular dose. 60 tablet 0  . chlorpheniramine (ALLER-CHLOR) 4 MG tablet Take 4 mg by mouth daily.     Marland Kitchen docusate sodium (COLACE) 100 MG capsule Take 1 capsule (100 mg total) by mouth 2 (two) times daily. 10 capsule 0  . ferrous sulfate (FERROUSUL) 325 (65 FE) MG tablet Take 1 tablet (325 mg total) by mouth 3 (three) times daily with meals.  3  . lisinopril-hydrochlorothiazide (PRINZIDE,ZESTORETIC) 20-12.5 MG per tablet Take 1 tablet by mouth daily.    . methocarbamol (ROBAXIN) 500 MG tablet Take 1 tablet (500 mg total) by mouth every 6 (six) hours as needed for muscle spasms. 40 tablet 0  . Multiple Vitamin (MULTIVITAMIN) tablet Take 1 tablet by mouth daily.    . ondansetron (ZOFRAN) 4 MG tablet Take 1 tablet (4 mg total) by mouth every 8 (eight) hours as needed for nausea. 30 tablet 0  . polyethylene glycol (MIRALAX / GLYCOLAX) packet Take 17 g by mouth 2 (two) times daily. 14 each 0  . sodium chloride (OCEAN) 0.65 % SOLN nasal spray Place 1 spray into both nostrils daily.     . traMADol (ULTRAM) 50 MG tablet Take 1 tablet (50 mg total) by mouth every 6 (six) hours as needed. 40 tablet 0    Results for orders placed or performed during the hospital encounter of 04/14/17 (from the past 48 hour(s))  CBC with Differential/Platelet     Status: None   Collection Time: 04/14/17 12:54 AM  Result Value Ref Range   WBC 6.8 3.6 - 11.0 K/uL   RBC 3.86 3.80 - 5.20 MIL/uL   Hemoglobin 12.1 12.0 - 16.0 g/dL   HCT 35.6 35.0 - 47.0 %   MCV 92.1 80.0 - 100.0 fL   MCH 31.4 26.0 - 34.0 pg   MCHC 34.1 32.0 - 36.0 g/dL   RDW 13.0 11.5 - 14.5 %   Platelets 266 150 - 440 K/uL   Neutrophils Relative % 72 %   Neutro Abs 5.0 1.4 - 6.5 K/uL   Lymphocytes Relative 18 %   Lymphs Abs 1.2 1.0 - 3.6 K/uL   Monocytes Relative 7 %   Monocytes Absolute 0.4 0.2 - 0.9 K/uL   Eosinophils Relative 2 %   Eosinophils Absolute 0.1 0 - 0.7 K/uL    Basophils Relative 1 %   Basophils Absolute 0.1 0 - 0.1 K/uL    Comment: Performed at [facility], [address] [place], [place] [address]  Protime-INR     Status: None   Collection Time: 04/14/17 12:54 AM  Result Value Ref Range   Prothrombin Time 13.3 11.4 - 15.2 seconds   INR 1.02     Comment: Performed at [facility],  [place], [place] [address]  APTT     Status: None   Collection Time: 04/14/17 12:54 AM  Result Value Ref Range   aPTT 33 24 - 36 seconds    Comment: Performed at [facility], [address] [place], [place] [address]  Comprehensive metabolic panel     Status: Abnormal   Collection Time: 04/14/17 12:54 AM  Result Value Ref Range   Sodium 138 135 - 145 mmol/L   Potassium 3.3 (L) 3.5 - 5.1 mmol/L   Chloride 104 101 - 111 mmol/L   CO2 24 22 - 32 mmol/L   Glucose, Bld 146 (H) 65 - 99 mg/dL   BUN 16 6 - 20 mg/dL   Creatinine, Ser 0.60 0.44 - 1.00 mg/dL   Calcium 9.2 8.9 - 10.3 mg/dL   Total Protein 6.7 6.5 - 8.1 g/dL   Albumin 3.8 3.5 - 5.0 g/dL   AST 29 15 - 41 U/L   ALT 23 14 - 54 U/L   Alkaline Phosphatase 62 38 - 126 U/L   Total Bilirubin 0.5 0.3 - 1.2 mg/dL   GFR calc non Af Amer >60 >60 mL/min   GFR calc Af Amer >60 >60 mL/min    Comment: (NOTE) The eGFR has been calculated using the CKD EPI equation. This calculation has not been validated in all clinical situations. eGFR's persistently <60 mL/min signify possible Chronic Kidney Disease.    Anion gap 10 5 - 15    Comment: Performed at [facility], [place], [place] [address]  Troponin I     Status: Abnormal   Collection Time: 04/14/17 12:54 AM  Result Value Ref Range   Troponin I 0.04 (HH) <0.03 ng/mL    Comment: CRITICAL RESULT CALLED TO, READ BACK BY AND VERIFIED WITH STEPHANIE BROTHERS ON 04/14/17 AT [facility] Performed at [facility], [address] [place], [place] [address]   Lipid panel     Status:  Abnormal   Collection Time: 04/14/17 12:54 AM  Result Value Ref Range   Cholesterol 210 (H) 0 - 200 mg/dL   Triglycerides 177 (H) <150 mg/dL   HDL 48 >40 mg/dL   Total CHOL/HDL Ratio 4.4 RATIO   VLDL 35 0 - 40 mg/dL   LDL Cholesterol 127 (H) 0 - 99 mg/dL    Comment:        Total Cholesterol/HDL:CHD Risk Coronary Heart Disease Risk Table                     Men   Women  1/2 Average Risk   3.4   3.3  Average Risk       5.0   4.4  2 X Average Risk   9.6   7.1  3 X Average Risk  23.4   11.0        Use the calculated Patient Ratio above and the CHD Risk Table to determine the patient's CHD Risk.        ATP III CLASSIFICATION (LDL):  <100     mg/dL   Optimal  100-129  mg/dL   Near or Above                    Optimal  130-159  mg/dL   Borderline  160-189  mg/dL   High  >190     mg/dL   Very High Performed at [facility], [address], [place],  [address]  POCT Activated clotting time     Status: None   Collection Time: 04/14/17  1:30 AM  Result Value Ref Range   Activated Clotting Time 252 seconds  POCT Activated clotting time     Status: None   Collection Time: 04/14/17  1:48 AM  Result Value Ref Range   Activated Clotting Time 252 seconds  MRSA PCR Screening     Status: None   Collection Time: 04/14/17  2:27 AM  Result Value Ref Range   MRSA by PCR NEGATIVE NEGATIVE    Comment:        The GeneXpert MRSA Assay (FDA approved for NASAL specimens only), is one component of a comprehensive MRSA colonization surveillance program. It is not intended to diagnose MRSA infection nor to guide or monitor treatment for MRSA infections. Performed at [facility], [address] [place], [place] [address]   Glucose, capillary     Status: Abnormal   Collection Time: 04/14/17  2:29 AM  Result Value Ref Range   Glucose-Capillary 111 (H) 65 - 99 mg/dL  CBC     Status: Abnormal   Collection Time: 04/14/17  6:17 AM  Result Value Ref Range   WBC 6.0  3.6 - 11.0 K/uL   RBC 3.75 (L) 3.80 - 5.20 MIL/uL   Hemoglobin 11.7 (L) 12.0 - 16.0 g/dL   HCT 34.6 (L) 35.0 - 47.0 %   MCV 92.4 80.0 - 100.0 fL   MCH 31.2 26.0 - 34.0 pg   MCHC 33.8 32.0 - 36.0 g/dL   RDW 13.0 11.5 - 14.5 %   Platelets 235 150 - 440 K/uL    Comment: Performed at [facility], [address], [place], [place] [address]   Dg Chest Port 1 View  Result Date: 04/14/2017 CLINICAL DATA:  72 y/o F; status post STEMI and cardiac catheterization. EXAM: PORTABLE CHEST 1 VIEW COMPARISON:  08/27/2015 chest radiograph FINDINGS: Stable normal cardiac silhouette given projection and technique. Aortic atherosclerosis with calcification. Pulmonary vascular congestion. No focal consolidation. Platelike atelectasis at left lung base. No pleural effusion or pneumothorax. Transcutaneous pacing pads noted. Bones are unremarkable. IMPRESSION: Pulmonary vascular congestion. Stable cardiac silhouette. Aortic atherosclerosis. Electronically Signed   By: Kristine Garbe M.D.   On: 04/14/2017 05:20    Review of Systems  Constitutional: Negative for chills and fever.  HENT: Negative for sore throat and tinnitus.   Eyes: Negative for blurred vision and redness.  Respiratory: Positive for shortness of breath. Negative for cough.   Cardiovascular: Positive for chest pain. Negative for palpitations, orthopnea and PND.  Gastrointestinal: Negative for abdominal pain, diarrhea, nausea and vomiting.  Genitourinary: Negative for dysuria, frequency and urgency.  Musculoskeletal: Negative for joint pain and myalgias.  Skin: Negative for rash.       No lesions  Neurological: Negative for speech change, focal weakness and weakness.  Endo/Heme/Allergies: Does not bruise/bleed easily.       No temperature intolerance  Psychiatric/Behavioral: Negative for depression and suicidal ideas.    Blood pressure 122/60, pulse (!) 59, temperature 98.4 F (36.9 C), temperature source Oral, resp. rate  17, height '5\' 1"'$  (1.549 m), weight 92.7 kg (204 lb 5.9 oz), SpO2 100 %. Physical Exam  Vitals reviewed. Constitutional: She is oriented to person, place, and time. She appears well-developed and well-nourished. No distress.  HENT:  Head: Normocephalic and atraumatic.  Mouth/Throat: Oropharynx is clear and moist.  Eyes: Conjunctivae and EOM are normal. Pupils are equal, round, and reactive to light.  No scleral icterus.  Neck: Normal range of motion. Neck supple. No JVD present. No tracheal deviation present. No thyromegaly present.  Cardiovascular: Normal rate, regular rhythm and normal heart sounds. Exam reveals no gallop and no friction rub.  No murmur heard. Respiratory: Effort normal and breath sounds normal.  GI: Soft. Bowel sounds are normal. She exhibits no distension. There is no tenderness.  Genitourinary:  Genitourinary Comments: Deferred  Musculoskeletal: Normal range of motion. She exhibits no edema.  Lymphadenopathy:    She has no cervical adenopathy.  Neurological: She is alert and oriented to person, place, and time. No cranial nerve deficit. She exhibits normal muscle tone.  Skin: Skin is warm and dry. No rash noted. No erythema.  Psychiatric: She has a normal mood and affect. Her behavior is normal. Judgment and thought content normal.     Assessment/Plan This is a 72 year old female admitted for STEMI. 1.  STEMI: PCI to RCA.  Chest pain is resolved.  Continue anticoagulation per cardiology recommendations.  2.  Hypertension: Controlled; continue metoprolol and hydralazine.  Resume ACE inhibitor and hydrochlorothiazide at the discretion of cardiology service.  Labetalol as needed while hospitalized. 3.  Obesity: BMI 38; encouraged healthy diet and exercise/cardiac rehab 4.  History of PE: The patient underwent knee surgery on her opposite knee last week.  Consider therapeutic anticoagulation while hospitalized followed by antiplatelet therapy as an outpatient. 5.  DVT  prophylaxis: Plan as above 6.  GI prophylaxis: None The patient is a full code.  Time spent on admission orders and critical care approximately 45 minutes.  Discussed with cardiology service.  Harrie Foreman, MD 04/14/2017, 6:41 AM

## 2017-04-14 NOTE — Progress Notes (Signed)
Progress Note  Patient Name: Hailey Shields Date of Encounter: 04/14/2017  Primary Cardiologist: Thurmon Fair, MD  Subjective   No c/p or sob.  In good spirits.  Inpatient Medications    Scheduled Meds: . aspirin  81 mg Oral Daily  . docusate sodium  100 mg Oral BID  . [START ON 04/15/2017] enoxaparin (LOVENOX) injection  40 mg Subcutaneous Q24H  . ferrous sulfate  325 mg Oral TID WC  . lisinopril  20 mg Oral Daily  . metoprolol tartrate  12.5 mg Oral BID  . polyethylene glycol  17 g Oral BID  . sodium chloride  1 spray Each Nare Daily  . sodium chloride flush  3 mL Intravenous Q12H  . ticagrelor  90 mg Oral BID   Continuous Infusions: . sodium chloride    . sodium chloride     PRN Meds: sodium chloride, acetaminophen, methocarbamol, morphine injection, ondansetron (ZOFRAN) IV, sodium chloride flush   Vital Signs    Vitals:   04/14/17 1000 04/14/17 1100 04/14/17 1200 04/14/17 1300  BP:      Pulse: 71 69 79 78  Resp: (!) 22 19 19 17   Temp:      TempSrc:      SpO2: 98% 99% 97% 98%  Weight:      Height:        Intake/Output Summary (Last 24 hours) at 04/14/2017 1429 Last data filed at 04/14/2017 0900 Gross per 24 hour  Intake 65.5 ml  Output 1400 ml  Net -1334.5 ml   Filed Weights   04/14/17 0232  Weight: 204 lb 5.9 oz (92.7 kg)    Physical Exam   GEN: Well nourished, well developed, in no acute distress.  HEENT: Grossly normal.  Neck: Supple, no JVD, carotid bruits, or masses. Cardiac: RRR, 2/6 SEM RUSB, no rubs, or gallops. No clubbing, cyanosis, edema.  Radials/DP/PT 2+ and equal bilaterally. R wrist cath site w/o bleeding/bruit/hematoma. Respiratory:  Respirations regular and unlabored, clear to auscultation bilaterally. GI: Soft, nontender, nondistended, BS + x 4. MS: no deformity or atrophy. Skin: warm and dry, no rash. Neuro:  Strength and sensation are intact. Psych: AAOx3.  Normal affect.  Labs    Chemistry Recent Labs  Lab  04/08/17 0540 04/14/17 0054 04/14/17 0617  NA 140 138 136  K 3.7 3.3* 3.7  CL 108 104 108  CO2 25 24 23   GLUCOSE 123* 146* 115*  BUN 10 16 12   CREATININE 0.46 0.60 0.60  CALCIUM 8.6* 9.2 8.5*  PROT  --  6.7  --   ALBUMIN  --  3.8  --   AST  --  29  --   ALT  --  23  --   ALKPHOS  --  62  --   BILITOT  --  0.5  --   GFRNONAA >60 >60 >60  GFRAA >60 >60 >60  ANIONGAP 7 10 5      Hematology Recent Labs  Lab 04/08/17 0540 04/14/17 0054 04/14/17 0617  WBC 6.5 6.8 6.0  RBC 3.68* 3.86 3.75*  HGB 11.4* 12.1 11.7*  HCT 34.5* 35.6 34.6*  MCV 93.8 92.1 92.4  MCH 31.0 31.4 31.2  MCHC 33.0 34.1 33.8  RDW 12.9 13.0 13.0  PLT 220 266 235    Cardiac Enzymes Recent Labs  Lab 04/14/17 0054 04/14/17 0617 04/14/17 1220  TROPONINI 0.04* 7.77* 6.78*      Radiology    Dg Chest Port 1 View  Result Date: 04/14/2017 CLINICAL DATA:  72 y/o F; status post STEMI and cardiac catheterization. EXAM: PORTABLE CHEST 1 VIEW COMPARISON:  08/27/2015 chest radiograph FINDINGS: Stable normal cardiac silhouette given projection and technique. Aortic atherosclerosis with calcification. Pulmonary vascular congestion. No focal consolidation. Platelike atelectasis at left lung base. No pleural effusion or pneumothorax. Transcutaneous pacing pads noted. Bones are unremarkable. IMPRESSION: Pulmonary vascular congestion. Stable cardiac silhouette. Aortic atherosclerosis. Electronically Signed   By: Mitzi HansenLance  Furusawa-Stratton M.D.   On: 04/14/2017 05:20    Telemetry    RSR - Personally Reviewed  ECG    RSR, 80, LAD, LVH, nonspec T changes. - Personally Reviewed  Cardiac Studies   Cardiac Catheterization and Percutaneous Coronary Intervention 2.27.2019  Left Main  Vessel is large.  Dist LM lesion 20% stenosed  Dist LM lesion is 20% stenosed.  Left Anterior Descending  Vessel is moderate in size.  Ost LAD lesion 30% stenosed  Ost LAD lesion is 30% stenosed.  Prox LAD lesion 60% stenosed  Prox  LAD lesion is 60% stenosed.  First Diagonal Branch  Vessel is small in size.  Ost 1st Diag to 1st Diag lesion 50% stenosed  Ost 1st Diag to 1st Diag lesion is 50% stenosed.  First Septal Branch  Vessel is moderate in size.  Second Diagonal Branch  Vessel is moderate in size.  Left Circumflex  Vessel is large.  Ost Cx lesion 30% stenosed  Ost Cx lesion is 30% stenosed.  First Obtuse Marginal Branch  Vessel is moderate in size. There is mild disease in the vessel.  Second Obtuse Marginal Branch  Vessel is large in size.  Third Obtuse Marginal Branch  Vessel is moderate in size. There is mild disease in the vessel.  Right Coronary Artery  Prox RCA to Mid RCA lesion 100% stenosed  Prox RCA to Mid RCA lesion is 100% stenosed. The lesion is thrombotic. **TREATED WITH A 3.5 X 38 MM RESOLUTE ONYX DES**  Mid RCA lesion 50% stenosed  Mid RCA lesion is 50% stenosed.  Right Posterior Descending Artery  Collaterals  RPDA filled by collaterals from 2nd Sept.   Left Ventricle The left ventricular size is normal. The left ventricular systolic function is normal. LV end diastolic pressure is normal. The left ventricular ejection fraction is 55-65% by visual estimate. There are LV function abnormalities due to segmental dysfunction. There is trivial (1+) mitral regurgitation.    Patient Profile     72 y.o. female with a hx of hypertension, hyperlipidemia (intolerant of statins), mild aortic stenosis, TIA, DVT/PE following remote knee surgery, and ovarian cancer, who was admitted early 2/27 with inferior STEMI requiring cath/RCA DES.  Assessment & Plan    1.  Acute inferior STEMI/CAD:  S/p cath/PCI/DES to the RCA in setting of 100% proximal/mid stenosis.  Trop peaked @ 7.77 and is now trending down.  No chest pain or dyspnea.  ASA,  blocker, acei, brilinta.. No statin in setting of prior intolerance.  Will add zetia.  2.  Essential HTN:  Stable on  blocker and acei.  3.  HL:  Previously  intolerant to statins.  Will add zetia for now.  Consider PCSK9i as outpt.  LDL 127.  4.  Mild Ao Stenosis:  Echo pending.  5.  S/p R TKA:  2/19.  Just d/c'd 2/21.  It was her understanding that she would go on eliquis 5 bid @ d/c, though this was not Rx @ d/c (not in AVS).  She went ahead and started taking it on her own (has  an old bottle).  Dr. Erin Hearing preop note recs aggressive DVT prophylaxis, though @ 5 bid, she is on a therapeutic dose.  Will reach out to Dr. Charlann Boxer for clarification, given need for ASA/brilinta.  Signed, Nicolasa Ducking, NP  04/14/2017, 2:29 PM    For questions or updates, please contact   Please consult [email or web address] for contact info under Cardiology/STEMI.

## 2017-04-14 NOTE — Progress Notes (Signed)
Troponin level of 7.77 called by lab.  Passed in report to [place], rn.

## 2017-04-15 MED ORDER — APIXABAN 2.5 MG PO TABS
2.5000 mg | ORAL_TABLET | Freq: Two times a day (BID) | ORAL | Status: DC
  Administered 2017-04-15 – 2017-04-16 (×3): 2.5 mg via ORAL
  Filled 2017-04-15 (×3): qty 1

## 2017-04-15 MED ORDER — MORPHINE SULFATE (PF) 2 MG/ML IV SOLN
1.0000 mg | INTRAVENOUS | Status: DC | PRN
  Administered 2017-04-15: 2 mg via INTRAVENOUS
  Filled 2017-04-15: qty 1

## 2017-04-15 MED ORDER — POTASSIUM CHLORIDE CRYS ER 20 MEQ PO TBCR
40.0000 meq | EXTENDED_RELEASE_TABLET | Freq: Once | ORAL | Status: AC
  Administered 2017-04-15: 40 meq via ORAL
  Filled 2017-04-15: qty 2

## 2017-04-15 NOTE — Progress Notes (Signed)
RN notified Dr Belia HemanKasa during rounds that patient may need PT as had knee surgery feb 19th and was having virtual PT at home when patient had bout of chest pain.  MD gave order for PT.

## 2017-04-15 NOTE — Progress Notes (Signed)
RN notified Dr Sung AmabileSimonds for patient complaint of knee pain 9/10 not improved with tylenol, her IV morphine order was discontinued, can we restart.  Dr Bard HerbertSimmonds states "you can reorder morphine as it was previously ordered"

## 2017-04-15 NOTE — Progress Notes (Signed)
DX-STEMI  VS reviewed and stable  Follow up cardiology recs Transfer to gen med floor    Blease Capaldi Santiago Gladavid Demarlo Riojas, M.D.  [facility]  Medical Director [facility] Medical Director [facility] Cardio-Pulmonary Department

## 2017-04-15 NOTE — Progress Notes (Signed)
   04/15/17 1025  Clinical Encounter Type  Visited With Patient  Visit Type Follow-up  Spiritual Encounters  Spiritual Needs Emotional;Prayer   Chaplain met with patient.  Patient reported on family engagement and improvements in health.  Discussion of faith as a support in patient's life, chaplain and patient prayed together.

## 2017-04-15 NOTE — Evaluation (Signed)
Physical Therapy Evaluation Patient Details Name: Hailey Shields MRN: [id number] DOB: 18-Dec-1945 Today's Date: 04/15/2017   History of Present Illness  Pt is a 72 yo F with past medical history of DVT and PE status R TKA on 04/06/2017 presents to the emergency department complaining of chest pain. The patient felt some shortness of breath during her chest pain as well. EKG in the emergency department showed ST elevations in inferior leads which initiated the STEMI protocol which resulted in PCI and drug eluting stent to the RCA. The patient tolerated her procedure well and was admitted to the ICU with their management per the hospitalist team.  Assessment includes: STEMI with PCI to the RCA with DES, HTN, and recent R knee TKA.    Clinical Impression  Pt presents with deficits in strength, transfers, mobility, gait, R knee ROM, balance, and activity tolerance but overall performed well during the session.  Pt was Mod Ind with bed mobility tasks with extra time and effort required.  Pt was CGA with transfers with good stability and control.  Pt able to amb 40' with HW and CGA with slow, step-to gait pattern but was steady without LOB.  Pt's SpO2 and HR WNL throughout session with no adverse symptoms reported.  Pt will benefit from HHPT services to address recent R TKA and cardiac rehab to address recent STEMI upon discharge to safely address above deficits for decreased caregiver assistance and eventual return to PLOF.      Follow Up Recommendations Home health PT and cardiac rehab    Equipment Recommendations  None recommended by PT    Recommendations for Other Services       Precautions / Restrictions Precautions Precautions: Fall Precaution Comments: Radial artery compression device to the RUE 04/14/2017 Restrictions Weight Bearing Restrictions: Yes RLE Weight Bearing: Weight bearing as tolerated Other Position/Activity Restrictions: Limit WB through RUE per radial artery compression  device protocol      Mobility  Bed Mobility Overal bed mobility: Modified Independent Bed Mobility: Supine to Sit;Sit to Supine     Supine to sit: Modified independent (Device/Increase time) Sit to supine: Modified independent (Device/Increase time)   General bed mobility comments: Extra time and effort during sup to/from sit but no physical assistance required  Transfers Overall transfer level: Needs assistance Equipment used: Hemi-walker Transfers: Sit to/from Stand Sit to Stand: Min guard         General transfer comment: Min verbal cues for sequencing during transfers but pt steady with good control  Ambulation/Gait Ambulation/Gait assistance: Min guard Ambulation Distance (Feet): 40 Feet Assistive device: Hemi-walker Gait Pattern/deviations: Step-to pattern   Gait velocity interpretation: Below normal speed for age/gender General Gait Details: Pt ambulated with slow, step-to gait pattern but was steady without LOB with SpO2 and HR WNL throughout session and no adverse symptoms noted.   Stairs            Wheelchair Mobility    Modified Rankin (Stroke Patients Only)       Balance Overall balance assessment: Needs assistance Sitting-balance support: Feet unsupported;Single extremity supported Sitting balance-Leahy Scale: Normal     Standing balance support: Single extremity supported;During functional activity Standing balance-Leahy Scale: Good                               Pertinent Vitals/Pain Pain Assessment: 0-10 Pain Score: 4  Pain Location: R knee Pain Descriptors / Indicators: Aching;Sore Pain Intervention(s): Premedicated before session;Monitored  during session;Limited activity within patient's tolerance    Home Living Family/patient expects to be discharged to:: Private residence Living Arrangements: Alone Available Help at Discharge: Family;Friend(s);Available PRN/intermittently Type of Home: House Home Access: Ramped  entrance     Home Layout: One level Home Equipment: Walker - 2 wheels;Cane - single point      Prior Function Level of Independence: Independent with assistive device(s)         Comments: Pt was Ind with amb without AD indoors and with SPC outdoors prior to recent TKA and has used a RW since the Sx; Ind with ADLs with one fall in the last year soon after returning home from recent TKA     Hand Dominance   Dominant Hand: Right    Extremity/Trunk Assessment   Upper Extremity Assessment Upper Extremity Assessment: Overall WFL for tasks assessed(Education provided to limit WB through RUE)    Lower Extremity Assessment Lower Extremity Assessment: Generalized weakness;RLE deficits/detail RLE: Unable to fully assess due to pain    Cervical / Trunk Assessment Cervical / Trunk Assessment: Normal  Communication   Communication: No difficulties  Cognition Arousal/Alertness: Awake/alert Behavior During Therapy: WFL for tasks assessed/performed Overall Cognitive Status: Within Functional Limits for tasks assessed                                        General Comments      Exercises Total Joint Exercises Ankle Circles/Pumps: AROM;Both;10 reps Quad Sets: Strengthening;Both;10 reps Gluteal Sets: Strengthening;Both;10 reps Long Arc Quad: AROM;Right;10 reps;15 reps Knee Flexion: AROM;Right;10 reps;15 reps Goniometric ROM: R knee AROM: 10-85 deg but limited by pain Marching in Standing: AROM;Both;10 reps Other Exercises Other Exercises: Positioning education provided to encourage R knee ext in supine Other Exercises: HEP education provided for BLE APs, QS, GS, and LAQs x 10 each 5-6x/day   Assessment/Plan    PT Assessment Patient needs continued PT services  PT Problem List Decreased strength;Decreased range of motion;Decreased activity tolerance;Decreased balance;Decreased knowledge of use of DME       PT Treatment Interventions DME instruction;Gait  training;Functional mobility training;Balance training;Therapeutic exercise;Therapeutic activities;Patient/family education    PT Goals (Current goals can be found in the Care Plan section)  Acute Rehab PT Goals Patient Stated Goal: To walk better PT Goal Formulation: With patient Time For Goal Achievement: 04/28/17 Potential to Achieve Goals: Good    Frequency 7X/week   Barriers to discharge        Co-evaluation               AM-PAC PT "6 Clicks" Daily Activity  Outcome Measure Difficulty turning over in bed (including adjusting bedclothes, sheets and blankets)?: A Little Difficulty moving from lying on back to sitting on the side of the bed? : A Little Difficulty sitting down on and standing up from a chair with arms (e.g., wheelchair, bedside commode, etc,.)?: Unable Help needed moving to and from a bed to chair (including a wheelchair)?: A Little Help needed walking in hospital room?: A Little Help needed climbing 3-5 steps with a railing? : A Little 6 Click Score: 16    End of Session Equipment Utilized During Treatment: Gait belt Activity Tolerance: Patient tolerated treatment well Patient left: in bed;with bed alarm set;with call bell/phone within reach Nurse Communication: Mobility status PT Visit Diagnosis: Other abnormalities of gait and mobility (R26.89);Muscle weakness (generalized) (M62.81);Pain Pain - Right/Left: Right Pain -  part of body: Knee    Time: 1412-1446 PT Time Calculation (min) (ACUTE ONLY): 34 min   Charges:   PT Evaluation $PT Eval Low Complexity: 1 Low PT Treatments $Therapeutic Exercise: 8-22 mins   PT G Codes:        Elly Modena. Scott Simrin Vegh PT, DPT 04/15/17, 5:15 PM

## 2017-04-15 NOTE — Progress Notes (Addendum)
Progress Note  Patient Name: Hailey Shields Date of Encounter: 04/15/2017  Primary Cardiologist: Thurmon FairMihai Croitoru, MD  Subjective   No chest pain or sob.  R knee is sore - 'dull ache.'  Inpatient Medications    Scheduled Meds: . apixaban  2.5 mg Oral BID  . aspirin  81 mg Oral Daily  . docusate sodium  100 mg Oral BID  . ezetimibe  10 mg Oral Daily  . ferrous sulfate  325 mg Oral TID WC  . lisinopril  20 mg Oral Daily  . metoprolol tartrate  12.5 mg Oral BID  . polyethylene glycol  17 g Oral BID  . sodium chloride  1 spray Each Nare Daily  . sodium chloride flush  3 mL Intravenous Q12H  . ticagrelor  90 mg Oral BID   Continuous Infusions: . sodium chloride    . sodium chloride     PRN Meds: sodium chloride, acetaminophen, methocarbamol, morphine injection, ondansetron (ZOFRAN) IV, sodium chloride flush   Vital Signs    Vitals:   04/15/17 0800 04/15/17 0900 04/15/17 1000 04/15/17 1100  BP: (!) 158/70 128/66 116/68 115/64  Pulse: 77 75 65 61  Resp: 18 19 19 18   Temp:    (!) 97.4 F (36.3 C)  TempSrc:    Axillary  SpO2: 99% 99% 98% 95%  Weight:      Height:        Intake/Output Summary (Last 24 hours) at 04/15/2017 1202 Last data filed at 04/15/2017 0900 Gross per 24 hour  Intake 360 ml  Output -  Net 360 ml   Filed Weights   04/14/17 0232 04/15/17 0134  Weight: 204 lb 5.9 oz (92.7 kg) 205 lb 4 oz (93.1 kg)    Physical Exam   GEN: Well nourished, well developed, in no acute distress.  HEENT: Grossly normal.  Neck: Supple, no JVD, carotid bruits, or masses. Cardiac: RRR, 2/6 SEM @ RUSB, no rubs, or gallops. No clubbing, cyanosis, edema.  Radials/DP/PT 2+ and equal bilaterally. R radial cath site w/o bleeding/bruit/hematoma. Respiratory:  Respirations regular and unlabored, clear to auscultation bilaterally. GI: Soft, nontender, nondistended, BS + x 4. MS: no deformity or atrophy. Skin: warm and dry, no rash. Neuro:  Strength and sensation are  intact. Psych: AAOx3.  Normal affect.  Labs    Chemistry Recent Labs  Lab 04/14/17 0054 04/14/17 0617  NA 138 136  K 3.3* 3.7  CL 104 108  CO2 24 23  GLUCOSE 146* 115*  BUN 16 12  CREATININE 0.60 0.60  CALCIUM 9.2 8.5*  PROT 6.7  --   ALBUMIN 3.8  --   AST 29  --   ALT 23  --   ALKPHOS 62  --   BILITOT 0.5  --   GFRNONAA >60 >60  GFRAA >60 >60  ANIONGAP 10 5     Hematology Recent Labs  Lab 04/14/17 0054 04/14/17 0617  WBC 6.8 6.0  RBC 3.86 3.75*  HGB 12.1 11.7*  HCT 35.6 34.6*  MCV 92.1 92.4  MCH 31.4 31.2  MCHC 34.1 33.8  RDW 13.0 13.0  PLT 266 235    Cardiac Enzymes Recent Labs  Lab 04/14/17 0054 04/14/17 0617 04/14/17 1220 04/14/17 1801  TROPONINI 0.04* 7.77* 6.78* 7.18*      Radiology    Dg Chest Port 1 View  Result Date: 04/14/2017 CLINICAL DATA:  72 y/o F; status post STEMI and cardiac catheterization. EXAM: PORTABLE CHEST 1 VIEW COMPARISON:  08/27/2015 chest  radiograph FINDINGS: Stable normal cardiac silhouette given projection and technique. Aortic atherosclerosis with calcification. Pulmonary vascular congestion. No focal consolidation. Platelike atelectasis at left lung base. No pleural effusion or pneumothorax. Transcutaneous pacing pads noted. Bones are unremarkable. IMPRESSION: Pulmonary vascular congestion. Stable cardiac silhouette. Aortic atherosclerosis. Electronically Signed   By: Mitzi Hansen M.D.   On: 04/14/2017 05:20    Telemetry    RSR - Personally Reviewed  Cardiac Studies   Cardiac Catheterization and Percutaneous Coronary Intervention 2.27.2019      Left Main  Vessel is large.  Dist LM lesion 20% stenosed  Dist LM lesion is 20% stenosed.  Left Anterior Descending  Vessel is moderate in size.  Ost LAD lesion 30% stenosed  Ost LAD lesion is 30% stenosed.  Prox LAD lesion 60% stenosed  Prox LAD lesion is 60% stenosed.  First Diagonal Branch  Vessel is small in size.  Ost 1st Diag to 1st Diag lesion  50% stenosed  Ost 1st Diag to 1st Diag lesion is 50% stenosed.  First Septal Branch    Vessel is moderate in size.    Second Diagonal Branch    Vessel is moderate in size.    Left Circumflex   Vessel is large.   Ost Cx lesion 30% stenosed   Ost Cx lesion is 30% stenosed.   First Obtuse Marginal Branch  Vessel is moderate in size. There is mild disease in the vessel.  Second Obtuse Marginal Branch  Vessel is large in size.  Third Obtuse Marginal Branch  Vessel is moderate in size. There is mild disease in the vessel.  Right Coronary Artery  Prox RCA to Mid RCA lesion 100% stenosed  Prox RCA to Mid RCA lesion is 100% stenosed. The lesion is thrombotic. **TREATED WITH A 3.5 X 38 MM RESOLUTE ONYX DES**  Mid RCA lesion 50% stenosed  Mid RCA lesion is 50% stenosed.  Right Posterior Descending Artery  Collaterals  RPDA filled by collaterals from 2nd Sept.   Left Ventricle The left ventricular size is normal. The left ventricular systolic function is normal. LV end diastolic pressure is normal. The left ventricular ejection fraction is 55-65% by visual estimate. There are LV function abnormalities due to segmental dysfunction. There is trivial (1+) mitral regurgitation.  _____________  2D Echocardiogram 2.27.2019  Study Conclusions   - Left ventricle: The cavity size was normal. Systolic function was   normal. The estimated ejection fraction was in the range of 50%   to 55%. Hypokinesis of the basal to mid inferior wall. Features   are consistent with a pseudonormal left ventricular filling   pattern, with concomitant abnormal relaxation and increased   filling pressure (grade 2 diastolic dysfunction). - Aortic valve: Transvalvular velocity was increased. There was   mild stenosis. Peak velocity (S): 235 cm/s. Mean gradient (S): 14   mm Hg. Peak gradient (S): 22 mm Hg. - Mitral valve: There was mild regurgitation. - Left atrium: The atrium was mildly dilated. - Right ventricle:  The cavity size was mildly dilated. Wall   thickness was normal. Systolic function was normal. - Pulmonary arteries: Systolic pressure was within the normal   range. _____________   Patient Profile     72 y.o.femalewith a hx of hypertension, hyperlipidemia (intolerant of statins), mild aortic stenosis, TIA, DVT/PE following remote knee surgery, and ovarian cancer,who was admitted early 2/27 with inferior STEMI requiring cath/RCA DES.  Assessment & Plan    1.  Acute inferior STEMI/CAD:  S/p PCI/DES to the  RCA in the setting of 100% prox/mid occlusion.  Moderate residual dzs.  No c/p or dyspnea overnight.  Cont ASA,  blocker, acei, zetia (intolerant to multiple statins).  As she will need DVT prophylactic dosing eliquis (discussed w/Ortho), we will plan to load w/ plavix tomorrow prior to discharge. Ok to tx to floor.  2.  Essential HTN:  Stable on  blocker and acei.  3.  HL:  LDL 127.  Intolerant to statins.  Zetia started yesterday.  Will likely need PCSK9i as outpt.  4.  Mild Ao Stenosis:  Echo yesterday - still only mild AS.  5.  S/P R TKA:  Performed 2/19 by Dr. Charlann Boxer.  D/c'd 2/21.  I reached out to Dr. Charlann Boxer yesterday and heard back from his office.  Given h/o DVT and PE following prior TKA, he would like her to be on eliquis 2.5 bid (she had been taking 5 bid, since she had those tabs @ home).  Discussed with Dr. Okey Dupre - will plan to transition from brilinta to plavix tomorrow.  Starting eliquis today.  She has been having right knee pain.  Pain mgmt per IM.  Needs PT to see her here.      Signed, Nicolasa Ducking, NP  04/15/2017, 12:02 PM    For questions or updates, please contact   Please consult [email or web address] for contact info under Cardiology/STEMI.   Attending Note Patient seen and examined, agree with detailed note above,  Patient presentation and plan discussed on rounds.   Feeling well this morning,  Pain in right knee Denies shortness of breath or chest pain  On  physical exam normal JVP, heart sounds regular no murmurs appreciated lungs clear to auscultation bilaterally abdomen soft nontender nondistended, bandage right knee no significant leg edema  Lab work reviewed showing troponin VII 0.18 yesterday creatinine 0.6 potassium 3.7 hematocrit 34.6  A/P: STEMI Successful stent to RCA,  Plan is to continue aspirin with Brilinta today We will start low-dose Eliquis 2.5 twice daily for DVT prophylaxis given knee surgery change to Plavix load tomorrow with low-dose aspirin, hold Brilinta tomorrow   Total knee replacement on the right Eliquis 2.5 twice daily for DVT prophylaxis Recommend placement of compression hose today  Greater than 50% was spent in counseling and coordination of care with patient Total encounter time 25 minutes or more   Signed: Dossie Arbour  M.D., Ph.D. [facility]

## 2017-04-15 NOTE — Progress Notes (Signed)
[facility] - [place] [facility]   PATIENT NAME: Ziyon Cedotal    MR#:  [id number]  DATE OF BIRTH:  [birth year]  SUBJECTIVE:  CHIEF COMPLAINT:  No chief complaint on file.  No CP/SOB  REVIEW OF SYSTEMS:    Review of Systems  Constitutional: Positive for malaise/fatigue. Negative for chills and fever.  HENT: Negative for sore throat.   Eyes: Negative for blurred vision, double vision and pain.  Respiratory: Negative for cough, hemoptysis, shortness of breath and wheezing.   Cardiovascular: Negative for chest pain, palpitations, orthopnea and leg swelling.  Gastrointestinal: Negative for abdominal pain, constipation, diarrhea, heartburn, nausea and vomiting.  Genitourinary: Negative for dysuria and hematuria.  Musculoskeletal: Negative for back pain and joint pain.  Skin: Negative for rash.  Neurological: Positive for weakness. Negative for sensory change, speech change, focal weakness and headaches.  Endo/Heme/Allergies: Does not bruise/bleed easily.  Psychiatric/Behavioral: Negative for depression. The patient is not nervous/anxious.     DRUG ALLERGIES:   Allergies  Allergen Reactions  . Codeine Other (See Comments)    Hallucinations  . Hydrocodone Other (See Comments)    hallucinations  . Oxycodone Other (See Comments)    hallucinations  . Prednisone Other (See Comments)    Increase heart rate  . Statins Other (See Comments)    Myalgia, weakness   . Tramadol Nausea And Vomiting  . Zithromax [Azithromycin] Other (See Comments)    Increase heart rate    VITALS:  Blood pressure 116/68, pulse 65, temperature 99.1 F (37.3 C), temperature source Oral, resp. rate 19, height 5\' 1"  (1.549 m), weight 93.1 kg (205 lb 4 oz), SpO2 98 %.  PHYSICAL EXAMINATION:   Physical Exam  GENERAL:  72 y.o.-year-old patient lying in the bed with no acute distress.  EYES: Pupils equal, round, reactive to light and accommodation. No scleral icterus. Extraocular muscles  intact.  HEENT: Head atraumatic, normocephalic. Oropharynx and nasopharynx clear.  NECK:  Supple, no jugular venous distention. No thyroid enlargement, no tenderness.  LUNGS: Normal breath sounds bilaterally, no wheezing, rales, rhonchi. No use of accessory muscles of respiration.  CARDIOVASCULAR: S1, S2 normal. No murmurs, rubs, or gallops.  ABDOMEN: Soft, nontender, nondistended. Bowel sounds present. No organomegaly or mass.  EXTREMITIES: No cyanosis, clubbing or edema b/l.    NEUROLOGIC: Cranial nerves II through XII are intact. No focal Motor or sensory deficits b/l.   PSYCHIATRIC: The patient is alert and oriented x 3.  SKIN: No obvious rash, lesion, or ulcer.   LABORATORY PANEL:   CBC Recent Labs  Lab 04/14/17 0617  WBC 6.0  HGB 11.7*  HCT 34.6*  PLT 235   ------------------------------------------------------------------------------------------------------------------ Chemistries  Recent Labs  Lab 04/14/17 0054 04/14/17 0617  NA 138 136  K 3.3* 3.7  CL 104 108  CO2 24 23  GLUCOSE 146* 115*  BUN 16 12  CREATININE 0.60 0.60  CALCIUM 9.2 8.5*  MG  --  1.8  AST 29  --   ALT 23  --   ALKPHOS 62  --   BILITOT 0.5  --    ------------------------------------------------------------------------------------------------------------------  Cardiac Enzymes Recent Labs  Lab 04/14/17 1801  TROPONINI 7.18*   ------------------------------------------------------------------------------------------------------------------  RADIOLOGY:  Dg Chest Port 1 View  Result Date: 04/14/2017 CLINICAL DATA:  72 y/o F; status post STEMI and cardiac catheterization. EXAM: PORTABLE CHEST 1 VIEW COMPARISON:  08/27/2015 chest radiograph FINDINGS: Stable normal cardiac silhouette given projection and technique. Aortic atherosclerosis with calcification. Pulmonary vascular congestion. No focal consolidation.  Platelike atelectasis at left lung base. No pleural effusion or pneumothorax.  Transcutaneous pacing pads noted. Bones are unremarkable. IMPRESSION: Pulmonary vascular congestion. Stable cardiac silhouette. Aortic atherosclerosis. Electronically Signed   By: Mitzi HansenLance  Furusawa-Stratton M.D.   On: 04/14/2017 05:20     ASSESSMENT AND PLAN:   * STEMI with PCI to RCA with DES On ASA, Brillinta, Zetia. Discussed with Dr. Mariah MillingGollan Cardiac rehab referral at discharge  * HTN Continue home meds  * Recent knee surgery PT consulted  Likely d/c in AM  All the records are reviewed and case discussed with Care Management/Social Worker Management plans discussed with the patient, family and they are in agreement.  CODE STATUS: FULL CODE  DVT Prophylaxis: SCDs  TOTAL TIME TAKING CARE OF THIS PATIENT: 30 minutes.   POSSIBLE D/C IN 1-2 DAYS, DEPENDING ON CLINICAL CONDITION.  Molinda BailiffSrikar R Juliannah Ohmann M.D on 04/15/2017 at 10:50 AM  Between 7am to 6pm - Pager - [phone]  After 6pm go to [email or web address] - password [email or web address]  [facility]  Office  [phone]  CC: Primary care physician; Olen CordialBissette, Steven, MD  Note: This dictation was prepared with Dragon dictation along with smaller phrase technology. Any transcriptional errors that result from this process are unintentional.

## 2017-04-16 ENCOUNTER — Telehealth: Payer: Self-pay | Admitting: Internal Medicine

## 2017-04-16 ENCOUNTER — Encounter: Payer: Self-pay | Admitting: Nurse Practitioner

## 2017-04-16 LAB — BASIC METABOLIC PANEL
ANION GAP: 9 (ref 5–15)
BUN: 10 mg/dL (ref 6–20)
CALCIUM: 9.4 mg/dL (ref 8.9–10.3)
CO2: 23 mmol/L (ref 22–32)
Chloride: 104 mmol/L (ref 101–111)
Creatinine, Ser: 0.5 mg/dL (ref 0.44–1.00)
GFR calc Af Amer: >60 mL/min (ref >60)
GLUCOSE: 110 mg/dL — AB (ref 65–99)
Potassium: 4.1 mmol/L (ref 3.5–5.1)
Sodium: 136 mmol/L (ref 135–145)

## 2017-04-16 LAB — CBC
HEMATOCRIT: 37.2 % (ref 35.0–47.0)
Hemoglobin: 12.5 g/dL (ref 12.0–16.0)
MCH: 31.3 pg (ref 26.0–34.0)
MCHC: 33.5 g/dL (ref 32.0–36.0)
MCV: 93.3 fL (ref 80.0–100.0)
PLATELETS: 233 10*3/uL (ref 150–440)
RBC: 3.98 MIL/uL (ref 3.80–5.20)
RDW: 13.2 % (ref 11.5–14.5)
WBC: 5.5 10*3/uL (ref 3.6–11.0)

## 2017-04-16 MED ORDER — METOPROLOL TARTRATE 25 MG PO TABS
25.0000 mg | ORAL_TABLET | Freq: Two times a day (BID) | ORAL | Status: DC
  Administered 2017-04-16: 25 mg via ORAL
  Filled 2017-04-16: qty 1

## 2017-04-16 MED ORDER — APIXABAN 2.5 MG PO TABS: 2.5000 mg | ORAL_TABLET | Freq: Two times a day (BID) | ORAL | 0 refills | Status: DC

## 2017-04-16 MED ORDER — CLOPIDOGREL BISULFATE 75 MG PO TABS
300.0000 mg | ORAL_TABLET | Freq: Once | ORAL | Status: AC
  Administered 2017-04-16: 300 mg via ORAL
  Filled 2017-04-16: qty 4

## 2017-04-16 MED ORDER — CLOPIDOGREL BISULFATE 75 MG PO TABS: 75.0000 mg | ORAL_TABLET | Freq: Every day | ORAL | 0 refills | Status: DC

## 2017-04-16 MED ORDER — METOPROLOL TARTRATE 25 MG PO TABS: 25.0000 mg | ORAL_TABLET | Freq: Two times a day (BID) | ORAL | 0 refills | Status: DC

## 2017-04-16 MED ORDER — LISINOPRIL 20 MG PO TABS: 20.0000 mg | ORAL_TABLET | Freq: Every day | ORAL | 0 refills | Status: DC

## 2017-04-16 NOTE — Care Management (Signed)
Email from Chong SicilianJill Lauer with [employer] bundle team: "We very much appreciate the concern following a cardiac event, but I will be in contact with this patient once she dc's home. She absolutely has rights under the bundle program, but Dr. Charlann Boxerlin wants her to continue with the Virtual PT program, not only for the benefit of physical therapy, but also because the Physical Therapist associated with that program is available to patients 24/7. It's an additional set of eyes and ears that won't be available with HHC".

## 2017-04-16 NOTE — Progress Notes (Signed)
Progress Note  Patient Name: Hailey Shields Date of Encounter: 04/16/2017  Primary Cardiologist: Thurmon Fair, MD  Subjective   No c/p or sob.  Slept poorly b/c bed hard and back hurts.  R knee also hurts.  She's been reluctant to take PO pain meds but IV morphine has helped.  Inpatient Medications    Scheduled Meds: . apixaban  2.5 mg Oral BID  . aspirin  81 mg Oral Daily  . docusate sodium  100 mg Oral BID  . ezetimibe  10 mg Oral Daily  . ferrous sulfate  325 mg Oral TID WC  . lisinopril  20 mg Oral Daily  . metoprolol tartrate  12.5 mg Oral BID  . polyethylene glycol  17 g Oral BID  . sodium chloride  1 spray Each Nare Daily  . sodium chloride flush  3 mL Intravenous Q12H  . ticagrelor  90 mg Oral BID   Continuous Infusions: . sodium chloride    . sodium chloride     PRN Meds: sodium chloride, acetaminophen, methocarbamol, morphine injection, ondansetron (ZOFRAN) IV, sodium chloride flush   Vital Signs    Vitals:   04/15/17 1925 04/15/17 2000 04/15/17 2025 04/16/17 0511  BP:  123/66 (!) 150/57 136/67  Pulse: 80 67 73 76  Resp: (!) 25 16 19 19   Temp: 98.3 F (36.8 C)  98.3 F (36.8 C) 98.3 F (36.8 C)  TempSrc: Oral  Oral Oral  SpO2: 99% 97% 99% 98%  Weight:   203 lb 1.6 oz (92.1 kg) 201 lb 11.2 oz (91.5 kg)  Height:   5\' 1"  (1.549 m)     Intake/Output Summary (Last 24 hours) at 04/16/2017 0826 Last data filed at 04/16/2017 0143 Gross per 24 hour  Intake 360 ml  Output 2250 ml  Net -1890 ml   Filed Weights   04/15/17 0134 04/15/17 2025 04/16/17 0511  Weight: 205 lb 4 oz (93.1 kg) 203 lb 1.6 oz (92.1 kg) 201 lb 11.2 oz (91.5 kg)    Physical Exam   GEN: Well nourished, well developed, in no acute distress.  HEENT: Grossly normal.  Neck: Supple, no JVD, carotid bruits, or masses. Cardiac: RRR, 2/6 SEM, no murmurs, rubs, or gallops. No clubbing, cyanosis, edema.  Radials/DP/PT 2+ and equal bilaterally.  R radial cath site w/o  bleeding/bruit/hematoma. Respiratory:  Respirations regular and unlabored, clear to auscultation bilaterally. GI: Soft, nontender, nondistended, BS + x 4. MS: R knee swollen but no erythema or bleeding @ surgical site. Skin: warm and dry, no rash. Neuro:  Strength and sensation are intact. Psych: AAOx3.  Normal affect.  Labs    Chemistry Recent Labs  Lab 04/14/17 0054 04/14/17 0617  NA 138 136  K 3.3* 3.7  CL 104 108  CO2 24 23  GLUCOSE 146* 115*  BUN 16 12  CREATININE 0.60 0.60  CALCIUM 9.2 8.5*  PROT 6.7  --   ALBUMIN 3.8  --   AST 29  --   ALT 23  --   ALKPHOS 62  --   BILITOT 0.5  --   GFRNONAA >60 >60  GFRAA >60 >60  ANIONGAP 10 5     Hematology Recent Labs  Lab 04/14/17 0054 04/14/17 0617  WBC 6.8 6.0  RBC 3.86 3.75*  HGB 12.1 11.7*  HCT 35.6 34.6*  MCV 92.1 92.4  MCH 31.4 31.2  MCHC 34.1 33.8  RDW 13.0 13.0  PLT 266 235    Cardiac Enzymes Recent Labs  Lab 04/14/17 0054 04/14/17 0617 04/14/17 1220 04/14/17 1801  TROPONINI 0.04* 7.77* 6.78* 7.18*     Radiology    No results found.  Telemetry    rsr - Personally Reviewed  Cardiac Studies   Cardiac Catheterization and Percutaneous Coronary Intervention2.27.2019      Left Main  Vessel is large.  Dist LM lesion 20% stenosed  Dist LM lesion is 20% stenosed.  Left Anterior Descending  Vessel is moderate in size.  Ost LAD lesion 30% stenosed  Ost LAD lesion is 30% stenosed.  Prox LAD lesion 60% stenosed  Prox LAD lesion is 60% stenosed.  First Diagonal Branch  Vessel is small in size.  Ost 1st Diag to 1st Diag lesion 50% stenosed  Ost 1st Diag to 1st Diag lesion is 50% stenosed.  First Septal Branch    Vessel is moderate in size.    Second Diagonal Branch    Vessel is moderate in size.    Left Circumflex   Vessel is large.   Ost Cx lesion 30% stenosed   Ost Cx lesion is 30% stenosed.   First Obtuse Marginal Branch  Vessel is moderate in size. There is mild  disease in the vessel.  Second Obtuse Marginal Branch  Vessel is large in size.  Third Obtuse Marginal Branch  Vessel is moderate in size. There is mild disease in the vessel.  Right Coronary Artery  Prox RCA to Mid RCA lesion 100% stenosed  Prox RCA to Mid RCA lesion is 100% stenosed. The lesion is thrombotic. **TREATED WITH A 3.5 X 38 MM RESOLUTE ONYX DES**  Mid RCA lesion 50% stenosed  Mid RCA lesion is 50% stenosed.  Right Posterior Descending Artery  Collaterals  RPDA filled by collaterals from 2nd Sept.   Left Ventricle The left ventricular size is normal. The left ventricular systolic function is normal. LV end diastolic pressure is normal. The left ventricular ejection fraction is 55-65% by visual estimate. There are LV function abnormalities due to segmental dysfunction. There is trivial (1+) mitral regurgitation. _____________  2D Echocardiogram 2.27.2019  Study Conclusions  - Left ventricle: The cavity size was normal. Systolic function was normal. The estimated ejection fraction was in the range of 50% to 55%. Hypokinesis of the basal to mid inferior wall. Features are consistent with a pseudonormal left ventricular filling pattern, with concomitant abnormal relaxation and increased filling pressure (grade 2 diastolic dysfunction). - Aortic valve: Transvalvular velocity was increased. There was mild stenosis. Peak velocity (S): 235 cm/s. Mean gradient (S): 14 mm Hg. Peak gradient (S): 22 mm Hg. - Mitral valve: There was mild regurgitation. - Left atrium: The atrium was mildly dilated. - Right ventricle: The cavity size was mildly dilated. Wall thickness was normal. Systolic function was normal. - Pulmonary arteries: Systolic pressure was within the normal range. _____________    Patient Profile     73 y.o.femalewith a hx of hypertension, hyperlipidemia (intolerant of statins), mild aortic stenosis, TIA, DVT/PE following remote knee  surgery, and ovarian cancer,who was admitted early 2/27 with inferior STEMI requiring cath/RCA DES.  Assessment & Plan    1.  Acute inferior ST segment elevation myocardial infarction/CAD: Status post PCI and drug-eluting stent placement to the right coronary artery in the setting of a total proximal and mid occlusion.  Moderate residual disease.  No chest pain or dyspnea overnight.  Continue aspirin, titrate beta-blocker, continue ACE inhibitor, and Zetia.  She is intolerant to multiple statins.  As previously discussed, as she will need  DVT prophylactic dosing of Eliquis (discussed with Ortho), I will load with Plavix 300 mg this morning.  Plan outpatient follow-up in cardiac rehab.  2.  Essential hypertension: room for improvement.  I will titrate beta-blocker 25 mg twice daily today.  Continue ACE inhibitor.  Next  3.  Hyperlipidemia: LDL 127.  Intolerant of statins.  Zetia started here.  We will likely need p as outpatient PCSK9i.  4.  Mild aortic stenosis: Loud murmur but echo this admission showed only mild left ear.  5.  Status post right total knee arthroplasty: Performed December 19 by Dr. Charlann Boxerlin.  Discharge from coming from the 21st.  [facility] that [facility] would like her to be on Eliquis 2.5 mg twice daily for the time being given prior history of DVT and PE.  In that setting, we are transitioning from Brilinta to Plavix.  She has follow-up with orthopedics next Wednesday.  She has been having significant right knee pain and is only willing to take Tylenol.  I have discussed this with her nurse.  6.  Disposition: Okay for discharge from cardiac standpoint.  We will arrange for follow-up within the next 7-10 days.  Signed, Nicolasa Duckinghristopher Berge, NP  04/16/2017, 8:26 AM    For questions or updates, please contact   Please consult [email or web address] for contact info under Cardiology/STEMI.

## 2017-04-16 NOTE — Discharge Instructions (Signed)
Cardiac  diet

## 2017-04-16 NOTE — Telephone Encounter (Signed)
Per Martie LeeSabrina- TCM appt 04/26/17 with Ward Givenshris Berge, NP  The patient was discharged today.

## 2017-04-16 NOTE — Progress Notes (Signed)
Pt is a 72 yo F with past medical history of DVT and PE status R TKA on 04/06/2017 presents to the emergency department complaining of burning in chest.  Patient with STEMI and was taken immediately to the Cath Lab.    Bonnita LevanJudy M [facility]  CARDIAC CATHETERIZATION  Order# [id number]  Reading physician: Yvonne KendallEnd, Christopher, MD Ordering physician: Yvonne KendallEnd, Christopher, MD Study date: 04/14/17  Physicians   Panel Physicians Referring Physician Case Authorizing Physician  End, Cristal Deerhristopher, MD (Primary)    Procedures   Coronary/Graft Acute MI Revascularization  LEFT HEART CATH AND CORONARY ANGIOGRAPHY  Conclusion   Conclusions: 1. Inferior STEMI with thrombotic occlusion of the proximal/mid RCA. 2. Mild to moderate, non-critical disease involving the LMCA, LAD, and LCx. 3. Basal and mid inferior akinesis with otherwise hyperdynamic LV contraction. LVEF 55-65%. 4. Normal left ventricular filling pressure. 5. Mild aortic stenosis (peak-to-peak gradient 15 mmHg). 6. Successful primary PCI to the proximal and mid RCA using a Resolute Onyx 3.5 x 38 mm drug-eluting stent with 0% residual stenosis and TIMI-3 flow.  Recommendations: 1. Dual antiplatelet therapy for for at least 12 months, ideally with aspirin and ticagrelor. If apixaban needs to be restarted for DVT prophylaxis following knee surgery earlier this month, ticagrelor should be transitioned to clopidogrel and triple therapy (aspirin, clopidogrel, and apixaban) continued until it is felt safe to discontinue apixaban from a postoperative standpoint. 2. Aggressive secondary prevention. Consider retrial of statins versus PCSK9 inhibitor therapy. 3. Obtain transthoracic echocardiogram. 4. Medical therapy for non-critical disease involving the LAD. If the patient develops angina, functional study or FFR could be considered to assess hemodynamic significance of the lesion.  Yvonne Kendallhristopher End, MD [facility] Pager: [phone]   Past Medical Hx of DVT  and PE s/p knee replacement a few years ago, HTN, ovarian cancer, TIA, non-rheumatic aortic stenosis, OA, bilateral knee replacements and lumbar fusion.  Patient is a NEVER smoker.    Patient lives alone now, as her husband passed away last October (2018).  She has 3 children; one who is Bipolar and unreliable.  Patient's close friend, Hardie LoraLavern is currently out-of-town for an extended period of time.    "Heart Attack Bouncing Back" booklet given and reviewed with patient and family. Discussed the definition of CAD. Reviewed the location CAD and where stents were placed. Patient has stent card. Explained the purpose of the stent card. Instructed patient to keep stent card in her wallet.  ? Discussed modifiable risk factors including controlling blood pressure, cholesterol, and blood sugar; following heart healthy diet; maintaining healthy weight; exercise; and smoking cessation, if applicable. ?Note: Patient is a NEVER smoker.  ? Discussed cardiac medications including rationale for taking, mechanisms of action, and side effects. Stressed the importance of taking medications as prescribed.  ? Discussed emergency plan for heart attack symptoms. Patient verbalized understanding of need to call 911 and not to drive herself to ER if having cardiac symptoms / chest pain.  ? Heart healthy diet of low sodium, low fat, low cholesterol heart healthy diet discussed. Information on diet provided.  ? Smoking Cessation - Patient is a NEVER smoker.   Exercise - Benefits of exercised discussed. Informed patient cardiologist has referred him to outpatient Cardiac Rehab. An overview of the program was provided. Patient is interested in participating, but must complete in-home PT for recent knee replacement and get clearance from Ortho MD prior to starting Cardiac Rehab.  Cardiac Rehab brochure as well as my business card was provided to patient.  Discussed in-needs with patient.  Patient is requesting a real person  physical therapist come to her home and provide instruction, as prior to MI she was using an online PT program.  Patient informed this RN that her Internet connection was not good where she lives.   Patient also interested in an aide as well since she lives alone.  Will discuss patient's requests / needs with RNCM.    [facility] [place] - Support Group for Women Living with Heart Disease. Information provided. Patient invited and encouraged to attend. Brochure given.   Patient appreciative of the information.   Army Melia, RN, BSN, [facility]  [place]  [facility] Cardiac & Pulmonary Rehab  Cardiovascular & Pulmonary Nurse Navigator  Direct Line: [phone]  Department Phone #: [phone] Fax: [phone]  Email Address: [email or web address]

## 2017-04-16 NOTE — Care Management Important Message (Signed)
Important Message  Patient Details  Name: Cammy CopaJudy M President MRN: [id number] Date of Birth: 1946-02-05   Medicare Important Message Given:  Yes    Eber HongGreene, Amarya Kuehl R, RN 04/16/2017, 1:40 PM

## 2017-04-16 NOTE — Care Management (Addendum)
Left VM for patient's caseworker Hailey SicilianJill Lauer RN [phone]. Patient is currently doing virtual PT in her home and she says this is not adequate because of weak internet signal. Patient's knee replacement is being covered under a medicare bundle with Hailey Shields.  Attempted to set up home health nurse, therapist and aide.  Per Hailey LarssonJill, Hailey Shields "will not give order for home health services."  CM discussed patient had stemi with PCI x 2 . Hailey LarssonJill continued to say that Hailey Shields will not sign home health orders for nurse or aide.  Said that patient's internet issues will be resolved.  Informed patient and attending.  Patient declined ability to pay out of pocket for home health nurse

## 2017-04-16 NOTE — Discharge Summary (Signed)
[facility] [place] [facility]   PATIENT NAME: Hailey Shields    MR#:  [id number]  DATE OF BIRTH:  04/27/1945  DATE OF ADMISSION:  04/14/2017 ADMITTING PHYSICIAN: Yvonne Kendall, MD  DATE OF DISCHARGE: 04/16/2017  1:39 PM  PRIMARY CARE PHYSICIAN: Olen Cordial, MD   ADMISSION DIAGNOSIS:  ST elevation myocardial infarction (STEMI) involving other coronary artery of inferior wall (HCC) [I21.19] STEMI involving right coronary artery (HCC) [I21.11]  DISCHARGE DIAGNOSIS:  Principal Problem:   STEMI (ST elevation myocardial infarction) (HCC) Active Problems:   STEMI involving right coronary artery (HCC)   SECONDARY DIAGNOSIS:   Past Medical History:  Diagnosis Date  . CAD (coronary artery disease)    a. 03/2017 Inf STEMI/PCI: LM 20d, LAD 30ost, 60p, D1 50ost, LCX 30ost, RCA 100p (3.5x38 Resolute Onyx DES), 32m, RPDA fills vial L->R collats from 2nd septal, EF 55-65%, 1+MR.  Marland Kitchen Carpal tunnel syndrome of right wrist    effects thumb and first two fingers  . Diastolic dysfunction    a. 03/2017 Echo: Gr2 DD. EF 50-55%.  Marland Kitchen History of chemotherapy 2004  COMPLETED  . 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 History of exercise stress test 03-24-2016   dr croitoru   per note Intermediate risk secondary to poor exercise effort, no ecg ischemia; needs nuclear stress test  . History of kidney stones   . History of ovarian cancer 2003--- per pt no recurrence   s/p  TAH w/ BSO and completed chemotherapy in 2004 , no radiation  . History of pulmonary embolus (PE) 08/2015   a. 08/2015 s/p L TKA-->DVT and PE.  Marland Kitchen History of transient ischemic attack (TIA) 2004   "just after chemotherapy"  . Hypertension   . Nonrheumatic aortic (valve) stenosis    per last echo 04-06-2016  mild stenosis w/ valve area 1.69cm^2  . OA (osteoarthritis)    a. s/p bilat TKA (L 2017, R 2019).  . Valvular heart disease    a. 03/2017 Echo: EF 50-55%, basal to mid  inferior HK, Gr2 DD, mild AS/MR, mildly dil LA/RV.  Marland Kitchen Wears glasses      ADMITTING HISTORY  Chief Complaint: Chest pain HPI: The patient with past medical history of DVT and PE status post knee replacement a few years ago and hypertension presents to the emergency department complaining of chest pain.  The pain began at rest and was pressure-like in quality.  The pain did not radiate.  The patient felt some shortness of breath during her chest pain as well.  EKG in the emergency department showed ST elevations in inferior leads which initiated the STEMI protocol which resulted in PCI to the RCA.  The patient tolerated her procedure well and was admitted to the ICU with their management per the hospitalist team.    HOSPITAL COURSE:    * STEMI with PCI to RCA with DES On ASA, Plavix, Zetia. Discussed with Dr. Mariah Milling Cardiac rehab referral at discharge Patient monitor for 2 days in the hospital post stent.  No complications found.  Chest pain-free.  No shortness of breath.  * HTN Continue home meds  * Recent knee surgery PT consulted Home health set up. Eliquis 2.5 mg twice daily as per her orthopedic request.  Patient discharged home in stable condition.    CONSULTS OBTAINED:    DRUG ALLERGIES:   Allergies  Allergen Reactions  . Codeine Other (See Comments)  Hallucinations  . Hydrocodone Other (See Comments)    hallucinations  . Oxycodone Other (See Comments)    hallucinations  . Prednisone Other (See Comments)    Increase heart rate  . Statins Other (See Comments)    Myalgia, weakness   . Tramadol Nausea And Vomiting  . Zithromax [Azithromycin] Other (See Comments)    Increase heart rate    DISCHARGE MEDICATIONS:   Allergies as of 04/16/2017      Reactions   Codeine Other (See Comments)   Hallucinations   Hydrocodone Other (See Comments)   hallucinations   Oxycodone Other (See Comments)   hallucinations   Prednisone Other (See Comments)   Increase  heart rate   Statins Other (See Comments)   Myalgia, weakness    Tramadol Nausea And Vomiting   Zithromax [azithromycin] Other (See Comments)   Increase heart rate      Medication List    STOP taking these medications   lisinopril-hydrochlorothiazide 20-12.5 MG tablet Commonly known as:  PRINZIDE,ZESTORETIC     TAKE these medications   acetaminophen 500 MG tablet Commonly known as:  TYLENOL Take 2 tablets (1,000 mg total) by mouth every 8 (eight) hours.   ALLER-CHLOR 4 MG tablet Generic drug:  chlorpheniramine Take 4 mg by mouth daily.   apixaban 2.5 MG Tabs tablet Commonly known as:  ELIQUIS Take 1 tablet (2.5 mg total) by mouth 2 (two) times daily.   aspirin 81 MG chewable tablet Commonly known as:  ASPIRIN CHILDRENS Chew 1 tablet (81 mg total) by mouth 2 (two) times daily. Take for 4 weeks, then resume regular dose.   clopidogrel 75 MG tablet Commonly known as:  PLAVIX Take 1 tablet (75 mg total) by mouth daily.   docusate sodium 100 MG capsule Commonly known as:  COLACE Take 1 capsule (100 mg total) by mouth 2 (two) times daily.   ferrous sulfate 325 (65 FE) MG tablet Commonly known as:  FERROUSUL Take 1 tablet (325 mg total) by mouth 3 (three) times daily with meals.   lisinopril 20 MG tablet Commonly known as:  PRINIVIL,ZESTRIL Take 1 tablet (20 mg total) by mouth daily.   methocarbamol 500 MG tablet Commonly known as:  ROBAXIN Take 1 tablet (500 mg total) by mouth every 6 (six) hours as needed for muscle spasms.   metoprolol tartrate 25 MG tablet Commonly known as:  LOPRESSOR Take 1 tablet (25 mg total) by mouth 2 (two) times daily.   multivitamin tablet Take 1 tablet by mouth daily.   ondansetron 4 MG tablet Commonly known as:  ZOFRAN Take 1 tablet (4 mg total) by mouth every 8 (eight) hours as needed for nausea.   polyethylene glycol packet Commonly known as:  MIRALAX / GLYCOLAX Take 17 g by mouth 2 (two) times daily.   sodium chloride 0.65  % Soln nasal spray Commonly known as:  OCEAN Place 1 spray into both nostrils daily.   traMADol 50 MG tablet Commonly known as:  ULTRAM Take 1 tablet (50 mg total) by mouth every 6 (six) hours as needed.       Today   VITAL SIGNS:  Blood pressure 137/73, pulse 72, temperature (!) 97.5 F (36.4 C), temperature source Oral, resp. rate 16, height 5\' 1"  (1.549 m), weight 91.5 kg (201 lb 11.2 oz), SpO2 99 %.  I/O:    Intake/Output Summary (Last 24 hours) at 04/16/2017 1546 Last data filed at 04/16/2017 0900 Gross per 24 hour  Intake 240 ml  Output 1550  ml  Net -1310 ml    PHYSICAL EXAMINATION:  Physical Exam  GENERAL:  72 y.o.-year-old patient lying in the bed with no acute distress.  LUNGS: Normal breath sounds bilaterally, no wheezing, rales,rhonchi or crepitation. No use of accessory muscles of respiration.  CARDIOVASCULAR: S1, S2 normal. No murmurs, rubs, or gallops.  ABDOMEN: Soft, non-tender, non-distended. Bowel sounds present. No organomegaly or mass.  NEUROLOGIC: Moves all 4 extremities. PSYCHIATRIC: The patient is alert and oriented x 3.  SKIN: No obvious rash, lesion, or ulcer.   DATA REVIEW:   CBC Recent Labs  Lab 04/16/17 0830  WBC 5.5  HGB 12.5  HCT 37.2  PLT 233    Chemistries  Recent Labs  Lab 04/14/17 0054 04/14/17 0617 04/16/17 0830  NA 138 136 136  K 3.3* 3.7 4.1  CL 104 108 104  CO2 24 23 23   GLUCOSE 146* 115* 110*  BUN 16 12 10   CREATININE 0.60 0.60 0.50  CALCIUM 9.2 8.5* 9.4  MG  --  1.8  --   AST 29  --   --   ALT 23  --   --   ALKPHOS 62  --   --   BILITOT 0.5  --   --     Cardiac Enzymes Recent Labs  Lab 04/14/17 1801  TROPONINI 7.18*    Microbiology Results  Results for orders placed or performed during the hospital encounter of 04/14/17  MRSA PCR Screening     Status: None   Collection Time: 04/14/17  2:27 AM  Result Value Ref Range Status   MRSA by PCR NEGATIVE NEGATIVE Final    Comment:        The GeneXpert MRSA  Assay (FDA approved for NASAL specimens only), is one component of a comprehensive MRSA colonization surveillance program. It is not intended to diagnose MRSA infection nor to guide or monitor treatment for MRSA infections. Performed at [facility], [address], [place], [place] [address]     RADIOLOGY:  No results found.  Follow up with PCP in 1 week.  Management plans discussed with the patient, family and they are in agreement.  CODE STATUS:     Code Status Orders  (From admission, onward)        Start     Ordered   04/14/17 0241  Full code  Continuous     04/14/17 0240    Code Status History    Date Active Date Inactive Code Status Order ID Comments User Context   04/14/2017 02:22 04/14/2017 02:40 Full Code [id number]  Yvonne Kendall, MD Inpatient   04/06/2017 20:12 04/08/2017 19:38 Full Code [id number]  Shelly Coss Inpatient   08/27/2015 17:25 08/29/2015 18:04 Full Code [id number]  Shelly Coss Inpatient   11/27/2014 15:50 12/01/2014 15:11 Full Code [id number]  Darnell Level, MD Inpatient   11/26/2014 14:42 11/27/2014 15:50 Full Code [id number]  Barnetta Chapel, PA-C Inpatient    Advance Directive Documentation     Most Recent Value  Type of Advance Directive  Healthcare Power of Attorney, Living will  Pre-existing out of facility DNR order (yellow form or pink MOST form)  No data  "MOST" Form in Place?  No data      TOTAL TIME TAKING CARE OF THIS PATIENT ON DAY OF DISCHARGE: more than 30 minutes.   Molinda Bailiff Naftoli Penny M.D on 04/16/2017 at 3:46 PM  Between 7am to 6pm - Pager - [phone]  After 6pm go to [email or web address] - password [email or web address]  [facility]  [facility]  Office  [phone]  CC: Primary care physician; Olen Cordial, MD  Note: This dictation was prepared with Dragon dictation along with smaller phrase technology. Any transcriptional errors that result from this process are unintentional.

## 2017-04-19 NOTE — Telephone Encounter (Signed)
No answer. Left message to call back.   

## 2017-04-20 NOTE — Telephone Encounter (Signed)
Left voicemail message to call back  

## 2017-04-21 NOTE — Telephone Encounter (Signed)
No answer. Left message to call back.  3rd attempt for TCM. Closing encounter.

## 2017-04-26 ENCOUNTER — Encounter: Payer: Self-pay | Admitting: Nurse Practitioner

## 2017-04-26 ENCOUNTER — Ambulatory Visit (INDEPENDENT_AMBULATORY_CARE_PROVIDER_SITE_OTHER): Payer: Medicare Other | Admitting: Nurse Practitioner

## 2017-04-26 VITALS — BP 140/64 | HR 67 | Ht 61.0 in | Wt 197.0 lb

## 2017-04-26 DIAGNOSIS — E785 Hyperlipidemia, unspecified: Secondary | ICD-10-CM | POA: Diagnosis not present

## 2017-04-26 DIAGNOSIS — Z96651 Presence of right artificial knee joint: Secondary | ICD-10-CM | POA: Diagnosis not present

## 2017-04-26 DIAGNOSIS — I2510 Atherosclerotic heart disease of native coronary artery without angina pectoris: Secondary | ICD-10-CM

## 2017-04-26 DIAGNOSIS — I2119 ST elevation (STEMI) myocardial infarction involving other coronary artery of inferior wall: Secondary | ICD-10-CM

## 2017-04-26 DIAGNOSIS — I10 Essential (primary) hypertension: Secondary | ICD-10-CM | POA: Diagnosis not present

## 2017-04-26 MED ORDER — METOPROLOL TARTRATE 25 MG PO TABS: 25.0000 mg | ORAL_TABLET | Freq: Two times a day (BID) | ORAL | 2 refills | Status: DC

## 2017-04-26 MED ORDER — CLOPIDOGREL BISULFATE 75 MG PO TABS: 75.0000 mg | ORAL_TABLET | Freq: Every day | ORAL | 2 refills | Status: DC

## 2017-04-26 MED ORDER — EZETIMIBE 10 MG PO TABS: 10.0000 mg | ORAL_TABLET | Freq: Every day | ORAL | 2 refills | Status: DC

## 2017-04-26 MED ORDER — NITROGLYCERIN 0.4 MG SL SUBL: 0.4000 mg | SUBLINGUAL_TABLET | SUBLINGUAL | 3 refills | Status: AC | PRN

## 2017-04-26 MED ORDER — LISINOPRIL 20 MG PO TABS: 20.0000 mg | ORAL_TABLET | Freq: Every day | ORAL | 2 refills | Status: DC

## 2017-04-26 NOTE — Progress Notes (Signed)
Thank you Hailey OhmChris, no objection to switching to Dr. Okey DupreEnd

## 2017-04-26 NOTE — Progress Notes (Signed)
Office Visit    Patient Name: Hailey Shields Date of Encounter: 04/26/2017  Primary Care Provider:  Olen Cordial, MD Primary Cardiologist:  Thurmon Fair, MD - pt wished to tx care to [facility] off for sake of convenience.  Chief Complaint    72 year old female with a history of CAD status post recent inferior ST elevation MI and drug-eluting stent placement to the right coronary artery, hypertension, hyperlipidemia, mild aortic stenosis, history of DVT/PE, and osteoarthritis status post right total knee arthroplasty, who presents for f/u.  Past Medical History    Past Medical History:  Diagnosis Date  . CAD (coronary artery disease)    a. 03/2017 Inf STEMI/PCI: LM 20d, LAD 30ost, 60p, D1 50ost, LCX 30ost, RCA 100p (3.5x38 Resolute Onyx DES), 62m, RPDA fills vial L->R collats from 2nd septal, EF 55-65%, 1+MR.  Marland Kitchen Carpal tunnel syndrome of right wrist    effects thumb and first two fingers  . Diastolic dysfunction    a. 03/2017 Echo: Gr2 DD. EF 50-55%.  Marland Kitchen History of chemotherapy 2004  COMPLETED  . 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 History of exercise stress test 03-24-2016   dr croitoru   per note Intermediate risk secondary to poor exercise effort, no ecg ischemia; needs nuclear stress test  . History of kidney stones   . History of ovarian cancer 2003--- per pt no recurrence   s/p  TAH w/ BSO and completed chemotherapy in 2004 , no radiation  . History of pulmonary embolus (PE) 08/2015   a. 08/2015 s/p L TKA-->DVT and PE.  Marland Kitchen History of transient ischemic attack (TIA) 2004   "just after chemotherapy"  . Hypertension   . Nonrheumatic aortic (valve) stenosis    per last echo 04-06-2016  mild stenosis w/ valve area 1.69cm^2  . OA (osteoarthritis)    a. s/p bilat TKA (L 2017, R 2019).  . Valvular heart disease    a. 03/2017 Echo: EF 50-55%, basal to mid inferior HK, Gr2 DD, mild AS/MR, mildly dil LA/RV.  Marland Kitchen Wears glasses    Past  Surgical History:  Procedure Laterality Date  . CORONARY/GRAFT ACUTE MI REVASCULARIZATION N/A 04/14/2017   Procedure: Coronary/Graft Acute MI Revascularization;  Surgeon: Yvonne Kendall, MD;  Location: [facility] INVASIVE CV LAB;  Service: Cardiovascular;  Laterality: N/A;  . INSERTION OF MESH N/A 11/27/2014   Procedure: INSERTION OF MESH;  Surgeon: Darnell Level, MD;  Location: [facility];  Service: General;  Laterality: N/A;  . LAPAROTOMY N/A 11/27/2014   Procedure: EXPLORATORY LAPAROTOMY;  Surgeon: Darnell Level, MD;  Location: [facility];  Service: General;  Laterality: N/A;  . LEFT HEART CATH AND CORONARY ANGIOGRAPHY N/A 04/14/2017   Procedure: LEFT HEART CATH AND CORONARY ANGIOGRAPHY;  Surgeon: Yvonne Kendall, MD;  Location: [facility] INVASIVE CV LAB;  Service: Cardiovascular;  Laterality: N/A;  . LUMBAR FUSION  2009  . TONSILLECTOMY  age 22  . TOTAL ABDOMINAL HYSTERECTOMY W/ BILATERAL SALPINGOOPHORECTOMY  2003   AND CHOLECYSTECTOMY/  MULTIPLE LYMPH NODE DISSECTIONS  . TOTAL KNEE ARTHROPLASTY Left 08/27/2015   Procedure: LEFT TOTAL KNEE ARTHROPLASTY;  Surgeon: Durene Romans, MD;  Location: [facility];  Service: Orthopedics;  Laterality: Left;  . TOTAL KNEE ARTHROPLASTY Right 04/06/2017   Procedure: RIGHT TOTAL KNEE ARTHROPLASTY;  Surgeon: Durene Romans, MD;  Location: [facility];  Service: Orthopedics;  Laterality: Right;  70 mins  . TRANSTHORACIC ECHOCARDIOGRAM  04-06-2016  dr croitoru   ef 55-60%,  grade 1 diastolic dysfunction/  mild AV stenosis (valve area 1.69cm^2)/ trivial MR/  mild LAE/  mild TR  . UMBILICAL HERNIA REPAIR  2001  APPROX.  Marland Kitchen VENTRAL HERNIA REPAIR N/A 11/27/2014   Procedure: HERNIA REPAIR VENTRAL ADULT;  Surgeon: Darnell Level, MD;  Location: [facility];  Service: General;  Laterality: N/A;  . VENTRAL HERNIA REPAIR  2008 APPROX.    Allergies  Allergies  Allergen Reactions  . Codeine Other (See Comments)    Hallucinations  . Hydrocodone Other (See Comments)    hallucinations  . Oxycodone Other  (See Comments)    hallucinations  . Prednisone Other (See Comments)    Increase heart rate  . Statins Other (See Comments)    Myalgia, weakness   . Tramadol Nausea And Vomiting  . Zithromax [Azithromycin] Other (See Comments)    Increase heart rate    History of Present Illness    72 year old female with the above complex past medical history including hypertension, hyperlipidemia, mild aortic stenosis, history of DVT and PE following left total knee arthroplasty, with a recent right total knee arthroplasty.  Following most recent surgery on February 19, she was placed on Eliquis postoperatively for aggressive DVT prophylaxis.  She felt as though she was recovering well but in the late evening hours of February 26, she developed acute substernal chest burning radiating to her neck, and accompanied with shortness of breath and nausea.  She thought perhaps she was experiencing another PE.  She called 911 was found to have inferior ST segment elevation.  She was taken emergently to [facility] where she underwent emergent diagnostic catheterization revealing a thrombotic occlusion of the proximal/mid right coronary artery and otherwise nonobstructive disease.  The RCA was successfully treated with a drug-eluting stent.  Post procedure, she had no recurrent chest pain.  Echocardiogram showed normal LV function with grade 2 diastolic dysfunction.  We reached out to Ortho who recommended continuation of DVT prophylactic therapy with Eliquis 2.5 mg twice daily.  In that setting, we switched her from Brilinta to Plavix and discontinued aspirin, at least until she is off of Eliquis.  Since her discharge, she has done well from a cardiac standpoint.  She denies any recurrent chest burning, pain, or dyspnea.  She has had right knee pain and has been seen by Ortho in follow-up.  She has been compliant with medications and tolerating them well.  Of note, she was placed on Zetia prior to discharge in the  setting of prior statin intolerance.  So far, she is tolerating it.  She is looking forward to physical therapy for her right knee as she wants to get back on her feet and become more independent.  She denies PND, orthopnea, palpitations, dizziness, syncope, edema, or early satiety.  Home Medications    Prior to Admission medications   Medication Sig Start Date End Date Taking? Authorizing Provider  acetaminophen (TYLENOL) 500 MG tablet Take 2 tablets (1,000 mg total) by mouth every 8 (eight) hours. 04/06/17  Yes Babish, Molli Hazard, PA-C  apixaban (ELIQUIS) 2.5 MG TABS tablet Take 1 tablet (2.5 mg total) by mouth 2 (two) times daily. 04/16/17  Yes Sudini, Wardell Heath, MD  clopidogrel (PLAVIX) 75 MG tablet Take 1 tablet (75 mg total) by mouth daily. 04/16/17 04/16/18 Yes Sudini, Wardell Heath, MD  lisinopril (PRINIVIL,ZESTRIL) 20 MG tablet Take 1 tablet (20 mg total) by mouth daily. 04/16/17  Yes Sudini, Wardell Heath, MD  metoprolol tartrate (LOPRESSOR) 25 MG tablet Take 1 tablet (25 mg total)  by mouth 2 (two) times daily. 04/16/17  Yes Sudini, Wardell HeathSrikar, MD  Multiple Vitamin (MULTIVITAMIN) tablet Take 1 tablet by mouth daily.   Yes [provider]    Review of Systems    Her only complaint today is that of right knee pain.  She denies chest pain, palpitations, dyspnea, PND, orthopnea, dizziness, syncope, edema, or early satiety.  All other systems reviewed and are otherwise negative except as noted above.  Physical Exam    VS:  BP 140/64 (BP Location: Left Arm, Patient Position: Sitting, Cuff Size: Large)   Pulse 67   Ht 5\' 1"  (1.549 m)   Wt 197 lb (89.4 kg)   BMI 37.22 kg/m  , BMI Body mass index is 37.22 kg/m. GEN: Well nourished, well developed, in no acute distress.  HEENT: normal.  Neck: Supple, no JVD, carotid bruits, or masses. Cardiac: RRR, 3/6 systolic ejection murmur loudest at the upper sternal borders, no rubs, or gallops. No clubbing, cyanosis, edema.  Radials/DP/PT 2+ and equal bilaterally.  Right  radial cath site without bleeding, bruit, or hematoma. Respiratory:  Respirations regular and unlabored, clear to auscultation bilaterally. GI: Soft, nontender, nondistended, BS + x 4. MS: no deformity or atrophy. Skin: warm and dry, no rash. Neuro:  Strength and sensation are intact. Psych: Normal affect.  Accessory Clinical Findings    ECG -regular sinus rhythm, 67, left axis deviation, LVH, prior inferior infarct, anterolateral biphasic T waves.  Assessment & Plan    1.  Inferior ST segment elevation myocardial infarction, subsequent episode of care/coronary artery disease: Status post recent admission for chest pain and inferior ST elevation MI requiring drug-eluting stent placement to the right coronary artery.  She otherwise had nonobstructive disease.  Hospital course was relatively uncomplicated and she has been doing well since d/c.  She remains on plavix,  blocker, acei, and zetia.  No ASA in setting of ongoing eliquis. No statin in setting of prior intolerance.  She is interested in cardiac rehab but must first rehab her knee following recent R TKA.  2.  Essential HTN:  Stable on  blocker and acei.  3.  HL:  Intolerant to statins in the past (myalgias).  Tolerating zetia, which was started last week.  LDL was 127 @ that time.  Plan to f/u lipids/lft's in ~6 wks (f/u visit) and likely pursue PCSK9i therapy if not @ goal.  4.  Mild Ao Stenosis:  Loud systolic murmur but stable, mild AS by echo during admission.  5.  S/p R TKA w/ h/o DVT/PE:  She will begin PT for her right knee.  She remains on low dose Eliquis per ortho.  She knows that once eliquis is d/c'd, she should begin ASA 81mg  daily.  6.  Dispo:  F/u in clinic in ~ 4 wks with Dr. Okey DupreEnd.  She lives in the [place] area and wishes to transfer her care to this office since she will be having to f/u more often (used to f/u annually).  Will need lipids/lft's @ next f/u.   Nicolasa Duckinghristopher Alwilda Gilland, NP 04/26/2017, 1:54 PM

## 2017-04-26 NOTE — Patient Instructions (Signed)
Medication Instructions:  Your physician recommends that you continue on your current medications as directed. Please refer to the Current Medication list given to you today.   Labwork: none  Testing/Procedures: none  Follow-Up: Your physician recommends that you schedule a follow-up appointment in: 6-8 WEEKS WITH DR END.    If you need a refill on your cardiac medications before your next appointment, please call your pharmacy.   

## 2017-06-24 ENCOUNTER — Encounter: Payer: Self-pay | Admitting: Internal Medicine

## 2017-06-24 ENCOUNTER — Ambulatory Visit (INDEPENDENT_AMBULATORY_CARE_PROVIDER_SITE_OTHER): Payer: Medicare Other | Admitting: Internal Medicine

## 2017-06-24 VITALS — BP 146/80 | HR 55 | Ht 63.0 in | Wt 194.8 lb

## 2017-06-24 DIAGNOSIS — I350 Nonrheumatic aortic (valve) stenosis: Secondary | ICD-10-CM

## 2017-06-24 DIAGNOSIS — I2510 Atherosclerotic heart disease of native coronary artery without angina pectoris: Secondary | ICD-10-CM | POA: Diagnosis not present

## 2017-06-24 DIAGNOSIS — I10 Essential (primary) hypertension: Secondary | ICD-10-CM

## 2017-06-24 DIAGNOSIS — E785 Hyperlipidemia, unspecified: Secondary | ICD-10-CM

## 2017-06-24 NOTE — Patient Instructions (Signed)
Labwork: Lipid and CMP labs. Please go to [facility] Entrance and check in at the front desk with no appointment needed. Make sure not to eat or drink after midnight prior with only small sip of water with pills. Take lab slips with you as well. We will call you with those results.   I sent message to Cardiac Rehab and will have them reach out to you.   Follow-Up: Your physician recommends that you schedule a follow-up appointment in: 3 months with Dr. Okey Dupre.  It was a pleasure seeing you today here in the office. Please do not hesitate to give [place] a call back if you have any further questions. [phone]  [place]-Dade Cellar RN, BSN       DASH Eating Plan DASH stands for "Dietary Approaches to Stop Hypertension." The DASH eating plan is a healthy eating plan that has been shown to reduce high blood pressure (hypertension). It may also reduce your risk for type 2 diabetes, heart disease, and stroke. The DASH eating plan may also help with weight loss. What are tips for following this plan? General guidelines  Avoid eating more than 2,300 mg (milligrams) of salt (sodium) a day. If you have hypertension, you may need to reduce your sodium intake to 1,500 mg a day.  Limit alcohol intake to no more than 1 drink a day for nonpregnant women and 2 drinks a day for men. One drink equals 12 oz of beer, 5 oz of wine, or 1 oz of hard liquor.  Work with your health care provider to maintain a healthy body weight or to lose weight. Ask what an ideal weight is for you.  Get at least 30 minutes of exercise that causes your heart to beat faster (aerobic exercise) most days of the week. Activities may include walking, swimming, or biking.  Work with your health care provider or diet and nutrition specialist (dietitian) to adjust your eating plan to your individual calorie needs. Reading food labels  Check food labels for the amount of sodium per serving. Choose foods with less than 5 percent of the  Daily Value of sodium. Generally, foods with less than 300 mg of sodium per serving fit into this eating plan.  To find whole grains, look for the word "whole" as the first word in the ingredient list. Shopping  Buy products labeled as "low-sodium" or "no salt added."  Buy fresh foods. Avoid canned foods and premade or frozen meals. Cooking  Avoid adding salt when cooking. Use salt-free seasonings or herbs instead of table salt or sea salt. Check with your health care provider or pharmacist before using salt substitutes.  Do not fry foods. Cook foods using healthy methods such as baking, boiling, grilling, and broiling instead.  Cook with heart-healthy oils, such as olive, canola, soybean, or sunflower oil. Meal planning   Eat a balanced diet that includes: ? 5 or more servings of fruits and vegetables each day. At each meal, try to fill half of your plate with fruits and vegetables. ? Up to 6-8 servings of whole grains each day. ? Less than 6 oz of lean meat, poultry, or fish each day. A 3-oz serving of meat is about the same size as a deck of cards. One egg equals 1 oz. ? 2 servings of low-fat dairy each day. ? A serving of nuts, seeds, or beans 5 times each week. ? Heart-healthy fats. Healthy fats called Omega-3 fatty acids are found in foods such as flaxseeds and  coldwater fish, like sardines, salmon, and mackerel.  Limit how much you eat of the following: ? Canned or prepackaged foods. ? Food that is high in trans fat, such as fried foods. ? Food that is high in saturated fat, such as fatty meat. ? Sweets, desserts, sugary drinks, and other foods with added sugar. ? Full-fat dairy products.  Do not salt foods before eating.  Try to eat at least 2 vegetarian meals each week.  Eat more home-cooked food and less restaurant, buffet, and fast food.  When eating at a restaurant, ask that your food be prepared with less salt or no salt, if possible. What foods are  recommended? The items listed may not be a complete list. Talk with your dietitian about what dietary choices are best for you. Grains Whole-grain or whole-wheat bread. Whole-grain or whole-wheat pasta. Brown rice. Orpah Cobb. Bulgur. Whole-grain and low-sodium cereals. Pita bread. Low-fat, low-sodium crackers. Whole-wheat flour tortillas. Vegetables Fresh or frozen vegetables (raw, steamed, roasted, or grilled). Low-sodium or reduced-sodium tomato and vegetable juice. Low-sodium or reduced-sodium tomato sauce and tomato paste. Low-sodium or reduced-sodium canned vegetables. Fruits All fresh, dried, or frozen fruit. Canned fruit in natural juice (without added sugar). Meat and other protein foods Skinless chicken or [place]. Ground chicken or [place]. Pork with fat trimmed off. Fish and seafood. Egg whites. Dried beans, peas, or lentils. Unsalted nuts, nut butters, and seeds. Unsalted canned beans. Lean cuts of beef with fat trimmed off. Low-sodium, lean deli meat. Dairy Low-fat (1%) or fat-free (skim) milk. Fat-free, low-fat, or reduced-fat cheeses. Nonfat, low-sodium ricotta or cottage cheese. Low-fat or nonfat yogurt. Low-fat, low-sodium cheese. Fats and oils Soft margarine without trans fats. Vegetable oil. Low-fat, reduced-fat, or light mayonnaise and salad dressings (reduced-sodium). Canola, safflower, olive, soybean, and sunflower oils. Avocado. Seasoning and other foods Herbs. Spices. Seasoning mixes without salt. Unsalted popcorn and pretzels. Fat-free sweets. What foods are not recommended? The items listed may not be a complete list. Talk with your dietitian about what dietary choices are best for you. Grains Baked goods made with fat, such as croissants, muffins, or some breads. Dry pasta or rice meal packs. Vegetables Creamed or fried vegetables. Vegetables in a cheese sauce. Regular canned vegetables (not low-sodium or reduced-sodium). Regular canned tomato sauce and paste (not  low-sodium or reduced-sodium). Regular tomato and vegetable juice (not low-sodium or reduced-sodium). Rosita Fire. Olives. Fruits Canned fruit in a light or heavy syrup. Fried fruit. Fruit in cream or butter sauce. Meat and other protein foods Fatty cuts of meat. Ribs. Fried meat. Tomasa Blase. Sausage. Bologna and other processed lunch meats. Salami. Fatback. Hotdogs. Bratwurst. Salted nuts and seeds. Canned beans with added salt. Canned or smoked fish. Whole eggs or egg yolks. Chicken or [place] with skin. Dairy Whole or 2% milk, cream, and half-and-half. Whole or full-fat cream cheese. Whole-fat or sweetened yogurt. Full-fat cheese. Nondairy creamers. Whipped toppings. Processed cheese and cheese spreads. Fats and oils Butter. Stick margarine. Lard. Shortening. Ghee. Bacon fat. Tropical oils, such as coconut, palm kernel, or palm oil. Seasoning and other foods Salted popcorn and pretzels. Onion salt, garlic salt, seasoned salt, table salt, and sea salt. Worcestershire sauce. Tartar sauce. Barbecue sauce. Teriyaki sauce. Soy sauce, including reduced-sodium. Steak sauce. Canned and packaged gravies. Fish sauce. Oyster sauce. Cocktail sauce. Horseradish that you find on the shelf. Ketchup. Mustard. Meat flavorings and tenderizers. Bouillon cubes. Hot sauce and Tabasco sauce. Premade or packaged marinades. Premade or packaged taco seasonings. Relishes. Regular salad dressings. Where to find  more information:  National Heart, Lung, and Blood Institute: PopSteam.is  American Heart Association: www.heart.org Summary  The DASH eating plan is a healthy eating plan that has been shown to reduce high blood pressure (hypertension). It may also reduce your risk for type 2 diabetes, heart disease, and stroke.  With the DASH eating plan, you should limit salt (sodium) intake to 2,300 mg a day. If you have hypertension, you may need to reduce your sodium intake to 1,500 mg a day.  When on the DASH eating plan,  aim to eat more fresh fruits and vegetables, whole grains, lean proteins, low-fat dairy, and heart-healthy fats.  Work with your health care provider or diet and nutrition specialist (dietitian) to adjust your eating plan to your individual calorie needs. This information is not intended to replace advice given to you by your health care provider. Make sure you discuss any questions you have with your health care provider. Document Released: 01/22/2011 Document Revised: 01/27/2016 Document Reviewed: 01/27/2016 Elsevier Interactive Patient Education  [employer].

## 2017-06-24 NOTE — Progress Notes (Signed)
Follow-up Outpatient Visit Date: 06/24/2017  Primary Care Provider: Olen Cordial, MD [address] [place] [address]  Chief Complaint: Follow-up coronary artery disease  HPI:  Hailey Shields is a 72 y.o. year-old female with history of coronary artery disease status post inferior STEMI (03/2017), HTN, HLD, mild aortic stenosis, prior DVT/PE, and osteoarthritis, who presents for follow-up of coronary artery disease.  She was last seen by Benetta Spar, NP, in 04/2017.  She was doing well from a cardiac standpoint at that time.  She continued to have right knee pain and was following with orthopedics after preceding arthroplasty.  Today, Hailey Shields continues to feel well.  She has not had any chest pain or shortness of breath.  She notes occasional mild leg edema, which has been long-standing.  She is able to mow her lawn with a push mower now without any difficulty.  Her knee has also recovered well from her surgery.  She is tolerating dual antiplatelet therapy with aspirin and ticagrelor well.  She continues to have occasional arthralgias and wonders if taking additional aspirin is reasonable for pain control.  She has not had any bleeding.  She also denies palpitations, lightheadedness, orthopnea, and PND.  She is tolerating ezetimibe well, having been intolerant of multiple statins in the past.  Hailey Shields is concerned about a letter she recently received from her Medicare supplement plan stating that some of her hospitalization may not be covered.  --------------------------------------------------------------------------------------------------  Past Medical History:  Diagnosis Date  . CAD (coronary artery disease)    a. 03/2017 Inf STEMI/PCI: LM 20d, LAD 30ost, 60p, D1 50ost, LCX 30ost, RCA 100p (3.5x38 Resolute Onyx DES), 25m, RPDA fills vial L->R collats from 2nd septal, EF 55-65%, 1+MR.  Marland Kitchen Carpal tunnel syndrome of right wrist    effects thumb and first two fingers  .  Diastolic dysfunction    a. 03/2017 Echo: Gr2 DD. EF 50-55%.  Marland Kitchen History of chemotherapy 2004  COMPLETED  . 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 History of exercise stress test 03-24-2016   dr croitoru   per note Intermediate risk secondary to poor exercise effort, no ecg ischemia; needs nuclear stress test  . History of kidney stones   . History of ovarian cancer 2003--- per pt no recurrence   s/p  TAH w/ BSO and completed chemotherapy in 2004 , no radiation  . History of pulmonary embolus (PE) 08/2015   a. 08/2015 s/p L TKA-->DVT and PE.  Marland Kitchen History of transient ischemic attack (TIA) 2004   "just after chemotherapy"  . Hypertension   . Nonrheumatic aortic (valve) stenosis    per last echo 04-06-2016  mild stenosis w/ valve area 1.69cm^2  . OA (osteoarthritis)    a. s/p bilat TKA (L 2017, R 2019).  . Valvular heart disease    a. 03/2017 Echo: EF 50-55%, basal to mid inferior HK, Gr2 DD, mild AS/MR, mildly dil LA/RV.  Marland Kitchen Wears glasses    Past Surgical History:  Procedure Laterality Date  . CORONARY/GRAFT ACUTE MI REVASCULARIZATION N/A 04/14/2017   Procedure: Coronary/Graft Acute MI Revascularization;  Surgeon: Yvonne Kendall, MD;  Location: [facility];  Service: Cardiovascular;  Laterality: N/A;  . INSERTION OF MESH N/A 11/27/2014   Procedure: INSERTION OF MESH;  Surgeon: Darnell Level, MD;  Location: [facility];  Service: General;  Laterality: N/A;  . LAPAROTOMY N/A 11/27/2014   Procedure: EXPLORATORY LAPAROTOMY;  Surgeon: Darnell Level,  MD;  Location: [facility];  Service: General;  Laterality: N/A;  . LEFT HEART CATH AND CORONARY ANGIOGRAPHY N/A 04/14/2017   Procedure: LEFT HEART CATH AND CORONARY ANGIOGRAPHY;  Surgeon: Yvonne Kendall, MD;  Location: [facility];  Service: Cardiovascular;  Laterality: N/A;  . LUMBAR FUSION  2009  . TONSILLECTOMY  age 22  . TOTAL ABDOMINAL HYSTERECTOMY W/ BILATERAL SALPINGOOPHORECTOMY  2003   AND  CHOLECYSTECTOMY/  MULTIPLE LYMPH NODE DISSECTIONS  . TOTAL KNEE ARTHROPLASTY Left 08/27/2015   Procedure: LEFT TOTAL KNEE ARTHROPLASTY;  Surgeon: Durene Romans, MD;  Location: [facility];  Service: Orthopedics;  Laterality: Left;  . TOTAL KNEE ARTHROPLASTY Right 04/06/2017   Procedure: RIGHT TOTAL KNEE ARTHROPLASTY;  Surgeon: Durene Romans, MD;  Location: [facility];  Service: Orthopedics;  Laterality: Right;  70 mins  . TRANSTHORACIC ECHOCARDIOGRAM  04-06-2016  dr croitoru   ef 55-60%,  grade 1 diastolic dysfunction/  mild AV stenosis (valve area 1.69cm^2)/ trivial MR/  mild LAE/  mild TR  . UMBILICAL HERNIA REPAIR  2001  APPROX.  Marland Kitchen VENTRAL HERNIA REPAIR N/A 11/27/2014   Procedure: HERNIA REPAIR VENTRAL ADULT;  Surgeon: Darnell Level, MD;  Location: [facility];  Service: General;  Laterality: N/A;  . VENTRAL HERNIA REPAIR  2008 APPROX.    Current Meds  Medication Sig  . acetaminophen (TYLENOL) 500 MG tablet Take 2 tablets (1,000 mg total) by mouth every 8 (eight) hours.  Marland Kitchen aspirin EC 81 MG tablet Take 81 mg by mouth daily.  . clopidogrel (PLAVIX) 75 MG tablet Take 1 tablet (75 mg total) by mouth daily.  Marland Kitchen ezetimibe (ZETIA) 10 MG tablet Take 1 tablet (10 mg total) by mouth daily.  Marland Kitchen lisinopril (PRINIVIL,ZESTRIL) 20 MG tablet Take 1 tablet (20 mg total) by mouth daily.  . metoprolol tartrate (LOPRESSOR) 25 MG tablet Take 1 tablet (25 mg total) by mouth 2 (two) times daily.  . Multiple Vitamin (MULTIVITAMIN) tablet Take 1 tablet by mouth daily.  . nitroGLYCERIN (NITROSTAT) 0.4 MG SL tablet Place 1 tablet (0.4 mg total) under the tongue every 5 (five) minutes as needed for chest pain. Maximum 3 doses.    Allergies: Codeine; Hydrocodone; Oxycodone; Prednisone; Statins; Tramadol; and Zithromax [azithromycin]  Social History   Tobacco Use  . Smoking status: Never Smoker  . Smokeless tobacco: Never Used  Substance Use Topics  . Alcohol use: No  . Drug use: No    Family History  Problem Relation Age of  Onset  . Heart attack Brother     Review of Systems: A 12-system review of systems was performed and was negative except as noted in the HPI.  --------------------------------------------------------------------------------------------------  Physical Exam: BP (!) 146/80 (BP Location: Left Arm, Patient Position: Sitting, Cuff Size: Normal)   Pulse (!) 55   Ht  (1.6 m)   Wt 194 lb 12 oz (88.3 kg)   BMI 34.50 kg/m   Repeat BP 138/70  General: NAD. HEENT: No conjunctival pallor or scleral icterus. Moist mucous membranes.  OP clear. Neck: Supple without lymphadenopathy, thyromegaly, JVD, or HJR. No carotid bruit. Lungs: Normal work of breathing. Clear to auscultation bilaterally without wheezes or crackles. Heart: Bradycardic but regular with 2/6 crescendo-decrescendo systolic murmur across the precordium.  No rubs or gallops.  Unable to assess PMI due to body habitus. Abd: Bowel sounds present. Soft, NT/ND.  Unable to assess HSM due to body habitus.  Ext: No lower extremity edema. Radial, PT, and DP pulses are 2+ bilaterally. Skin: Warm  and dry without rash.  EKG: Sinus bradycardia with isolated PACs.  Left axis deviation.  Borderline LVH with poor R wave progression and nonspecific T wave changes.  Lab Results  Component Value Date   WBC 5.5 04/16/2017   HGB 12.5 04/16/2017   HCT 37.2 04/16/2017   MCV 93.3 04/16/2017   PLT 233 04/16/2017    Lab Results  Component Value Date   NA 136 04/16/2017   K 4.1 04/16/2017   CL 104 04/16/2017   CO2 23 04/16/2017   BUN 10 04/16/2017   CREATININE 0.50 04/16/2017   GLUCOSE 110 (H) 04/16/2017   ALT 23 04/14/2017    Lab Results  Component Value Date   CHOL 210 (H) 04/14/2017   HDL 48 04/14/2017   LDLCALC 127 (H) 04/14/2017   TRIG 177 (H) 04/14/2017   CHOLHDL 4.4 04/14/2017    --------------------------------------------------------------------------------------------------  ASSESSMENT AND PLAN: Coronary artery disease  without angina Hailey Shields has recovered well from her inferior STEMI in February.  We will complete 12 months of dual antiplatelet therapy with aspirin and clopidogrel (ticagrelor not used as patient was on apixaban as well following procedure; apixaban now discontinued).  We will also continue current doses of metoprolol and ezetimibe for secondary prevention, given history of statin intolerance.  We will have cardiac rehab reach out to Hailey Shields again to facilitate enrollment in the program.  I have advised Hailey Shields to reach out to her insurance company to determine why some of her hospitalization in February may not be covered.  Aortic stenosis Mild aortic stenosis noted by recent echo, consistent with systolic murmur on exam.  No symptoms to suggest severe AS.  Continue with clinical follow-up.  Hyperlipidemia Hailey Shields is tolerating ezetimibe well, having been intolerant of multiple statins due to myalgias.  LDL at the time of inferior MI was 127.  We will repeat a lipid panel and CMP at her convenience.  If LDL remains above goal, we will need to consider adding a PCSK9 inhibitor.  Hypertension Blood pressure mildly elevated today, though somewhat improved on recheck.  I encouraged sodium restriction and exercise.  No medication changes today; continue lisinopril and metoprolol.  Follow-up: Return to clinic in 3 months.  Yvonne Kendall, MD 06/24/2017 10:24 AM

## 2017-09-30 ENCOUNTER — Other Ambulatory Visit
Admission: RE | Admit: 2017-09-30 | Discharge: 2017-09-30 | Disposition: A | Discharge status: Home / Self Care | Payer: Medicare Other | Source: Ambulatory Visit | Attending: Internal Medicine | Admitting: Internal Medicine

## 2017-09-30 DIAGNOSIS — I2510 Atherosclerotic heart disease of native coronary artery without angina pectoris: Secondary | ICD-10-CM | POA: Diagnosis present

## 2017-09-30 LAB — LIPID PANEL
CHOLESTEROL: 220 mg/dL — AB (ref 0–200)
HDL: 65 mg/dL (ref >40)
LDL Cholesterol: 138 mg/dL — ABNORMAL HIGH (ref 0–99)
TRIGLYCERIDES: 85 mg/dL (ref <150)
Total CHOL/HDL Ratio: 3.4 RATIO
VLDL: 17 mg/dL (ref 0–40)

## 2017-09-30 LAB — COMPREHENSIVE METABOLIC PANEL
ALK PHOS: 65 U/L (ref 38–126)
ALT: 17 U/L (ref 0–44)
AST: 22 U/L (ref 15–41)
Albumin: 4.4 g/dL (ref 3.5–5.0)
Anion gap: 6 (ref 5–15)
BUN: 11 mg/dL (ref 8–23)
CALCIUM: 9.2 mg/dL (ref 8.9–10.3)
CO2: 26 mmol/L (ref 22–32)
CREATININE: 0.58 mg/dL (ref 0.44–1.00)
Chloride: 109 mmol/L (ref 98–111)
GFR calc Af Amer: >60 mL/min (ref >60)
Glucose, Bld: 112 mg/dL — ABNORMAL HIGH (ref 70–99)
Potassium: 4.1 mmol/L (ref 3.5–5.1)
Sodium: 141 mmol/L (ref 135–145)
Total Bilirubin: 0.7 mg/dL (ref 0.3–1.2)
Total Protein: 7.1 g/dL (ref 6.5–8.1)

## 2017-10-12 NOTE — Progress Notes (Signed)
Follow-up Outpatient Visit Date: 10/13/2017  Primary Care Provider: Olen CordialBissette, Steven, MD [address] [place] [id number]  Chief Complaint: Follow-up coronary artery disease and hyperlipidemia  HPI:  [name] is a 72 y.o. year-old female with history of coronary artery disease status post inferior STEMI (03/2017), HTN, HLD, mild aortic stenosis, prior DVT/PE, and osteoarthritis, who presents for follow-up of coronary artery disease.  I last saw her in May, at which time she was feeling well.  She noted intermittent leg swelling, which had been a longstanding issue for her.  She remained on ezetimibe without adverse effects, having been intolerant of multiple statins in the past.  Lipid panel earlier this month showed suboptimal LDL (138; goal < 70).  Today, Ms. Humphres is concerned about "low blood sugar" episodes (though she does not have a history of diabetes nor does she take any medications that would actually lower her glucose).  She describes intermittent episodes of fatigue, lightheadedness, and palpitations and is concerned that her cardiac medications may be contributing to this (in particular metoprolol given pharmacy handout describing that metoprolol can mask symptoms of hypoglycemia).  She has not had any chest pain or shortness of breath.  She remains on ezetimibe, which she is tolerating well.  She is not interested in using a PCSK9 inhibitor, stating that she thinks her cholesterol is okay and that she does not think it will cause her any future cardiovascular problems.  --------------------------------------------------------------------------------------------------  Past Medical History:  Diagnosis Date  . CAD (coronary artery disease)    a. 03/2017 Inf STEMI/PCI: LM 20d, LAD 30ost, 60p, D1 50ost, LCX 30ost, RCA 100p (3.5x38 Resolute Onyx DES), 4792m, RPDA fills vial L->R collats from 2nd septal, EF 55-65%, 1+MR.  Marland Kitchen. Carpal tunnel syndrome of right wrist    effects thumb and first two fingers  . Diastolic dysfunction    a. 03/2017 Echo: Gr2 DD. EF 50-55%.  Marland Kitchen. History of chemotherapy 2004  COMPLETED  . 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 History of exercise stress test 03-24-2016   dr croitoru   per note Intermediate risk secondary to poor exercise effort, no ecg ischemia; needs nuclear stress test  . History of kidney stones   . History of ovarian cancer 2003--- per pt no recurrence   s/p  TAH w/ BSO and completed chemotherapy in 2004 , no radiation  . History of pulmonary embolus (PE) 08/2015   a. 08/2015 s/p L TKA-->DVT and PE.  Marland Kitchen. History of transient ischemic attack (TIA) 2004   "just after chemotherapy"  . Hypertension   . Nonrheumatic aortic (valve) stenosis    per last echo 04-06-2016  mild stenosis w/ valve area 1.69cm^2  . OA (osteoarthritis)    a. s/p bilat TKA (L 2017, R 2019).  . Valvular heart disease    a. 03/2017 Echo: EF 50-55%, basal to mid inferior HK, Gr2 DD, mild AS/MR, mildly dil LA/RV.  Marland Kitchen. Wears glasses    Past Surgical History:  Procedure Laterality Date  . CORONARY/GRAFT ACUTE MI REVASCULARIZATION N/A 04/14/2017   Procedure: Coronary/Graft Acute MI Revascularization;  Surgeon: Yvonne KendallEnd, Tykiera Raven, MD;  Location: [facility];  Service: Cardiovascular;  Laterality: N/A;  . INSERTION OF MESH N/A 11/27/2014   Procedure: INSERTION OF MESH;  Surgeon: Darnell Levelodd Gerkin, MD;  Location: [facility];  Service: General;  Laterality: N/A;  . LAPAROTOMY N/A 11/27/2014   Procedure: EXPLORATORY LAPAROTOMY;  Surgeon: Darnell Levelodd Gerkin, MD;  Location: [facility];  Service: General;  Laterality: N/A;  . LEFT HEART CATH AND CORONARY ANGIOGRAPHY N/A 04/14/2017   Procedure: LEFT HEART CATH AND CORONARY ANGIOGRAPHY;  Surgeon: Yvonne Kendall, MD;  Location: [facility];  Service: Cardiovascular;  Laterality: N/A;  . LUMBAR FUSION  2009  . TONSILLECTOMY  age 87  . TOTAL ABDOMINAL HYSTERECTOMY W/ BILATERAL  SALPINGOOPHORECTOMY  2003   AND CHOLECYSTECTOMY/  MULTIPLE LYMPH NODE DISSECTIONS  . TOTAL KNEE ARTHROPLASTY Left 08/27/2015   Procedure: LEFT TOTAL KNEE ARTHROPLASTY;  Surgeon: Durene Romans, MD;  Location: [facility];  Service: Orthopedics;  Laterality: Left;  . TOTAL KNEE ARTHROPLASTY Right 04/06/2017   Procedure: RIGHT TOTAL KNEE ARTHROPLASTY;  Surgeon: Durene Romans, MD;  Location: [facility];  Service: Orthopedics;  Laterality: Right;  70 mins  . TRANSTHORACIC ECHOCARDIOGRAM  04-06-2016  dr croitoru   ef 55-60%,  grade 1 diastolic dysfunction/  mild AV stenosis (valve area 1.69cm^2)/ trivial MR/  mild LAE/  mild TR  . UMBILICAL HERNIA REPAIR  2001  APPROX.  Marland Kitchen VENTRAL HERNIA REPAIR N/A 11/27/2014   Procedure: HERNIA REPAIR VENTRAL ADULT;  Surgeon: Darnell Level, MD;  Location: [facility];  Service: General;  Laterality: N/A;  . VENTRAL HERNIA REPAIR  2008 APPROX.    Current Meds  Medication Sig  . acetaminophen (TYLENOL) 500 MG tablet Take 2 tablets (1,000 mg total) by mouth every 8 (eight) hours.  Marland Kitchen aspirin EC 81 MG tablet Take 81 mg by mouth daily.  . clopidogrel (PLAVIX) 75 MG tablet Take 1 tablet (75 mg total) by mouth daily.  Marland Kitchen ezetimibe (ZETIA) 10 MG tablet Take 1 tablet (10 mg total) by mouth daily.  Marland Kitchen lisinopril (PRINIVIL,ZESTRIL) 20 MG tablet Take 1 tablet (20 mg total) by mouth daily.  . metoprolol tartrate (LOPRESSOR) 25 MG tablet Take 1 tablet (25 mg total) by mouth 2 (two) times daily.  . Multiple Vitamin (MULTIVITAMIN) tablet Take 1 tablet by mouth daily.  . nitroGLYCERIN (NITROSTAT) 0.4 MG SL tablet Place 1 tablet (0.4 mg total) under the tongue every 5 (five) minutes as needed for chest pain. Maximum 3 doses.    Allergies: Codeine; Hydrocodone; Oxycodone; Prednisone; Statins; Tramadol; and Zithromax [azithromycin]  Social History   Tobacco Use  . Smoking status: Never Smoker  . Smokeless tobacco: Never Used  Substance Use Topics  . Alcohol use: No  . Drug use: No    Family  History  Problem Relation Age of Onset  . Heart attack Brother     Review of Systems: A 12-system review of systems was performed and was negative except as noted in the HPI.  --------------------------------------------------------------------------------------------------  Physical Exam: BP (!) 158/82 (BP Location: Left Arm, Patient Position: Sitting, Cuff Size: Large)   Pulse (!) 59   Ht 5\' 3"  (1.6 m)   Wt 190 lb 12 oz (86.5 kg)   BMI 33.79 kg/m   General: NAD. HEENT: No conjunctival pallor or scleral icterus. Moist mucous membranes.  OP clear. Neck: Supple without lymphadenopathy, thyromegaly, JVD, or HJR. Lungs: Normal work of breathing. Clear to auscultation bilaterally without wheezes or crackles. Heart: Regular rate and rhythm without murmurs, rubs, or gallops. Non-displaced PMI. Abd: Bowel sounds present. Soft, NT/ND without hepatosplenomegaly Ext: No lower extremity edema. Skin: Warm and dry without rash.  EKG: Sinus bradycardia (heart rate 59 bpm) with left axis deviation and borderline LVH.    Lab Results  Component Value Date   WBC 5.5 04/16/2017   HGB 12.5 04/16/2017  HCT 37.2 04/16/2017   MCV 93.3 04/16/2017   PLT 233 04/16/2017    Lab Results  Component Value Date   NA 141 09/30/2017   K 4.1 09/30/2017   CL 109 09/30/2017   CO2 26 09/30/2017   BUN 11 09/30/2017   CREATININE 0.58 09/30/2017   GLUCOSE 112 (H) 09/30/2017   ALT 17 09/30/2017    Lab Results  Component Value Date   CHOL 220 (H) 09/30/2017   HDL 65 09/30/2017   LDLCALC 138 (H) 09/30/2017   TRIG 85 09/30/2017   CHOLHDL 3.4 09/30/2017    --------------------------------------------------------------------------------------------------  ASSESSMENT AND PLAN: Coronary artery disease without angina No symptoms to suggest worsening coronary insufficiency status post inferior STEMI and primary PCI to the RCA in February.  She was previously intolerant of ticagrelor but seems to be  doing okay with aspirin and clopidogrel.  She will need to continue at least 12 months of DAPT from the time of her MI.  We will continue with secondary prevention using ezetimibe, as the patient is intolerant of statins and unwilling to try a PCSK9 inhibitor despite a lengthy discussion regarding risks and benefits of the medication today.  Hypertension Blood pressure suboptimally controlled.  Patient is concerned that some of her medications, and particularly metoprolol, could be causing intermittent lightheadedness, palpitations, and fatigue.  I do not believe that this represents hypoglycemia, given that she does not have diabetes nor is she on any medications that would lower her blood sugar.  Additionally, recent fasting BMP showed a mildly elevated glucose of 112.  Given a borderline low resting heart rate, we have agreed to discontinue metoprolol.  I have advised Ms. Daughenbaugh to minimize her sodium intake and to monitor her blood pressure at home at least 3 days a week.  If it is consistently greater than 140/90, she should contact us to discuss further treatment options (escalation of lisinopril or addition of another agent).  Hyperlipidemia Recent lipid panel showed LDL was not well controlled given her history of CAD and STEMI earlier this year (goal LDL less than 70).  We discussed the importance of lifestyle modifications as well as pharmacotherapy.  She is tolerating ezetimibe, though it is not able to get her to goal.  She is has been intolerant of multiple statins and is unwilling to try them again.  We discussed addition of a PCSK9 inhibitor, but Ms. Casserly is adamant that she would like to avoid this as well.  We will readdress this again when she returns for follow-up.  Follow-up: Return to clinic in 6 months, sooner if symptoms worsen or blood pressure is elevated on home checks.  Yvonne Kendall, MD 10/13/2017 10:27 AM

## 2017-10-13 ENCOUNTER — Encounter: Payer: Self-pay | Admitting: Internal Medicine

## 2017-10-13 ENCOUNTER — Ambulatory Visit (INDEPENDENT_AMBULATORY_CARE_PROVIDER_SITE_OTHER): Payer: Medicare Other | Admitting: Internal Medicine

## 2017-10-13 VITALS — BP 158/82 | HR 59 | Ht 63.0 in | Wt 190.8 lb

## 2017-10-13 DIAGNOSIS — I2510 Atherosclerotic heart disease of native coronary artery without angina pectoris: Secondary | ICD-10-CM

## 2017-10-13 DIAGNOSIS — I10 Essential (primary) hypertension: Secondary | ICD-10-CM

## 2017-10-13 DIAGNOSIS — E785 Hyperlipidemia, unspecified: Secondary | ICD-10-CM | POA: Diagnosis not present

## 2017-10-13 NOTE — Patient Instructions (Signed)
Medication Instructions:  Your physician has recommended you make the following change in your medication:  1- STOP taking Metoprolol.   Labwork: none  Testing/Procedures: none  Follow-Up: Your physician recommends that you schedule a follow-up appointment in: 6 MONTHS WITH DR END.   Any Other Special Instructions Will Be Listed Below (If Applicable).  Monitor your blood pressure at home about 3 times a week over the next month. Please record your blood pressure and heart rate. If it is consistently higher than 140/90, then please give us a call to let us know.   If you need a refill on your cardiac medications before your next appointment, please call your pharmacy.

## 2017-10-14 ENCOUNTER — Encounter: Payer: Self-pay | Admitting: Internal Medicine

## 2018-04-18 ENCOUNTER — Other Ambulatory Visit: Payer: Self-pay

## 2018-04-18 ENCOUNTER — Telehealth: Payer: Self-pay

## 2018-04-18 MED ORDER — EZETIMIBE 10 MG PO TABS: 10.0000 mg | ORAL_TABLET | Freq: Every day | ORAL | 3 refills | Status: DC

## 2018-04-18 MED ORDER — CLOPIDOGREL BISULFATE 75 MG PO TABS: 75.0000 mg | ORAL_TABLET | Freq: Every day | ORAL | 3 refills | Status: AC

## 2018-04-18 NOTE — Telephone Encounter (Signed)
Refill sent for Plavix 75 mg  

## 2018-11-03 ENCOUNTER — Ambulatory Visit (INDEPENDENT_AMBULATORY_CARE_PROVIDER_SITE_OTHER): Payer: Medicare Other | Admitting: Internal Medicine

## 2018-11-03 ENCOUNTER — Encounter: Payer: Self-pay | Admitting: Internal Medicine

## 2018-11-03 ENCOUNTER — Other Ambulatory Visit: Payer: Self-pay

## 2018-11-03 VITALS — BP 140/90 | HR 71 | Ht 63.0 in | Wt 195.0 lb

## 2018-11-03 DIAGNOSIS — E785 Hyperlipidemia, unspecified: Secondary | ICD-10-CM

## 2018-11-03 DIAGNOSIS — I10 Essential (primary) hypertension: Secondary | ICD-10-CM | POA: Diagnosis not present

## 2018-11-03 DIAGNOSIS — I2510 Atherosclerotic heart disease of native coronary artery without angina pectoris: Secondary | ICD-10-CM | POA: Diagnosis not present

## 2018-11-03 MED ORDER — LISINOPRIL 40 MG PO TABS: 40.0000 mg | ORAL_TABLET | Freq: Every day | ORAL | 3 refills | Status: DC

## 2018-11-03 NOTE — Patient Instructions (Addendum)
Medication Instructions:  1- INCREASE Lisinopril to 1 tablet ( 40 mg total) once daily If you need a refill on your cardiac medications before your next appointment, please call your pharmacy.   Lab work: Your physician recommends that you return for lab work in:2 weeks at the medical mall. ([facility]) No appt is needed. Hours are M-F 7AM- 6 PM.  If you have labs (blood work) drawn today and your tests are completely normal, you will receive your results only by: Marland Kitchen. MyChart Message (if you have MyChart) OR . A paper copy in the mail If you have any lab test that is abnormal or we need to change your treatment, we will call you to review the results.  Testing/Procedures: None ordered   Follow-Up: At [facility], you and your health needs are our priority.  As part of our continuing mission to provide you with exceptional heart care, we have created designated Provider Care Teams.  These Care Teams include your primary Cardiologist (physician) and Advanced Practice Providers (APPs -  Physician Assistants and Nurse Practitioners) who all work together to provide you with the care you need, when you need it. You will need a follow up appointment in 6 months.  Please call our office 2 months in advance to schedule this appointment.  You may see Dr. Okey DupreEnd or one of the following Advanced Practice Providers on your designated Care Team:   Nicolasa Duckinghristopher Berge, NP Eula Listenyan Dunn, PA-C . Marisue IvanJacquelyn Visser, PA-C  What You Need to Know About PCSK9 Inhibitors If you need medicine to lower your cholesterol, chances are your doctor will prescribe a statin. But these drugs aren't always enough. And some people can't take them because of their side effects. That's why experts are calling new drugs known as PCSK9 inhibitors a game-changer.  PCSK9 inhibitors are a new class of drugs that lower LDL, or "bad," cholesterol. Right now, there are two FDA-approved medications: alirocumab (Praluent) and evolocumab  (Repatha).  Studies show that PCSK9 inhibitors have a powerful effect and in some cases can actually prevent heart attacks or strokes. They can be taken on their own or in addition to a statin. But they're also much more expensive than other cholesterol drugs.  How Do They Work? PCSK9 inhibitors are proteins made in a laboratory. They target other proteins in your body, specifically your liver.  Your liver cells have receptors that sweep away excess cholesterol. But another protein called PCSK9 destroys them. That's where inhibitors come in. They latch onto PCSK9 proteins and block them from acting. The result: More receptors are able to do their job. This lowers the amount of LDL cholesterol in your blood.  In fact, one review of studies found that PCSK9 inhibitors slash LDL levels by an average of 47%. This protects your heart: The drugs were shown to reduce the risk of heart attack by 27%.  What Makes Them Different? Statins work in a different way. One main way they work is to block an enzyme that your liver needs to make cholesterol. This lowers your LDL level. Statins are taken as pills, while PCSK9 inhibitors come only as shots.  PCSK9 inhibitors can be used with statins to boost their benefit. The combination can lower LDL levels by more than half.  Who Should Take Them? Statins are the most common drug that lowers cholesterol. Doctors usually prescribe them first because they get the job done and most people can afford them.  But they don't work for everyone. You may want  to ask your doctor about PCSK9 inhibitors if:  You have genetic high cholesterol: About 600 thousand Americans have a genetic disorder called familial hypercholesterolemia. It affects how your body processes cholesterol. For most people with this condition, even the highest dose of statins won't bring their LDL cholesterol down to a healthy level. You're at risk for heart attack or stroke: If you already have heart  disease and other medications, like statins, aren't working, your doctor may consider PCSK9 inhibitors. You can't take statins: About 10% to 20% of people who do have side effects, like muscle pain, cramps, or weakness. They can also cause liver damage, confusion, memory loss, or a rise in blood sugar. Ref [employer]Dot[employer].si

## 2018-11-03 NOTE — Progress Notes (Addendum)
Follow-up Outpatient Visit Date: 11/03/2018  Primary Care Provider: Olen CordialBissette, Steven, MD [address] [place] [id number]  Chief Complaint: Follow-up coronary artery disease  HPI:  [name] is a 73 y.o. year-old female with history of coronary artery disease status post inferior STEMI (03/2017), HTN, HLD, mild aortic stenosis, prior DVT/PE, and osteoarthritis, who presents for follow-up of coronary artery disease and aortic stenosis.  I last saw her in 09/2017, at which time she was concerned about low blood sugars (though she does not have a history of diabetes mellitus nor does she take hypoglycemic agents).  She reported intermittent episodes of fatigue, lightheadedness, and palpitations.  She has been intolerant of statins in the past and did not wish to pursue PCSK9 inhibitor therapy despite suboptimal lipid control on ezetimibe.  Due to fatigue, we agreed to discontinue metoprolol.  Today, Hailey Shields reports that she is feeling well.  Energy has improved with discontinuation of metoprolol.  She denies chest pain, shortness of breath, palpitations, lightheadedness, and edema.  She believes that her blood pressure is elevated today due to parking issues before her visit.  She notes that her home blood pressure is typically around 140/70.  She is tolerating her current medications well.  She recently [facility], [place] to be closer to family.  --------------------------------------------------------------------------------------------------  Past Medical History:  Diagnosis Date  . CAD (coronary artery disease)    a. 03/2017 Inf STEMI/PCI: LM 20d, LAD 30ost, 60p, D1 50ost, LCX 30ost, RCA 100p (3.5x38 Resolute Onyx DES), 8271m, RPDA fills vial L->R collats from 2nd septal, EF 55-65%, 1+MR.  Marland Kitchen. Carpal tunnel syndrome of right wrist    effects thumb and first two fingers  . Diastolic dysfunction     a. 03/2017 Echo: Gr2 DD. EF 50-55%.  Marland Kitchen. History of chemotherapy 2004  COMPLETED  . 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 History of exercise stress test 03-24-2016   dr croitoru   per note Intermediate risk secondary to poor exercise effort, no ecg ischemia; needs nuclear stress test  . History of kidney stones   . History of ovarian cancer 2003--- per pt no recurrence   s/p  TAH w/ BSO and completed chemotherapy in 2004 , no radiation  . History of pulmonary embolus (PE) 08/2015   a. 08/2015 s/p L TKA-->DVT and PE.  Marland Kitchen. History of transient ischemic attack (TIA) 2004   "just after chemotherapy"  . Hypertension   . Nonrheumatic aortic (valve) stenosis    per last echo 04-06-2016  mild stenosis w/ valve area 1.69cm^2  . OA (osteoarthritis)    a. s/p bilat TKA (L 2017, R 2019).  . Valvular heart disease    a. 03/2017 Echo: EF 50-55%, basal to mid inferior HK, Gr2 DD, mild AS/MR, mildly dil LA/RV.  Marland Kitchen. Wears glasses    Past Surgical History:  Procedure Laterality Date  . CORONARY/GRAFT ACUTE MI REVASCULARIZATION N/A 04/14/2017   Procedure: Coronary/Graft Acute MI Revascularization;  Surgeon: Yvonne KendallEnd, Lionel Woodberry, MD;  Location: [facility] INVASIVE CV LAB;  Service: Cardiovascular;  Laterality: N/A;  . INSERTION OF MESH N/A 11/27/2014   Procedure: INSERTION OF MESH;  Surgeon: Darnell Levelodd Gerkin, MD;  Location: [facility];  Service: General;  Laterality: N/A;  . LAPAROTOMY N/A 11/27/2014   Procedure: EXPLORATORY LAPAROTOMY;  Surgeon: Darnell Levelodd Gerkin, MD;  Location: [facility];  Service:  General;  Laterality: N/A;  . LEFT HEART CATH AND CORONARY ANGIOGRAPHY N/A 04/14/2017   Procedure: LEFT HEART CATH AND CORONARY ANGIOGRAPHY;  Surgeon: Yvonne Kendall, MD;  Location: [facility] INVASIVE CV LAB;  Service: Cardiovascular;  Laterality: N/A;  . LUMBAR FUSION  2009  . TONSILLECTOMY  age 20  . TOTAL ABDOMINAL HYSTERECTOMY W/ BILATERAL SALPINGOOPHORECTOMY  2003   AND CHOLECYSTECTOMY/  MULTIPLE  LYMPH NODE DISSECTIONS  . TOTAL KNEE ARTHROPLASTY Left 08/27/2015   Procedure: LEFT TOTAL KNEE ARTHROPLASTY;  Surgeon: Durene Romans, MD;  Location: [facility];  Service: Orthopedics;  Laterality: Left;  . TOTAL KNEE ARTHROPLASTY Right 04/06/2017   Procedure: RIGHT TOTAL KNEE ARTHROPLASTY;  Surgeon: Durene Romans, MD;  Location: [facility];  Service: Orthopedics;  Laterality: Right;  70 mins  . TRANSTHORACIC ECHOCARDIOGRAM  04-06-2016  dr croitoru   ef 55-60%,  grade 1 diastolic dysfunction/  mild AV stenosis (valve area 1.69cm^2)/ trivial MR/  mild LAE/  mild TR  . UMBILICAL HERNIA REPAIR  2001  APPROX.  Marland Kitchen VENTRAL HERNIA REPAIR N/A 11/27/2014   Procedure: HERNIA REPAIR VENTRAL ADULT;  Surgeon: Darnell Level, MD;  Location: [facility];  Service: General;  Laterality: N/A;  . VENTRAL HERNIA REPAIR  2008 APPROX.    Current Meds  Medication Sig  . aspirin EC 81 MG tablet Take 81 mg by mouth daily.  . chlorpheniramine (CHLOR-TRIMETON) 4 MG tablet Take 4 mg by mouth daily.  . clopidogrel (PLAVIX) 75 MG tablet Take 1 tablet (75 mg total) by mouth daily.  Marland Kitchen ezetimibe (ZETIA) 10 MG tablet Take 1 tablet (10 mg total) by mouth daily.  Marland Kitchen lisinopril (PRINIVIL,ZESTRIL) 20 MG tablet Take 1 tablet (20 mg total) by mouth daily.  . Multiple Vitamin (MULTIVITAMIN) tablet Take 1 tablet by mouth daily.  . nitroGLYCERIN (NITROSTAT) 0.4 MG SL tablet Place 1 tablet (0.4 mg total) under the tongue every 5 (five) minutes as needed for chest pain. Maximum 3 doses.    Allergies: Codeine, Hydrocodone, Oxycodone, Prednisone, Statins, Tramadol, and Zithromax [azithromycin]  Social History   Tobacco Use  . Smoking status: Never Smoker  . Smokeless tobacco: Never Used  Substance Use Topics  . Alcohol use: No  . Drug use: No    Family History  Problem Relation Age of Onset  . Heart attack Brother     Review of Systems: A 12-system review of systems was performed and was negative except as noted in the HPI.   --------------------------------------------------------------------------------------------------  Physical Exam: BP 140/90 (BP Location: Left Arm, Patient Position: Sitting, Cuff Size: Large)   Pulse 71   Ht 5\' 3"  (1.6 m)   Wt 195 lb (88.5 kg)   SpO2 98%   BMI 34.54 kg/m   General: NAD. HEENT: No conjunctival pallor or scleral icterus.  Facemask in place. Neck: Supple without lymphadenopathy, thyromegaly, JVD, or HJR. Lungs: Normal work of breathing. Clear to auscultation bilaterally without wheezes or crackles. Heart: Regular rate and rhythm with 2/6 systolic murmur, rubs, or gallops. Non-displaced PMI. Abd: Bowel sounds present. Soft, NT/ND without hepatosplenomegaly Ext: No lower extremity edema. Radial, PT, and DP pulses are 2+ bilaterally. Skin: Warm and dry without rash.  EKG: Normal sinus rhythm with left axis deviation and borderline LVH.  Lab Results  Component Value Date   WBC 5.5 04/16/2017   HGB 12.5 04/16/2017   HCT 37.2 04/16/2017   MCV 93.3 04/16/2017   PLT 233 04/16/2017    Lab Results  Component Value Date   NA 141 09/30/2017  K 4.1 09/30/2017   CL 109 09/30/2017   CO2 26 09/30/2017   BUN 11 09/30/2017   CREATININE 0.58 09/30/2017   GLUCOSE 112 (H) 09/30/2017   ALT 17 09/30/2017    Lab Results  Component Value Date   CHOL 220 (H) 09/30/2017   HDL 65 09/30/2017   LDLCALC 138 (H) 09/30/2017   TRIG 85 09/30/2017   CHOLHDL 3.4 09/30/2017    --------------------------------------------------------------------------------------------------  ASSESSMENT AND PLAN: Coronary artery disease: No symptoms to suggest worsening coronary insufficiency.  We will continue current medications for secondary prevention.  We have discussed the risks and benefits of long-term dual antiplatelet use and have agreed to continue with aspirin and clopidogrel.  Hyperlipidemia: Lipids remain suboptimally controlled, with LDL noted to be 131 on most recent check  through [facility] earlier this month.  We discussed further treatment options, including lifestyle modifications, PCSK9 inhibitor, and bempedoic acid, given the patient's history of statin intolerance.  She would like to discuss PCSK9 inhibitor therapy with her family before deciding.  We will continue ezetimibe and lifestyle modifications for now.  Hypertension: Blood pressure suboptimally controlled today as well as at home.  We have agreed to increase lisinopril to 40 mg daily.  We will recheck a basic metabolic panel in about 2 weeks.  Aortic stenosis: Mild on most recent echocardiogram in 03/2017.  No symptoms to suggest severe aortic stenosis.  Continue clinical follow-up.  Follow-up: Return to clinic in 6 months.  Nelva Bush, MD 11/03/2018 9:56 AM

## 2019-04-10 ENCOUNTER — Other Ambulatory Visit: Payer: Self-pay | Admitting: *Deleted

## 2019-04-10 MED ORDER — EZETIMIBE 10 MG PO TABS: 10.0000 mg | ORAL_TABLET | Freq: Every day | ORAL | 0 refills | Status: DC

## 2019-06-28 ENCOUNTER — Telehealth: Payer: Self-pay | Admitting: Internal Medicine

## 2019-06-28 NOTE — Telephone Encounter (Signed)
3 attempts to schedule fu appt from recall list.   Deleting recall.   

## 2022-02-23 ENCOUNTER — Encounter: Payer: Self-pay | Admitting: Cardiovascular Disease

## 2022-02-23 ENCOUNTER — Ambulatory Visit: Payer: Medicare Other | Attending: Cardiovascular Disease | Admitting: Cardiovascular Disease

## 2022-02-23 VITALS — BP 134/81 | HR 68 | Ht 61.0 in | Wt 184.8 lb

## 2022-02-23 DIAGNOSIS — I10 Essential (primary) hypertension: Secondary | ICD-10-CM | POA: Diagnosis present

## 2022-02-23 DIAGNOSIS — I2510 Atherosclerotic heart disease of native coronary artery without angina pectoris: Secondary | ICD-10-CM

## 2022-02-23 DIAGNOSIS — E785 Hyperlipidemia, unspecified: Secondary | ICD-10-CM | POA: Insufficient documentation

## 2022-02-23 DIAGNOSIS — I2111 ST elevation (STEMI) myocardial infarction involving right coronary artery: Secondary | ICD-10-CM

## 2022-02-23 DIAGNOSIS — I350 Nonrheumatic aortic (valve) stenosis: Secondary | ICD-10-CM | POA: Diagnosis present

## 2022-02-23 NOTE — Progress Notes (Signed)
Cardiology Office Note:    Date:  02/24/2022   ID:  [name], DOB 1945-12-25, MRN [id number]  PCP:  Olen Cordial, MD   [facility] Providers Cardiologist:  Thurmon Fair, MD     Referring MD: Olen Cordial, MD   Chief Complaint  Patient presents with   Coronary Artery Disease   Cardiac Valve Problem    History of Present Illness:    Hailey Shields is a 77 y.o. 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  This is her first cardiology evaluation since 2020.  She has no active cardiovascular complaints.  She lives alone, takes care of 3 cats and has no limitations in performing household chores.  Can carry heavy bags of kitty litter and cat food without chest pain or shortness of breath, denies presyncope or syncope, palpitations, focal neurologic complaints, intermittent claudication, orthopnea, PND, leg edema.  Most of her complaints are orthopedic in nature.  Had some issues with vertigo that is well-controlled as long as she uses an antihistamine and saline nasal spray.  She has not been on any lipid-lowering medications.  She did not tolerate statins or ezetimibe due to muscle aches.  Her most recent labs performed 8 months ago showed total cholesterol 265, triglycerides 177, HDL 62 and calculated LDL 272.  She is not fasting today.  She does not have diabetes mellitus and does not smoke.  Her brother Dalene Seltzer is also my patient.  Past Medical History:  Diagnosis Date   CAD (coronary artery disease)    a. 03/2017 Inf STEMI/PCI: LM 20d, LAD 30ost, 60p, D1 50ost, LCX 30ost, RCA 100p (3.5x38 Resolute Onyx DES), 37m, RPDA fills vial L->R collats from 2nd septal, EF 55-65%, 1+MR.   Carpal tunnel syndrome of right wrist    effects thumb and first two fingers   Diastolic dysfunction    a. 03/2017  Echo: Gr2 DD. EF 50-55%.   History of chemotherapy 2004  COMPLETED   History of DVT of lower extremity 08/2015   post surgery  LEFT TOTAL KNEE   History of exercise stress test 03-24-2016   dr Zacharee Gaddie   per note Intermediate risk secondary to poor exercise effort, no ecg ischemia; needs nuclear stress test   History of kidney stones    History of ovarian cancer 2003--- per pt no recurrence   s/p  TAH w/ BSO and completed chemotherapy in 2004 , no radiation   History of pulmonary embolus (PE) 08/2015   a. 08/2015 s/p L TKA-->DVT and PE.   History of transient ischemic attack (TIA) 2004   "just after chemotherapy"   Hypertension    Nonrheumatic aortic (valve) stenosis    per last echo 04-06-2016  mild stenosis w/ valve area 1.69cm^2   OA (osteoarthritis)    a. s/p bilat TKA (L 2017, R 2019).   Valvular heart disease    a. 03/2017 Echo: EF 50-55%, basal to mid inferior HK, Gr2 DD, mild AS/MR, mildly dil LA/RV.   Wears glasses     Past Surgical History:  Procedure Laterality Date   CORONARY/GRAFT ACUTE MI REVASCULARIZATION N/A 04/14/2017   Procedure: Coronary/Graft Acute MI Revascularization;  Surgeon: Yvonne Kendall, MD;  Location: [facility]ASIVE [facility];  Service: Cardiovascular;  Laterality: N/A;   INSERTION OF MESH N/A 11/27/2014   Procedure: INSERTION OF MESH;  Surgeon: Darnell Level, MD;  Location: [facility];  Service: General;  Laterality: N/A;   LAPAROTOMY N/A 11/27/2014   Procedure: EXPLORATORY LAPAROTOMY;  Surgeon: Darnell Level, MD;  Location: [facility];  Service: General;  Laterality: N/A;   LEFT HEART CATH AND CORONARY ANGIOGRAPHY N/A 04/14/2017   Procedure: LEFT HEART CATH AND CORONARY ANGIOGRAPHY;  Surgeon: Yvonne Kendall, MD;  Location: [facility] IN[facility]ASIVE CV [facility];  Service: Cardiovascular;  Laterality: N/A;   LUMBAR FUSION  2009   TONSILLECTOMY  age 18   TOTAL ABDOMINAL HYSTERECTOMY W/ BILATERAL SALPINGOOPHORECTOMY  2003   AND CHOLECYSTECTOMY/  MULTIPLE LYMPH NODE DISSECTIONS   TOTAL  KNEE ARTHROPLASTY Left 08/27/2015   Procedure: LEFT TOTAL KNEE ARTHROPLASTY;  Surgeon: Durene Romans, MD;  Location: [facility];  Service: Orthopedics;  Laterality: Left;   TOTAL KNEE ARTHROPLASTY Right 04/06/2017   Procedure: RIGHT TOTAL KNEE ARTHROPLASTY;  Surgeon: Durene Romans, MD;  Location: [facility];  Service: Orthopedics;  Laterality: Right;  70 mins   TRANSTHORACIC ECHOCARDIOGRAM  04-06-2016  dr Aylene Acoff   ef 55-60%,  grade 1 diastolic dysfunction/  mild AV stenosis (valve area 1.69cm^2)/ trivial MR/  mild LAE/  mild TR   UMBILICAL HERNIA REPAIR  2001  APPROX.   VENTRAL HERNIA REPAIR N/A 11/27/2014   Procedure: HERNIA REPAIR VENTRAL ADULT;  Surgeon: Darnell Level, MD;  Location: [facility];  Service: General;  Laterality: N/A;   VENTRAL HERNIA REPAIR  2008 APPROX.    Current Medications: Current Meds  Medication Sig   aspirin EC 81 MG tablet Take 81 mg by mouth daily.   chlorpheniramine (CHLOR-TRIMETON) 4 MG tablet Take 4 mg by mouth daily.   lisinopril (ZESTRIL) 40 MG tablet Take 1 tablet (40 mg total) by mouth daily.   lisinopril-hydrochlorothiazide (ZESTORETIC) 20-12.5 MG tablet Take 1 tablet by mouth daily.     Allergies:   Codeine, Hydrocodone, Oxycodone, Prednisone, Statins, Tramadol, and Zithromax [azithromycin]   Social History   Socioeconomic History   Marital status: Widowed    Spouse name: Not on file   Number of children: Not on file   Years of education: Not on file   Highest education level: Not on file  Occupational History   Not on file  Tobacco Use   Smoking status: Never   Smokeless tobacco: Never  Vaping Use   Vaping Use: Never used  Substance and Sexual Activity   Alcohol use: No   Drug use: No   Sexual activity: Not on file  Other Topics Concern   Not on file  Social History Narrative   Not on file   Social Determinants of Health   Financial Resource Strain: Not on file  Food Insecurity: Not on file  Transportation Needs: Not on file  Physical  Activity: Not on file  Stress: Not on file  Social Connections: Not on file     Family History: The patient's family history includes Heart attack in her brother. (Had CABG)  ROS:   Please see the history of present illness.     All other systems reviewed and are negative.  EKGs/Labs/Other Studies Reviewed:    The following studies were reviewed today:  CATH 2019  Conclusions: Inferior STEMI with thrombotic occlusion of the proximal/mid RCA. Mild to moderate, non-critical disease involving the LMCA, LAD, and LCx. Basal and mid inferior akinesis with otherwise hyperdynamic LV contraction. LVEF 55-65%. Normal left ventricular filling pressure. Mild aortic stenosis (peak-to-peak gradient 15 mmHg). Successful primary PCI to the proximal and mid RCA using a  Resolute Onyx 3.5 x 38 mm drug-eluting stent with 0% residual stenosis and TIMI-3 flow.   Recommendations: Dual antiplatelet therapy for for at least 12 months, ideally with aspirin and ticagrelor. If apixaban needs to be restarted for DVT prophylaxis following knee surgery earlier this month, ticagrelor should be transitioned to clopidogrel and triple therapy (aspirin, clopidogrel, and apixaban) continued until it is felt safe to discontinue apixaban from a postoperative standpoint. Aggressive secondary prevention. Consider retrial of statins versus PCSK9 inhibitor therapy. Obtain transthoracic echocardiogram. Medical therapy for non-critical disease involving the LAD. If the patient develops angina, functional study or FFR could be considered to assess hemodynamic significance of the lesion.  ECHO 2019  - Left ventricle: The cavity size was normal. Systolic function was    normal. The estimated ejection fraction was in the range of 50%    to 55%. Hypokinesis of the basal to mid inferior wall. Features    are consistent with a pseudonormal left ventricular filling    pattern, with concomitant abnormal relaxation and increased     filling pressure (grade 2 diastolic dysfunction).  - Aortic valve: Transvalvular velocity was increased. There was    mild stenosis. Peak velocity (S): 235 cm/s. Mean gradient (S): 14    mm Hg. Peak gradient (S): 22 mm Hg.  - Mitral valve: There was mild regurgitation.  - Left atrium: The atrium was mildly dilated.  - Right ventricle: The cavity size was mildly dilated. Wall    thickness was normal. Systolic function was normal.  - Pulmonary arteries: Systolic pressure was within the normal    range.   EKG:  EKG is  ordered today.  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  There are no ischemic repolarization abnormalities.  Recent Labs: No results found for requested labs within last 365 days.  Recent Lipid Panel    Component Value Date/Time   CHOL 220 (H) 09/30/2017 0844   TRIG 85 09/30/2017 0844   HDL 65 09/30/2017 0844   CHOLHDL 3.4 09/30/2017 0844   VLDL 17 09/30/2017 0844   LDLCALC 138 (H) 09/30/2017 0844   06/27/2021 total cholesterol 265, triglycerides 177, HDL 62 and calculated LDL 161  Risk Assessment/Calculations:                Physical Exam:    VS:  BP 134/81 (BP Location: Right Arm, Patient Position: Sitting)   Pulse 68   Ht 5\' 1"  (1.549 m)   Wt 184 lb 12.8 oz (83.8 kg)   SpO2 99%   BMI 34.92 kg/m     Wt Readings from Last 3 Encounters:  02/23/22 184 lb 12.8 oz (83.8 kg)  11/03/18 195 lb (88.5 kg)  10/13/17 190 lb 12 oz (86.5 kg)     GEN: Mildly obese well nourished, well developed in no acute distress HEENT: Normal NECK: No JVD; carotid pulses are brisk but there are bilateral carotid bruits that probably radiate from the chest LYMPHATICS: No lymphadenopathy CARDIAC: RRR, normal S1 and distinct S2, 3/6 early to mid peaking aortic ejection murmur, no diastolic murmurs, rubs, gallops.  All distal pulses are normal including bilateral  DP/PT/radial/ulnar RESPIRATORY:  Clear to auscultation without rales, wheezing or rhonchi  ABDOMEN: Soft, non-tender, non-distended MUSCULOSKELETAL:  No edema; No deformity  SKIN: Warm and dry NEUROLOGIC:  Alert and oriented x 3 PSYCHIATRIC:  Normal affect   ASSESSMENT:    1. Coronary artery disease involving native  coronary artery of native heart without angina pectoris   2. Hyperlipidemia LDL goal <70   3. Nonrheumatic aortic valve stenosis   4. Essential hypertension    PLAN:    In order of problems listed above:  CAD: Currently asymptomatic.  Focus is on risk factor modification.  Continue aspirin. HLP: Developed statin myopathy and also has intolerance to ezetimibe.  Will need PCSK9 inhibitors as best option for lipid-lowering in order to achieve target LDL cholesterol less than 70..  Will refer to our clinical pharmacist to initiate therapy with Repatha. AS: Her physical exam suggest has been progression of her aortic stenosis but this remains nonsevere as she has a very distinct second heart sound.  No symptoms of aortic stenosis with physical activity.  Repeat echo.  Asked her to promptly report exertional angina/exertional dyspnea/exertional syncope. HTN: Adequate control           Medication Adjustments/Labs and Tests Ordered: Current medicines are reviewed at length with the patient today.  Concerns regarding medicines are outlined above.  Orders Placed This Encounter  Procedures   AMB Referral to [facility]   EKG 12-Lead   ECHOCARDIOGRAM COMPLETE   No orders of the defined types were placed in this encounter.   Patient Instructions  Medication Instructions:  Your physician recommends that you continue on your current medications as directed. Please refer to the Current Medication list given to you today.  *If you need a refill on your cardiac medications before your next appointment, please call your pharmacy*   Testing/Procedures: Your physician has  requested that you have an echocardiogram. Echocardiography is a painless test that uses sound waves to create images of your heart. It provides your doctor with information about the size and shape of your heart and how well your heart's chambers and valves are working. This procedure takes approximately one hour. There are no restrictions for this procedure. Please do NOT wear cologne, perfume, aftershave, or lotions (deodorant is allowed). Please arrive 15 minutes prior to your appointment time. This procedure will be done at [address] [place]    Follow-Up: At [facility], you and your health needs are our priority.  As part of our continuing mission to provide you with exceptional heart care, we have created designated Provider Care Teams.  These Care Teams include your primary Cardiologist (physician) and Advanced Practice Providers (APPs -  Physician Assistants and Nurse Practitioners) who all work together to provide you with the care you need, when you need it.  We recommend signing up for the patient portal called "MyChart".  Sign up information is provided on this After Visit Summary.  MyChart is used to connect with patients for Virtual Visits (Telemedicine).  Patients are able to view lab/test results, encounter notes, upcoming appointments, etc.  Non-urgent messages can be sent to your provider as well.   To learn more about what you can do with MyChart, go to [email or web address].    Your next appointment:   12 month(s)  The format for your next appointment:   In Person  Provider:   Thurmon Fair, MD     Other Instructions We will see you on Tuesday, January 16th to discuss cholesterol management with one of our Clinical Pharmacists.    Signed, Thurmon Fair, MD  02/24/2022 2:16 PM    [facility]

## 2022-02-23 NOTE — Patient Instructions (Addendum)
Medication Instructions:  Your physician recommends that you continue on your current medications as directed. Please refer to the Current Medication list given to you today.  *If you need a refill on your cardiac medications before your next appointment, please call your pharmacy*   Testing/Procedures: Your physician has requested that you have an echocardiogram. Echocardiography is a painless test that uses sound waves to create images of your heart. It provides your doctor with information about the size and shape of your heart and how well your heart's chambers and valves are working. This procedure takes approximately one hour. There are no restrictions for this procedure. Please do NOT wear cologne, perfume, aftershave, or lotions (deodorant is allowed). Please arrive 15 minutes prior to your appointment time. This procedure will be done at [address]    Follow-Up: At [facility], you and your health needs are our priority.  As part of our continuing mission to provide you with exceptional heart care, we have created designated Provider Care Teams.  These Care Teams include your primary Cardiologist (physician) and Advanced Practice Providers (APPs -  Physician Assistants and Nurse Practitioners) who all work together to provide you with the care you need, when you need it.  We recommend signing up for the patient portal called "MyChart".  Sign up information is provided on this After Visit Summary.  MyChart is used to connect with patients for Virtual Visits (Telemedicine).  Patients are able to view lab/test results, encounter notes, upcoming appointments, etc.  Non-urgent messages can be sent to your provider as well.   To learn more about what you can do with MyChart, go to [email or web address].    Your next appointment:   12 month(s)  The format for your next appointment:   In Person  Provider:   Sanda Klein, MD     Other Instructions We will  see you on Tuesday, January 16th to discuss cholesterol management with one of our Clinical Pharmacists.

## 2022-03-03 ENCOUNTER — Ambulatory Visit: Payer: Medicare Other | Attending: Cardiology | Admitting: Pharmacist Clinician (PhC)/ Clinical Pharmacy Specialist

## 2022-03-03 DIAGNOSIS — E785 Hyperlipidemia, unspecified: Secondary | ICD-10-CM | POA: Diagnosis present

## 2022-03-03 DIAGNOSIS — I2510 Atherosclerotic heart disease of native coronary artery without angina pectoris: Secondary | ICD-10-CM

## 2022-03-03 NOTE — Progress Notes (Signed)
Office Visit    Patient Name: Hailey Shields Date of Encounter: 03/04/2022  Primary Care Provider:  Ma Rings, MD Primary Cardiologist:  Sanda Klein, MD  Chief Complaint    Hyperlipidemia   Significant Past Medical History   CAD 2019 - inferior STEMI (DES to RCA)  HTN Currently controlled on lisinopril hctz  DVT/PE 2017 - Provoked after knee replacement      Allergies  Allergen Reactions   Codeine Other (See Comments)    Hallucinations   Hydrocodone Other (See Comments)    hallucinations   Oxycodone Other (See Comments)    hallucinations   Prednisone Other (See Comments)    Increase heart rate   Statins Other (See Comments)    Myalgia, weakness    Tramadol Nausea And Vomiting   Zithromax [Azithromycin] Other (See Comments)    Increase heart rate    History of Present Illness    Hailey Shields is a 77 y.o. female patient of Dr Sallyanne Kuster, in the office today to discuss options for cholesterol management.  She has tried multiple statins and ezetimibe in the past, but always developed myalgias.  She was seen by Dr. Sallyanne Kuster earlier this month after a 4 year absence from cardiology care    Insurance Carrier: [facility] plan [phone])  ID [id number]  BIN [id number]  PCN [id number]  GRP [id number]  LDL Cholesterol goal:  LDL < 70  Current Medications:   none  Previously tried:  pravastatin, rosuvastatin, atorvastatin, - myalgias,  ezetimibe  - myalgias  Family Hx:   2 paternal uncles, died in their 10's from MI's; mother died old age at 82, father at 65; brother with CABG; children 81, 48, son with stents - emergent situation after second Covid vaccine  Social Hx: Tobacco: no  Alcohol:   rare alcohol   Diet:    eats out only once weekly at [facility] nearby; good mix of protein, makes her own soups (vegetable beef and [place]), previously canned but has arthritis in hands  Exercise: tries to exercise (did have trouble with right foot), walks 1/2 mile 2-3 time per  week at least   Accessory Clinical Findings   Lipid labs from [facility] 06/2021:  TC 265, TG 177, HLD 62, LDL 171  Lab Results  Component Value Date   CHOL 220 (H) 09/30/2017   HDL 65 09/30/2017   LDLCALC 138 (H) 09/30/2017   TRIG 85 09/30/2017   CHOLHDL 3.4 09/30/2017    Lab Results  Component Value Date   ALT 17 09/30/2017   AST 22 09/30/2017   ALKPHOS 65 09/30/2017   BILITOT 0.7 09/30/2017   Lab Results  Component Value Date   CREATININE 0.58 09/30/2017   BUN 11 09/30/2017   NA 141 09/30/2017   K 4.1 09/30/2017   CL 109 09/30/2017   CO2 26 09/30/2017   Lab Results  Component Value Date   HGBA1C 5.5 04/14/2017    Home Medications    Current Outpatient Medications  Medication Sig Dispense Refill   aspirin EC 81 MG tablet Take 81 mg by mouth daily.     chlorpheniramine (CHLOR-TRIMETON) 4 MG tablet Take 4 mg by mouth daily.     lisinopril-hydrochlorothiazide (ZESTORETIC) 20-12.5 MG tablet Take 1 tablet by mouth daily.     nitroGLYCERIN (NITROSTAT) 0.4 MG SL tablet Place 1 tablet (0.4 mg total) under the tongue every 5 (five) minutes as needed for chest pain. Maximum 3 doses. (Patient not taking: Reported on  02/23/2022) 25 tablet 3   No current facility-administered medications for this visit.     Assessment & Plan    Hyperlipidemia LDL goal <70 Assessment: Patient with ASCVD not at LDL goal of < 70 Most recent LDL 171 on 06/2021 Not able to tolerate statins secondary to myalgias Reviewed options for lowering LDL cholesterol, including ezetimibe, PCSK-9 inhibitors, bempedoic acid and inclisiran.  Discussed mechanisms of action, dosing, side effects, potential decreases in LDL cholesterol and costs.  Also reviewed potential options for patient assistance.  Plan: Patient agreeable to starting Repatha 140 mg q14d Repeat labs after:  3 months Lipid Liver function Patient was given information on [place] grant/signed up for [employer] while  in office today.   Tommy Medal, PharmD CPP [facility] [address] [place]  [place], [place] [address] [phone]  03/04/2022, 3:27 PM

## 2022-03-03 NOTE — Patient Instructions (Signed)
Your Results:             Your most recent labs Goal  Total Cholesterol 265 < 200  Triglycerides 177 < 150  HDL (happy/good cholesterol) 62 > 40  LDL (lousy/bad cholesterol 171 < 70   Medication changes:  We will start the process to get Repatha covered by your insurance.  Once the prior authorization is complete, I will call/send a MyChart message to let you know and confirm pharmacy information.   You will take one injection every 14 days.    Lab orders:  We want to repeat labs after 2-3 months.  We will send you a lab order to remind you once we get closer to that time.    Patient Assistance:  The [employer] foundation offers assistance to help pay for medication copays.  They will cover copays for all cholesterol lowering meds, including statins, fibrates, omega-3 oils, ezetimibe, Repatha, Praluent, Nexletol, Nexlizet.  The cards are usually good for $2,500 or 12 months, whichever comes first. Go to healthwellfoundation.org Click on "Apply Now" Answer questions as to whom is applying (patient or representative) Your disease fund will be "hypercholesterolemia - Medicare access" Select the cholesterol medication you need assistance with (Repatha, Praluent, Nexlizet...) They will ask question about qualifying diagnosis - you can mark "yes"; and do you have insurance coverage.   When they ask what type of assistance you are interested in - "copay assistance" When you submit, the approval is usually within minutes.  You will need to print the card information from the site You will need to show this information to your pharmacy, they will bill your Medicare Part D plan first -then bill [employer] --for the copay.   You can also call them at [phone], although the hold times can be quite long.   Thank you for choosing [facility]  High Triglycerides Eating Plan Triglycerides are a type of fat in the blood. High levels of triglycerides can increase your risk of heart disease and  stroke. If your triglyceride levels are high, choosing the right foods can help lower your triglycerides and keep your heart healthy. Work with your health care provider or a dietitian to develop an eating plan that is right for you. What are tips for following this plan? General guidelines  Lose weight, if you are overweight. For most people, losing 5-10 lb (2-5 kg) helps lower triglyceride levels. A weight-loss plan may include: 30 minutes of exercise at least 5 days a week. Reducing the amount of calories, sugar, and fat you eat. Eat a wide variety of fresh fruits, vegetables, and whole grains. These foods are high in fiber. Eat foods that contain healthy fats, such as fatty fish, nuts, seeds, and olive oil. Avoid foods that are high in added sugar, added salt (sodium), and saturated fat. Avoid low-fiber, refined carbohydrates such as white bread, crackers, noodles, and white rice. Avoid foods with trans fats or partially hydrogenated oils, such as fried foods or stick margarine. If you drink alcohol: Limit how much you have to: 0-1 drink a day for women who are not pregnant. 0-2 drinks a day for men. Your health care provider may recommend that you drink less than these amounts depending on your overall health. Know how much alcohol is in a drink. In the [place] one drink equals one 12 oz bottle of beer (355 mL), one 5 oz glass of wine (148 mL), or one 1 oz glass of hard liquor (44 mL). Reading food  labels Check food labels for: The amount of saturated fat. Choose foods with no or very little saturated fat (less than 2 g). The amount of trans fat. Choose foods with no transfat. The amount of cholesterol. Choose foods that are low in cholesterol. The amount of sodium. Choose foods with less than 140 milligrams (mg) per serving. Shopping Buy dairy products labeled as nonfat (skim) or low-fat (1%). Avoid buying processed or prepackaged foods. These are often high in added sugar, sodium, and  fat. Cooking Choose healthy fats when cooking, such as olive oil, avocado oil, or canola oil. Cook foods using lower fat methods, such as baking, broiling, boiling, or grilling. Make your own sauces, dressings, and marinades when possible, instead of buying them. Store-bought sauces, dressings, and marinades are often high in sodium and sugar. Meal planning Eat more home-cooked food and less restaurant, buffet, and fast food. Eat fatty fish at least 2 times each week. Examples of fatty fish include salmon, trout, sardines, mackerel, tuna, and herring. If you eat whole eggs, do not eat more than 4 egg yolks per week. What foods should I eat? Fruits All fresh, canned (in natural juice), or frozen fruits. Vegetables Fresh or frozen vegetables. Low-sodium canned vegetables. Grains Whole wheat or whole grain breads, crackers, cereals, and pasta. Unsweetened oatmeal. Bulgur. Barley. Quinoa. Brown rice. Whole wheat flour tortillas. Meats and other proteins Skinless chicken or [place]. Ground chicken or [place]. Lean cuts of pork, trimmed of fat. Fish and seafood, especially salmon, trout, and herring. Egg whites. Dried beans, peas, or lentils. Unsalted nuts or seeds. Unsalted canned beans. Natural peanut or almond butter or other nut butters. Dairy Low-fat dairy products. Skim or low-fat (1%) milk. Reduced fat (2%) and low-sodium cheese. Low-fat ricotta cheese. Low-fat cottage cheese. Plain, low-fat yogurt. Fats and oils Tub margarine without trans fats. Light or reduced-fat mayonnaise. Light or reduced-fat salad dressings. Avocado. Safflower, olive, sunflower, soybean, and canola oils. The items listed above may not be a complete list of recommended foods and beverages. Talk with your dietitian about what dietary choices are best for you. What foods should I avoid? Fruits Sweetened dried fruit. Canned fruit in syrup. Fruit juice. Vegetables Creamed or fried vegetables. Vegetables in a cheese  sauce. Grains White bread. White (regular) pasta. White rice. Cornbread. Bagels. Pastries. Crackers that contain trans fat. Meats and other proteins Fatty cuts of meat. Ribs. Chicken wings. Berniece Salines. Sausage. Bologna. Salami. Chitterlings. Fatback. Hot dogs. Bratwurst. Packaged lunch meats. Dairy Whole or reduced-fat (2%) milk. Half-and-half. Cream cheese. Full-fat or sweetened yogurt. Full-fat cheese. Nondairy creamers. Whipped toppings. Processed cheese or cheese spreads. Cheese curds. Fats and oils Butter. Stick margarine. Lard. Shortening. Ghee. Bacon fat. Tropical oils, such as coconut, palm kernel, or palm oils. Beverages Alcohol. Sweetened drinks, such as soda, lemonade, fruit drinks, or punches. Sweets and desserts Corn syrup. Sugars. Honey. Molasses. Candy. Jam and jelly. Syrup. Sweetened cereals. Cookies. Pies. Cakes. Donuts. Muffins. Ice cream. Condiments Store-bought sauces, dressings, and marinades that are high in sugar, such as ketchup and barbecue sauce. The items listed above may not be a complete list of foods and beverages you should avoid. Talk with your dietitian about what dietary choices are best for you. Summary High levels of triglycerides can increase the risk of heart disease and stroke. Choosing the right foods can help lower your triglycerides. Eat plenty of fresh fruits, vegetables, and whole grains. Choose low-fat dairy and lean meats. Eat fatty fish at least twice a week. Avoid processed and  prepackaged foods with added sugar, sodium, saturated fat, and trans fat. If you need suggestions or have questions about what types of food are good for you, talk with your health care provider or a dietitian. This information is not intended to replace advice given to you by your health care provider. Make sure you discuss any questions you have with your health care provider. Document Revised: 06/14/2020 Document Reviewed: 06/14/2020 Elsevier Patient Education  [place].

## 2022-03-04 ENCOUNTER — Telehealth: Payer: Self-pay

## 2022-03-04 ENCOUNTER — Other Ambulatory Visit (HOSPITAL_COMMUNITY): Payer: Self-pay

## 2022-03-04 ENCOUNTER — Encounter: Payer: Self-pay | Admitting: Pharmacist Clinician (PhC)/ Clinical Pharmacy Specialist

## 2022-03-04 NOTE — Telephone Encounter (Signed)
Pharmacy Patient Advocate Encounter  Prior Authorization for Repatha 140mg /ml has been approved.    key# [id number] Effective dates: 1.1.24 through 12.31.24

## 2022-03-04 NOTE — Assessment & Plan Note (Signed)
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  Discussed mechanisms of action, dosing, side effects, potential decreases in LDL cholesterol and costs.  Also reviewed potential options for patient assistance.  Plan: Patient agreeable to starting Repatha 140 mg q14d Repeat labs after:  3 months Lipid Liver function Patient was given information on [place] grant/signed up for [employer] while in office today.

## 2022-03-10 NOTE — Telephone Encounter (Signed)
Check with pharmacy about price - if medicaid pricing is in effect?  Or does she need [employer]

## 2022-03-16 MED ORDER — REPATHA SURECLICK 140 MG/ML ~~LOC~~ SOAJ: 140.0000 mg | SUBCUTANEOUS | 12 refills | Status: DC

## 2022-03-16 NOTE — Telephone Encounter (Signed)
Contacted pharmacy, prescription is $11/month.  Pt states ability to pay this.

## 2022-03-18 ENCOUNTER — Ambulatory Visit (HOSPITAL_COMMUNITY): Payer: Medicare Other | Attending: Cardiology

## 2022-03-18 DIAGNOSIS — I350 Nonrheumatic aortic (valve) stenosis: Secondary | ICD-10-CM | POA: Insufficient documentation

## 2022-03-18 DIAGNOSIS — E785 Hyperlipidemia, unspecified: Secondary | ICD-10-CM | POA: Insufficient documentation

## 2022-03-18 DIAGNOSIS — I2510 Atherosclerotic heart disease of native coronary artery without angina pectoris: Secondary | ICD-10-CM | POA: Diagnosis present

## 2022-03-18 DIAGNOSIS — I10 Essential (primary) hypertension: Secondary | ICD-10-CM

## 2022-03-18 LAB — ECHOCARDIOGRAM COMPLETE
AR max vel: 0.97 cm2
AV Area VTI: 0.91 cm2
AV Area mean vel: 0.83 cm2
AV Mean grad: 16 mmHg
AV Peak grad: 29.5 mmHg
Ao pk vel: 2.72 m/s
Area-P 1/2: 2.69 cm2
S' Lateral: 2.7 cm

## 2022-03-19 ENCOUNTER — Other Ambulatory Visit: Payer: Self-pay | Admitting: *Deleted

## 2022-03-19 DIAGNOSIS — I350 Nonrheumatic aortic (valve) stenosis: Secondary | ICD-10-CM

## 2023-03-05 ENCOUNTER — Encounter: Payer: Self-pay | Admitting: Cardiovascular Disease

## 2023-03-05 ENCOUNTER — Ambulatory Visit: Payer: Medicare Other | Attending: Cardiovascular Disease | Admitting: Cardiovascular Disease

## 2023-03-05 VITALS — BP 128/82 | HR 81 | Ht 62.0 in | Wt 182.4 lb

## 2023-03-05 DIAGNOSIS — I350 Nonrheumatic aortic (valve) stenosis: Secondary | ICD-10-CM | POA: Diagnosis not present

## 2023-03-05 DIAGNOSIS — I2510 Atherosclerotic heart disease of native coronary artery without angina pectoris: Secondary | ICD-10-CM | POA: Insufficient documentation

## 2023-03-05 DIAGNOSIS — E7801 Familial hypercholesterolemia: Secondary | ICD-10-CM | POA: Insufficient documentation

## 2023-03-05 DIAGNOSIS — I10 Essential (primary) hypertension: Secondary | ICD-10-CM | POA: Insufficient documentation

## 2023-03-05 NOTE — Patient Instructions (Signed)
Medication Instructions:  Looking into starting on Leqvio- the Pharmacist will call you with further information *If you need a refill on your cardiac medications before your next appointment, please call your pharmacy*  Follow-Up: At [facility], you and your health needs are our priority.  As part of our continuing mission to provide you with exceptional heart care, we have created designated Provider Care Teams.  These Care Teams include your primary Cardiologist (physician) and Advanced Practice Providers (APPs -  Physician Assistants and Nurse Practitioners) who all work together to provide you with the care you need, when you need it.  We recommend signing up for the patient portal called "MyChart".  Sign up information is provided on this After Visit Summary.  MyChart is used to connect with patients for Virtual Visits (Telemedicine).  Patients are able to view lab/test results, encounter notes, upcoming appointments, etc.  Non-urgent messages can be sent to your provider as well.   To learn more about what you can do with MyChart, go to [email or web address].    Your next appointment:   1 year(s)  Provider:   Thurmon Fair, MD

## 2023-03-05 NOTE — Progress Notes (Unsigned)
Cardiology Office Note:    Date:  03/06/2023   ID:  Hailey Shields, DOB [birth year], MRN [id number]  PCP:  Hailey Cordial, MD   [facility] Providers Cardiologist:  Hailey Fair, MD     Referring MD: Hailey Cordial, MD   Chief Complaint  Patient presents with   Coronary Artery Disease    History of Present Illness:    Hailey Shields is a 78 y.o. 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  She is not taking any lipid-lowering therapy.  She developed myopathy with multiple statins and with ezetimibe.  We prescribed PCSK9 inhibitors last year, but she never started treatment.  She expresses concern regarding self-administered injections and about side effects in general.  Labs performed in May showed total cholesterol 280, HDL 75, LDL 190, triglycerides 90.  She does not have diabetes mellitus.  She does not smoke.  Her blood pressure is well-controlled  She still lives alone, with 2 cats and has no difficulty managing household chores. The patient specifically denies any chest pain at rest or with exertion, dyspnea at rest or with exertion, orthopnea, paroxysmal nocturnal dyspnea, syncope, palpitations, focal neurological deficits, intermittent claudication, lower extremity edema, unexplained weight gain, cough, hemoptysis or wheezing.  Her brother Hailey Shields is also my patient.  Past Medical History:  Diagnosis Date   CAD (coronary artery disease)    a. 03/2017 Inf STEMI/PCI: LM 20d, LAD 30ost, 60p, D1 50ost, LCX 30ost, RCA 100p (3.5x38 Resolute Onyx DES), 90m, RPDA fills vial L->R collats from 2nd septal, EF 55-65%, 1+MR.   Carpal tunnel syndrome of right wrist    effects thumb and first two fingers   Diastolic dysfunction    a. 03/2017 Echo: Gr2 DD. EF 50-55%.   History of chemotherapy 2004   COMPLETED   History of DVT of lower extremity 08/2015   post surgery  LEFT TOTAL KNEE   History of exercise stress test 03-24-2016   dr Hailey Shields   per note Intermediate risk secondary to poor exercise effort, no ecg ischemia; needs nuclear stress test   History of kidney stones    History of ovarian cancer 2003--- per pt no recurrence   s/p  TAH w/ BSO and completed chemotherapy in 2004 , no radiation   History of pulmonary embolus (PE) 08/2015   a. 08/2015 s/p L TKA-->DVT and PE.   History of transient ischemic attack (TIA) 2004   "just after chemotherapy"   Hypertension    Nonrheumatic aortic (valve) stenosis    per last echo 04-06-2016  mild stenosis w/ valve area 1.69cm^2   OA (osteoarthritis)    a. s/p bilat TKA (L 2017, R 2019).   Valvular heart disease    a. 03/2017 Echo: EF 50-55%, basal to mid inferior HK, Gr2 DD, mild AS/MR, mildly dil LA/RV.   Wears glasses     Past Surgical History:  Procedure Laterality Date   CORONARY/GRAFT ACUTE MI REVASCULARIZATION N/A 04/14/2017   Procedure: Coronary/Graft Acute MI Revascularization;  Surgeon: Hailey Kendall, MD;  Location: [facility];  Service: Cardiovascular;  Laterality: N/A;   INSERTION OF MESH N/A 11/27/2014   Procedure: INSERTION OF MESH;  Surgeon: Hailey Level, MD;  Location: [facility];  Service: General;  Laterality: N/A;   LAPAROTOMY N/A 11/27/2014   Procedure: EXPLORATORY LAPAROTOMY;  Surgeon: Hailey Cooler  Gerkin, MD;  Location: [facility];  Service: General;  Laterality: N/A;   LEFT HEART CATH AND CORONARY ANGIOGRAPHY N/A 04/14/2017   Procedure: LEFT HEART CATH AND CORONARY ANGIOGRAPHY;  Surgeon: Hailey Kendall, MD;  Location: [facility];  Service: Cardiovascular;  Laterality: N/A;   LUMBAR FUSION  2009   TONSILLECTOMY  age 9   TOTAL ABDOMINAL HYSTERECTOMY W/ BILATERAL SALPINGOOPHORECTOMY  2003   AND CHOLECYSTECTOMY/  MULTIPLE LYMPH NODE DISSECTIONS   TOTAL KNEE ARTHROPLASTY Left 08/27/2015   Procedure: LEFT TOTAL  KNEE ARTHROPLASTY;  Surgeon: Hailey Romans, MD;  Location: [facility];  Service: Orthopedics;  Laterality: Left;   TOTAL KNEE ARTHROPLASTY Right 04/06/2017   Procedure: RIGHT TOTAL KNEE ARTHROPLASTY;  Surgeon: Hailey Romans, MD;  Location: [facility];  Service: Orthopedics;  Laterality: Right;  70 mins   TRANSTHORACIC ECHOCARDIOGRAM  04-06-2016  dr Hailey Shields   ef 55-60%,  grade 1 diastolic dysfunction/  mild AV stenosis (valve area 1.69cm^2)/ trivial MR/  mild LAE/  mild TR   UMBILICAL HERNIA REPAIR  2001  APPROX.   VENTRAL HERNIA REPAIR N/A 11/27/2014   Procedure: HERNIA REPAIR VENTRAL ADULT;  Surgeon: Hailey Level, MD;  Location: [facility];  Service: General;  Laterality: N/A;   VENTRAL HERNIA REPAIR  2008 APPROX.    Current Medications: Current Meds  Medication Sig   aspirin EC 81 MG tablet Take 81 mg by mouth daily.   chlorpheniramine (CHLOR-TRIMETON) 4 MG tablet Take 4 mg by mouth daily.   clotrimazole-betamethasone (LOTRISONE) cream Apply 1 Application topically 2 (two) times daily as needed.   lisinopril-hydrochlorothiazide (ZESTORETIC) 20-12.5 MG tablet Take 1 tablet by mouth daily.     Allergies:   Codeine, Hydrocodone, Oxycodone, Prednisone, Statins, Tramadol, and Zithromax [azithromycin]   Social History   Socioeconomic History   Marital status: Widowed    Spouse name: Not on file   Number of children: Not on file   Years of education: Not on file   Highest education Shields: Not on file  Occupational History   Not on file  Tobacco Use   Smoking status: Never   Smokeless tobacco: Never  Vaping Use   Vaping status: Never Used  Substance and Sexual Activity   Alcohol use: No   Drug use: No   Sexual activity: Not on file  Other Topics Concern   Not on file  Social History Narrative   Not on file   Social Drivers of Health   Financial Resource Strain: Medium Risk (07/14/2022)   Received from [facility]   Overall Financial Resource Strain (CARDIA)    Difficulty of Paying  Living Expenses: Somewhat hard  Food Insecurity: Food Insecurity Present (07/14/2022)   Received from [facility]   Hunger Vital Sign    Worried About Running Out of Food in the Last Year: Never true    Ran Out of Food in the Last Year: Sometimes true  Transportation Needs: No Transportation Needs (07/14/2022)   [facility] - Transportation    Lack of Transportation (Medical): No    Lack of Transportation (Non-Medical): No  Physical Activity: Insufficiently Active (07/14/2022)   Received from [facility]   Exercise Vital Sign    Days of Exercise per Week: 2 days    Minutes of Exercise per Session: 60 min  Stress: No Stress Concern Present (07/14/2022)   Received from [facility] - Occupational Stress Questionnaire    Feeling of Stress :  Only a little  Social Connections: Moderately Integrated (07/14/2022)   Received from [facility]   Social Network    How would you rate your social network (family, work, friends)?: Adequate participation with social networks     Family History: The patient's family history includes Heart attack in her brother. (Had CABG)  ROS:   Please see the history of present illness.     All other systems reviewed and are negative.  EKGs/Labs/Other Studies Reviewed:    The following studies were reviewed today:  CATH 2019  Conclusions: Inferior STEMI with thrombotic occlusion of the proximal/mid RCA. Mild to moderate, non-critical disease involving the LMCA, LAD, and LCx. Basal and mid inferior akinesis with otherwise hyperdynamic LV contraction. LVEF 55-65%. Normal left ventricular filling pressure. Mild aortic stenosis (peak-to-peak gradient 15 mmHg). Successful primary PCI to the proximal and mid RCA using a Resolute Onyx 3.5 x 38 mm drug-eluting stent with 0% residual stenosis and TIMI-3 flow.   Recommendations: Dual antiplatelet therapy for for at least 12 months, ideally  with aspirin and ticagrelor. If apixaban needs to be restarted for DVT prophylaxis following knee surgery earlier this month, ticagrelor should be transitioned to clopidogrel and triple therapy (aspirin, clopidogrel, and apixaban) continued until it is felt safe to discontinue apixaban from a postoperative standpoint. Aggressive secondary prevention. Consider retrial of statins versus PCSK9 inhibitor therapy. Obtain transthoracic echocardiogram. Medical therapy for non-critical disease involving the LAD. If the patient develops angina, functional study or FFR could be considered to assess hemodynamic significance of the lesion.  ECHO 03/19/2022   1. Left ventricular ejection fraction, by estimation, is 60 to 65%. The  left ventricle has normal function. The left ventricle has no regional  wall motion abnormalities. There is mild left ventricular hypertrophy.  Left ventricular diastolic parameters  are consistent with Grade I diastolic dysfunction (impaired relaxation).   2. Right ventricular systolic function is normal. The right ventricular  size is normal.   3. The mitral valve is normal in structure. Trivial mitral valve  regurgitation. No evidence of mitral stenosis.   4. The aortic valve is calcified. There is moderate calcification of the  aortic valve. There is moderate thickening of the aortic valve. Aortic  valve regurgitation is not visualized. Mild aortic valve stenosis. Aortic  valve mean gradient measures 16.0  mmHg. Aortic valve Vmax measures 2.72 m/s.   5. The inferior vena cava is normal in size with greater than 50%  respiratory variability, suggesting right atrial pressure of 3 mmHg.   EKG:    EKG Interpretation Date/Time:  Friday March 05 2023 14:48:32 EST Ventricular Rate:  81 PR Interval:  170 QRS Duration:  90 QT Interval:  356 QTC Calculation: 413 R Axis:   -58  Text Interpretation: Sinus rhythm with Premature supraventricular complexes Left axis deviation  Pulmonary disease pattern Moderate voltage criteria for LVH, may be normal variant ( R in aVL , Cornell product ) When compared with ECG of 14-Apr-2017 05:32, Premature supraventricular complexes are now Present Confirmed by Abrielle Finck [phone]) on 03/05/2023 3:02:12 PM         Recent Labs: No results found for requested labs within last 365 days.  Recent Lipid Panel    Component Value Date/Time   CHOL 220 (H) 09/30/2017 0844   TRIG 85 09/30/2017 0844   HDL 65 09/30/2017 0844   CHOLHDL 3.4 09/30/2017 0844   VLDL 17 09/30/2017 0844   LDLCALC 138 (H) 09/30/2017 0844   06/27/2021 total cholesterol 265,  triglycerides 177, HDL 62 and calculated LDL 952  07/14/2022 total cholesterol 280, HDL 75, LDL 190, triglycerides 90  Risk Assessment/Calculations:                Physical Exam:    VS:  BP 128/82 (BP Location: Left Arm, Patient Position: Sitting)   Pulse 81   Ht 5\' 2"  (1.575 m)   Wt 182 lb 6.4 oz (82.7 kg)   SpO2 94%   BMI 33.36 kg/m     Wt Readings from Last 3 Encounters:  03/05/23 182 lb 6.4 oz (82.7 kg)  02/23/22 184 lb 12.8 oz (83.8 kg)  11/03/18 195 lb (88.5 kg)     GEN: Mildly obese well nourished, well developed in no acute distress HEENT: Normal NECK: No JVD; carotid pulses are brisk but there are bilateral carotid bruits that probably radiate from the chest LYMPHATICS: No lymphadenopathy CARDIAC: RRR, normal S1 and distinct S2, 3/6 early to mid peaking aortic ejection murmur, no diastolic murmurs, rubs, gallops.  All distal pulses are normal including bilateral DP/PT/radial/ulnar RESPIRATORY:  Clear to auscultation without rales, wheezing or rhonchi  ABDOMEN: Soft, non-tender, non-distended MUSCULOSKELETAL:  No edema; No deformity  SKIN: Warm and dry NEUROLOGIC:  Alert and oriented x 3 PSYCHIATRIC:  Normal affect   ASSESSMENT:    1. Coronary artery disease involving native coronary artery of native heart without angina pectoris   2. Heterozygous  familial hypercholesterolemia   3. Nonrheumatic aortic valve stenosis   4. Essential hypertension    PLAN:    In order of problems listed above:  CAD: Remains asymptomatic.  Most recent echo shows no evidence of residual wall motion abnormalities.  On aspirin.  I am very concerned that we have not provided adequate lipid-lowering therapy.  If she is reluctant to do self-administered PCSK9 inhibitor injections, maybe we can sign her up for Leqvio.  Asked our pharmacy team to look into that today.  She still expresses some reticence.  Tried to impress on her the importance of lipid lowering to prevent future heart attacks. HLP: Labs are consistent with familial heterozygous hypercholesterolemia.  She developed statin myopathy and also has intolerance to ezetimibe.   AS: Echo shows only mild aortic stenosis with minimal worsening since 2019.  For now, will plan on rechecking it every 3-5 years unless she develops exertional symptoms (exertional angina/syncope/dyspnea). HTN: Adequate control           Medication Adjustments/Labs and Tests Ordered: Current medicines are reviewed at length with the patient today.  Concerns regarding medicines are outlined above.  Orders Placed This Encounter  Procedures   EKG 12-Lead   No orders of the defined types were placed in this encounter.   Patient Instructions  Medication Instructions:  Looking into starting on Leqvio- the Pharmacist will call you with further information *If you need a refill on your cardiac medications before your next appointment, please call your pharmacy*  Follow-Up: At [facility], you and your health needs are our priority.  As part of our continuing mission to provide you with exceptional heart care, we have created designated Provider Care Teams.  These Care Teams include your primary Cardiologist (physician) and Advanced Practice Providers (APPs -  Physician Assistants and Nurse Practitioners) who all work  together to provide you with the care you need, when you need it.  We recommend signing up for the patient portal called "MyChart".  Sign up information is provided on this After Visit Summary.  MyChart is  used to connect with patients for Virtual Visits (Telemedicine).  Patients are able to view lab/test results, encounter notes, upcoming appointments, etc.  Non-urgent messages can be sent to your provider as well.   To learn more about what you can do with MyChart, go to [email or web address].    Your next appointment:   1 year(s)  Provider:   Thurmon Fair, MD             Signed, Hailey Fair, MD  03/06/2023 5:17 PM     [facility]

## 2023-03-06 ENCOUNTER — Encounter: Payer: Self-pay | Admitting: Cardiovascular Disease

## 2023-03-12 ENCOUNTER — Telehealth: Payer: Self-pay | Admitting: Pharmacist

## 2023-03-12 NOTE — Telephone Encounter (Signed)
Faxed Leqvio start form to manufacturer along with insurance information.

## 2023-03-22 ENCOUNTER — Other Ambulatory Visit: Payer: Self-pay | Admitting: Pharmacist

## 2023-03-23 ENCOUNTER — Telehealth: Payer: Self-pay

## 2023-03-23 NOTE — Telephone Encounter (Signed)
 Auth Submission: NO AUTH NEEDED Site of care: Site of care: [facility] Payer: Medicare A/B with Providers mutual supplement Medication & CPT/J Code(s) submitted: Leqvio (Inclisiran) J1306 Route of submission (phone, fax, portal):  Phone # Fax # Auth type: Buy/Bill PB Units/visits requested: 284mg  x 3 doses Reference number:  Approval from: 03/23/23 to 03/18/24

## 2024-03-06 ENCOUNTER — Ambulatory Visit: Admitting: Cardiovascular Disease

## 2024-05-26 ENCOUNTER — Ambulatory Visit: Admitting: Cardiovascular Disease
